# Patient Record
Sex: Male | Born: 1945 | Race: Black or African American | Hispanic: No | Marital: Married | Smoking: Former smoker
Health system: Southern US, Community
[De-identification: ages and names within clinical notes are randomized; demographics above are authoritative.]

## PROBLEM LIST (undated history)

## (undated) DIAGNOSIS — E119 Type 2 diabetes mellitus without complications: Secondary | ICD-10-CM

## (undated) DIAGNOSIS — E785 Hyperlipidemia, unspecified: Secondary | ICD-10-CM

## (undated) DIAGNOSIS — I255 Ischemic cardiomyopathy: Secondary | ICD-10-CM

## (undated) DIAGNOSIS — I10 Essential (primary) hypertension: Secondary | ICD-10-CM

## (undated) DIAGNOSIS — Z95810 Presence of automatic (implantable) cardiac defibrillator: Secondary | ICD-10-CM

## (undated) DIAGNOSIS — I2510 Atherosclerotic heart disease of native coronary artery without angina pectoris: Secondary | ICD-10-CM

## (undated) HISTORY — DX: Atherosclerotic heart disease of native coronary artery without angina pectoris: I25.10

## (undated) HISTORY — DX: Hyperlipidemia, unspecified: E78.5

## (undated) HISTORY — DX: Type 2 diabetes mellitus without complications: E11.9

---

## 1994-02-10 HISTORY — PX: CORONARY ARTERY BYPASS GRAFT: SHX141

## 1998-11-14 ENCOUNTER — Ambulatory Visit (HOSPITAL_COMMUNITY): Admission: RE | Admit: 1998-11-14 | Discharge: 1998-11-14 | Payer: Self-pay | Admitting: *Deleted

## 1998-11-14 ENCOUNTER — Encounter: Payer: Self-pay | Admitting: *Deleted

## 1998-11-14 HISTORY — PX: CARDIAC CATHETERIZATION: SHX172

## 1999-08-27 ENCOUNTER — Emergency Department (HOSPITAL_COMMUNITY): Admission: EM | Admit: 1999-08-27 | Discharge: 1999-08-27 | Payer: Self-pay | Admitting: Emergency Medicine

## 1999-08-28 ENCOUNTER — Encounter: Payer: Self-pay | Admitting: Emergency Medicine

## 2003-04-25 ENCOUNTER — Ambulatory Visit (HOSPITAL_COMMUNITY): Admission: RE | Admit: 2003-04-25 | Discharge: 2003-04-25 | Payer: Self-pay | Admitting: Gastroenterology

## 2007-06-08 HISTORY — PX: CARDIAC DEFIBRILLATOR PLACEMENT: SHX171

## 2009-09-22 ENCOUNTER — Inpatient Hospital Stay (HOSPITAL_COMMUNITY): Admission: EM | Admit: 2009-09-22 | Discharge: 2009-09-28 | Payer: Self-pay

## 2009-09-25 ENCOUNTER — Encounter (INDEPENDENT_AMBULATORY_CARE_PROVIDER_SITE_OTHER): Payer: Self-pay

## 2010-04-25 LAB — COMPREHENSIVE METABOLIC PANEL
ALT: 117 U/L — ABNORMAL HIGH (ref 0–53)
ALT: 131 U/L — ABNORMAL HIGH (ref 0–53)
AST: 43 U/L — ABNORMAL HIGH (ref 0–37)
Albumin: 2.6 g/dL — ABNORMAL LOW (ref 3.5–5.2)
Albumin: 2.8 g/dL — ABNORMAL LOW (ref 3.5–5.2)
Albumin: 3 g/dL — ABNORMAL LOW (ref 3.5–5.2)
Alkaline Phosphatase: 62 U/L (ref 39–117)
BUN: 13 mg/dL (ref 6–23)
BUN: 2 mg/dL — ABNORMAL LOW (ref 6–23)
BUN: 3 mg/dL — ABNORMAL LOW (ref 6–23)
BUN: 6 mg/dL (ref 6–23)
BUN: 9 mg/dL (ref 6–23)
CO2: 28 mEq/L (ref 19–32)
CO2: 30 mEq/L (ref 19–32)
Calcium: 7.7 mg/dL — ABNORMAL LOW (ref 8.4–10.5)
Calcium: 8.1 mg/dL — ABNORMAL LOW (ref 8.4–10.5)
Calcium: 8.1 mg/dL — ABNORMAL LOW (ref 8.4–10.5)
Calcium: 8.4 mg/dL (ref 8.4–10.5)
Chloride: 106 mEq/L (ref 96–112)
Chloride: 99 mEq/L (ref 96–112)
Creatinine, Ser: 0.82 mg/dL (ref 0.4–1.5)
Creatinine, Ser: 1.42 mg/dL (ref 0.4–1.5)
GFR calc Af Amer: >60 mL/min (ref >60)
GFR calc non Af Amer: 51 mL/min — ABNORMAL LOW (ref >60)
GFR calc non Af Amer: >60 mL/min (ref >60)
GFR calc non Af Amer: >60 mL/min (ref >60)
GFR calc non Af Amer: >60 mL/min (ref >60)
Glucose, Bld: 106 mg/dL — ABNORMAL HIGH (ref 70–99)
Glucose, Bld: 94 mg/dL (ref 70–99)
Glucose, Bld: 95 mg/dL (ref 70–99)
Potassium: 4.6 mEq/L (ref 3.5–5.1)
Sodium: 137 mEq/L (ref 135–145)
Sodium: 138 mEq/L (ref 135–145)
Sodium: 139 mEq/L (ref 135–145)
Total Bilirubin: 1.2 mg/dL (ref 0.3–1.2)
Total Bilirubin: 1.5 mg/dL — ABNORMAL HIGH (ref 0.3–1.2)
Total Protein: 5.9 g/dL — ABNORMAL LOW (ref 6.0–8.3)

## 2010-04-25 LAB — CBC
HCT: 36.2 % — ABNORMAL LOW (ref 39.0–52.0)
HCT: 36.9 % — ABNORMAL LOW (ref 39.0–52.0)
HCT: 38.8 % — ABNORMAL LOW (ref 39.0–52.0)
HCT: 41.2 % (ref 39.0–52.0)
Hemoglobin: 12.3 g/dL — ABNORMAL LOW (ref 13.0–17.0)
Hemoglobin: 12.6 g/dL — ABNORMAL LOW (ref 13.0–17.0)
Hemoglobin: 12.6 g/dL — ABNORMAL LOW (ref 13.0–17.0)
Hemoglobin: 13.8 g/dL (ref 13.0–17.0)
MCH: 30.6 pg (ref 26.0–34.0)
MCH: 31.3 pg (ref 26.0–34.0)
MCH: 31.7 pg (ref 26.0–34.0)
MCHC: 33.5 g/dL (ref 30.0–36.0)
MCHC: 34 g/dL (ref 30.0–36.0)
MCHC: 34.3 g/dL (ref 30.0–36.0)
MCV: 90 fL (ref 78.0–100.0)
MCV: 90.9 fL (ref 78.0–100.0)
MCV: 92.8 fL (ref 78.0–100.0)
Platelets: 111 10*3/uL — ABNORMAL LOW (ref 150–400)
Platelets: 118 10*3/uL — ABNORMAL LOW (ref 150–400)
Platelets: 122 10*3/uL — ABNORMAL LOW (ref 150–400)
RBC: 3.97 MIL/uL — ABNORMAL LOW (ref 4.22–5.81)
RBC: 4.4 MIL/uL (ref 4.22–5.81)
RDW: 13.3 % (ref 11.5–15.5)
RDW: 13.3 % (ref 11.5–15.5)
WBC: 6.1 10*3/uL (ref 4.0–10.5)

## 2010-04-25 LAB — DIFFERENTIAL
Basophils Absolute: 0 10*3/uL (ref 0.0–0.1)
Basophils Absolute: 0 10*3/uL (ref 0.0–0.1)
Basophils Absolute: 0 10*3/uL (ref 0.0–0.1)
Basophils Relative: 0 % (ref 0–1)
Basophils Relative: 0 % (ref 0–1)
Basophils Relative: 0 % (ref 0–1)
Eosinophils Absolute: 0.1 10*3/uL (ref 0.0–0.7)
Eosinophils Relative: 2 % (ref 0–5)
Eosinophils Relative: 3 % (ref 0–5)
Lymphocytes Relative: 10 % — ABNORMAL LOW (ref 12–46)
Lymphocytes Relative: 17 % (ref 12–46)
Lymphocytes Relative: 19 % (ref 12–46)
Lymphocytes Relative: 21 % (ref 12–46)
Lymphs Abs: 0.6 10*3/uL — ABNORMAL LOW (ref 0.7–4.0)
Lymphs Abs: 1 10*3/uL (ref 0.7–4.0)
Lymphs Abs: 1 10*3/uL (ref 0.7–4.0)
Monocytes Absolute: 0.5 10*3/uL (ref 0.1–1.0)
Monocytes Absolute: 0.6 10*3/uL (ref 0.1–1.0)
Monocytes Relative: 10 % (ref 3–12)
Monocytes Relative: 12 % (ref 3–12)
Neutro Abs: 2.7 10*3/uL (ref 1.7–7.7)
Neutro Abs: 4 10*3/uL (ref 1.7–7.7)
Neutro Abs: 4.4 10*3/uL (ref 1.7–7.7)
Neutro Abs: 4.4 10*3/uL (ref 1.7–7.7)
Neutrophils Relative %: 70 % (ref 43–77)
Neutrophils Relative %: 72 % (ref 43–77)
Neutrophils Relative %: 81 % — ABNORMAL HIGH (ref 43–77)

## 2010-04-25 LAB — GLUCOSE, CAPILLARY
Glucose-Capillary: 100 mg/dL — ABNORMAL HIGH (ref 70–99)
Glucose-Capillary: 101 mg/dL — ABNORMAL HIGH (ref 70–99)
Glucose-Capillary: 107 mg/dL — ABNORMAL HIGH (ref 70–99)
Glucose-Capillary: 109 mg/dL — ABNORMAL HIGH (ref 70–99)
Glucose-Capillary: 118 mg/dL — ABNORMAL HIGH (ref 70–99)
Glucose-Capillary: 130 mg/dL — ABNORMAL HIGH (ref 70–99)
Glucose-Capillary: 138 mg/dL — ABNORMAL HIGH (ref 70–99)
Glucose-Capillary: 86 mg/dL (ref 70–99)
Glucose-Capillary: 87 mg/dL (ref 70–99)
Glucose-Capillary: 91 mg/dL (ref 70–99)
Glucose-Capillary: 92 mg/dL (ref 70–99)
Glucose-Capillary: 93 mg/dL (ref 70–99)
Glucose-Capillary: 96 mg/dL (ref 70–99)
Glucose-Capillary: 99 mg/dL (ref 70–99)

## 2010-04-25 LAB — TYPE AND SCREEN: ABO/RH(D): O POS

## 2010-04-25 LAB — MAGNESIUM
Magnesium: 1.8 mg/dL (ref 1.5–2.5)
Magnesium: 1.8 mg/dL (ref 1.5–2.5)

## 2010-04-25 LAB — ABO/RH: ABO/RH(D): O POS

## 2010-04-26 LAB — HEPATITIS PANEL, ACUTE
HCV Ab: NEGATIVE
Hepatitis B Surface Ag: NEGATIVE

## 2010-04-26 LAB — GLUCOSE, CAPILLARY
Glucose-Capillary: 105 mg/dL — ABNORMAL HIGH (ref 70–99)
Glucose-Capillary: 126 mg/dL — ABNORMAL HIGH (ref 70–99)
Glucose-Capillary: 134 mg/dL — ABNORMAL HIGH (ref 70–99)
Glucose-Capillary: 88 mg/dL (ref 70–99)
Glucose-Capillary: 98 mg/dL (ref 70–99)

## 2010-04-26 LAB — DIFFERENTIAL
Basophils Relative: 0 % (ref 0–1)
Eosinophils Absolute: 0 10*3/uL (ref 0.0–0.7)
Eosinophils Relative: 0 % (ref 0–5)
Neutrophils Relative %: 88 % — ABNORMAL HIGH (ref 43–77)

## 2010-04-26 LAB — CULTURE, BLOOD (ROUTINE X 2)

## 2010-04-26 LAB — CBC
HCT: 41.5 % (ref 39.0–52.0)
Hemoglobin: 13.6 g/dL (ref 13.0–17.0)
MCHC: 32.8 g/dL (ref 30.0–36.0)
RBC: 4.4 MIL/uL (ref 4.22–5.81)
WBC: 7.9 10*3/uL (ref 4.0–10.5)

## 2010-04-26 LAB — COMPREHENSIVE METABOLIC PANEL
ALT: 354 U/L — ABNORMAL HIGH (ref 0–53)
AST: 196 U/L — ABNORMAL HIGH (ref 0–37)
Alkaline Phosphatase: 83 U/L (ref 39–117)
CO2: 29 mEq/L (ref 19–32)
Chloride: 101 mEq/L (ref 96–112)
GFR calc non Af Amer: 41 mL/min — ABNORMAL LOW (ref >60)
Glucose, Bld: 107 mg/dL — ABNORMAL HIGH (ref 70–99)
Potassium: 4.2 mEq/L (ref 3.5–5.1)
Sodium: 141 mEq/L (ref 135–145)

## 2010-04-26 LAB — MAGNESIUM: Magnesium: 1.7 mg/dL (ref 1.5–2.5)

## 2011-11-21 HISTORY — PX: NM MYOCAR PERF WALL MOTION: HXRAD629

## 2012-01-30 ENCOUNTER — Other Ambulatory Visit (HOSPITAL_COMMUNITY): Payer: Self-pay | Admitting: Cardiovascular Disease

## 2012-01-30 DIAGNOSIS — I509 Heart failure, unspecified: Secondary | ICD-10-CM

## 2012-01-30 DIAGNOSIS — I519 Heart disease, unspecified: Secondary | ICD-10-CM

## 2012-01-30 DIAGNOSIS — I472 Ventricular tachycardia: Secondary | ICD-10-CM

## 2012-02-10 ENCOUNTER — Ambulatory Visit (HOSPITAL_COMMUNITY)
Admission: RE | Admit: 2012-02-10 | Discharge: 2012-02-10 | Disposition: A | Discharge status: Home / Self Care | Payer: Medicare Other | Source: Ambulatory Visit | Attending: Cardiovascular Disease | Admitting: Cardiovascular Disease

## 2012-02-10 DIAGNOSIS — I4729 Other ventricular tachycardia: Secondary | ICD-10-CM | POA: Insufficient documentation

## 2012-02-10 DIAGNOSIS — I509 Heart failure, unspecified: Secondary | ICD-10-CM

## 2012-02-10 DIAGNOSIS — I519 Heart disease, unspecified: Secondary | ICD-10-CM | POA: Insufficient documentation

## 2012-02-10 DIAGNOSIS — I517 Cardiomegaly: Secondary | ICD-10-CM | POA: Insufficient documentation

## 2012-02-10 DIAGNOSIS — I472 Ventricular tachycardia, unspecified: Secondary | ICD-10-CM | POA: Insufficient documentation

## 2012-02-10 DIAGNOSIS — I079 Rheumatic tricuspid valve disease, unspecified: Secondary | ICD-10-CM | POA: Insufficient documentation

## 2012-02-10 DIAGNOSIS — I059 Rheumatic mitral valve disease, unspecified: Secondary | ICD-10-CM | POA: Insufficient documentation

## 2012-02-10 NOTE — Progress Notes (Signed)
2D Echo Performed 02/10/2012    Amina Menchaca, RCS  

## 2012-11-10 ENCOUNTER — Encounter: Payer: Self-pay | Admitting: *Deleted

## 2012-11-11 ENCOUNTER — Encounter: Payer: Self-pay | Admitting: Cardiovascular Disease

## 2012-11-11 ENCOUNTER — Ambulatory Visit (INDEPENDENT_AMBULATORY_CARE_PROVIDER_SITE_OTHER): Payer: Medicare Other | Admitting: Cardiovascular Disease

## 2012-11-11 VITALS — BP 130/70 | HR 63 | Resp 16 | Ht >= 80 in | Wt 192.8 lb

## 2012-11-11 DIAGNOSIS — I10 Essential (primary) hypertension: Secondary | ICD-10-CM

## 2012-11-11 DIAGNOSIS — I255 Ischemic cardiomyopathy: Secondary | ICD-10-CM

## 2012-11-11 DIAGNOSIS — R946 Abnormal results of thyroid function studies: Secondary | ICD-10-CM

## 2012-11-11 DIAGNOSIS — I472 Ventricular tachycardia, unspecified: Secondary | ICD-10-CM

## 2012-11-11 DIAGNOSIS — Z95810 Presence of automatic (implantable) cardiac defibrillator: Secondary | ICD-10-CM

## 2012-11-11 DIAGNOSIS — E119 Type 2 diabetes mellitus without complications: Secondary | ICD-10-CM

## 2012-11-11 DIAGNOSIS — I2510 Atherosclerotic heart disease of native coronary artery without angina pectoris: Secondary | ICD-10-CM

## 2012-11-11 DIAGNOSIS — I509 Heart failure, unspecified: Secondary | ICD-10-CM

## 2012-11-11 DIAGNOSIS — E785 Hyperlipidemia, unspecified: Secondary | ICD-10-CM

## 2012-11-11 DIAGNOSIS — I5022 Chronic systolic (congestive) heart failure: Secondary | ICD-10-CM

## 2012-11-11 DIAGNOSIS — I2589 Other forms of chronic ischemic heart disease: Secondary | ICD-10-CM

## 2012-11-11 DIAGNOSIS — R7989 Other specified abnormal findings of blood chemistry: Secondary | ICD-10-CM

## 2012-11-11 LAB — ICD DEVICE OBSERVATION
BATTERY VOLTAGE: 3.07 V
BRDY-0002RV: 40 {beats}/min
FVT: 0
PACEART VT: 0
RV LEAD IMPEDENCE ICD: 418 Ohm
RV LEAD THRESHOLD: 0.75 V
TZAT-0001SLOWVT: 2
TZAT-0011SLOWVT: 10 ms
TZAT-0013SLOWVT: 3
TZAT-0018SLOWVT: NEGATIVE
TZAT-0018SLOWVT: NEGATIVE
TZON-0003SLOWVT: 359.2 ms
TZON-0003VSLOWVT: 428.5 ms
TZON-0004SLOWVT: 16
TZON-0005SLOWVT: 12
TZST-0001SLOWVT: 5
TZST-0001SLOWVT: 6
TZST-0003SLOWVT: 35 J
TZST-0003SLOWVT: 35 J
VF: 0

## 2012-11-11 NOTE — Patient Instructions (Addendum)
Follow up with home ICD check on 02-13-2012.  Follow up with Dr.C in April 2015.

## 2012-11-11 NOTE — Progress Notes (Signed)
In office ICD interrogation. Normal device function. No changes made this session. 

## 2012-11-12 ENCOUNTER — Ambulatory Visit: Payer: Medicare Other | Admitting: Cardiovascular Disease

## 2012-11-14 DIAGNOSIS — I5022 Chronic systolic (congestive) heart failure: Secondary | ICD-10-CM | POA: Insufficient documentation

## 2012-11-14 DIAGNOSIS — E119 Type 2 diabetes mellitus without complications: Secondary | ICD-10-CM | POA: Insufficient documentation

## 2012-11-14 DIAGNOSIS — Z95810 Presence of automatic (implantable) cardiac defibrillator: Secondary | ICD-10-CM | POA: Insufficient documentation

## 2012-11-14 DIAGNOSIS — I10 Essential (primary) hypertension: Secondary | ICD-10-CM | POA: Insufficient documentation

## 2012-11-14 DIAGNOSIS — I2510 Atherosclerotic heart disease of native coronary artery without angina pectoris: Secondary | ICD-10-CM | POA: Insufficient documentation

## 2012-11-14 DIAGNOSIS — I255 Ischemic cardiomyopathy: Secondary | ICD-10-CM | POA: Insufficient documentation

## 2012-11-14 DIAGNOSIS — R7989 Other specified abnormal findings of blood chemistry: Secondary | ICD-10-CM | POA: Insufficient documentation

## 2012-11-14 DIAGNOSIS — I472 Ventricular tachycardia: Secondary | ICD-10-CM | POA: Insufficient documentation

## 2012-11-14 DIAGNOSIS — E785 Hyperlipidemia, unspecified: Secondary | ICD-10-CM | POA: Insufficient documentation

## 2012-11-14 NOTE — Assessment & Plan Note (Signed)
There is a discrepancy between the left ventricular ejection fraction estimated by nuclear stress testing and echocardiography, despite the fact that these were performed in close proximity to each other. From a clinical standpoint he has excellent functional status (NYHA class I) and this would be in keeping with the better EF found on echo. However, reviewing his nuclear study there is very extensive and severe perfusion deficit in the inferior, lateral and apical distributions, more in keeping with the lower ejection fraction of 26% as estimated by nuclear scintigraphy. Conceivably, this could be explained by severe resting ischemia in the territory of chronic arterial/graft occlusion, but he does not have angina pectoris. He is on appropriate medical therapy with a very high dose of carvedilol and maximum dose of ramipril and requires only a very small amount of loop diuretic to maintain euvolemic. I am not convinced that he needs digoxin therapy, but we'll not change therapy that has worked so well for him to this point.

## 2012-11-14 NOTE — Assessment & Plan Note (Signed)
7 episodes of asymptomatic nonsustained ventricular tachycardia have been recorded by his device since the last device interrogation. He has not had treatment for ventricular arrhythmia since 2011 (antitachycardia pacing). He has frequent episodes of nonsustained ventricular tachycardia but has not had an episode of sustained VT since January of this year when he had a 25 second episode in the monitor zone. No changes were made to his medications or defibrillator settings at this visit

## 2012-11-14 NOTE — Assessment & Plan Note (Signed)
He has extensive native coronary artery disease with evidence of scars in the distribution of the distal LAD, entire circumflex and entire right coronary artery distribution, but had patent grafts in the year 2000 and no reversible changes on his most recent nuclear stress test. He is free of angina. No changes in medications are necessary at this time.

## 2012-11-14 NOTE — Assessment & Plan Note (Signed)
Blood pressure is well controlled on high doses of ACE inhibitor and beta blocker

## 2012-11-14 NOTE — Progress Notes (Signed)
Patient ID: Ronnie Nelson, male   DOB: [birth year], 67 y.o.   MRN: [id number]     Reason for office visit CAD, CHF, VT, ICD followup  This is my first encounter with [name], who has been under the care of Dr. Susa Griffins until Last month.  He has a long-standing history of coronary artery disease and had bypass surgery in 1999. Has not required coronary angiography since the year 2000 when all his grafts were found to be patent. He does not have angina pectoris and a nuclear stress test performed roughly one year ago did not show reversible ischemia, but did show very extensive scar in the distribution of all 3 major coronary arteries.  Left ventricular systolic function is at least mildly depressed. EF was estimated to be 40-45% on an echo performed last December, but a nuclear stress test performed in October of last year showed an ejection fraction of 26%. He does not have overt symptoms of congestive heart failure at this time and is remarkably active. He requires a very low dose of loop diuretic.  He has a defibrillator that was implanted in 2009. It is a single chamber Medtronic device. Therapy has not been delivered since 2011 when he received antitachycardia pacing. I don't think he has ever had a shock. The device does report frequent episodes of nonsustained ventricular tachycardia and occasional episodes of brief sustained VT of up to 25 seconds in duration, usually around 150 beats per minute. These have not been symptomatic.  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    No Known Allergies  Current Outpatient Prescriptions  Medication Sig Dispense Refill  . aspirin 81 MG tablet Take 81 mg by mouth daily.      . carvedilol (COREG) 25 MG tablet Take 75 mg by mouth 2 (two) times daily with a meal.      . digoxin (LANOXIN) 0.125 MG tablet Take  0.125 mg by mouth daily.      Marland Kitchen ezetimibe (ZETIA) 10 MG tablet Take 10 mg by mouth daily.      . furosemide (LASIX) 20 MG tablet Take 20 mg by mouth.      . niacin 500 MG tablet Take 500 mg by mouth daily with breakfast.      . nitroGLYCERIN (NITROSTAT) 0.4 MG SL tablet Place 0.4 mg under the tongue every 5 (five) minutes as needed for chest pain.      Marland Kitchen omeprazole (PRILOSEC) 20 MG capsule Take 20 mg by mouth 2 (two) times daily.       . ramipril (ALTACE) 10 MG capsule Take 10 mg by mouth daily.      . simvastatin (ZOCOR) 80 MG tablet Take 80 mg by mouth at bedtime.      . sucralfate (CARAFATE) 1 G tablet Take 1 g by mouth 4 (four) times daily.       No current facility-administered medications for this visit.    Past Medical History  Diagnosis Date  . DM (diabetes mellitus)   . Hyperlipidemia   . CAD (coronary artery disease)     CABG 1996: SVG to LAD,LIMA to CX,SVG to RCA    Past Surgical History  Procedure Laterality Date  . Coronary artery bypass graft  1996    [facility]  . Cardiac catheterization  11/14/1998    severe depressed LV systolic function EF 20-25%  .  Cardiac defibrillator placement  06/08/07    Medtronic  . Nm myocar perf wall motion  11/21/2011    no significant ischemia    No family history on file.  History   Social History  . Marital Status: Married    Spouse Name: N/A    Number of Children: N/A  . Years of Education: N/A   Occupational History  . Not on file.   Social History Main Topics  . Smoking status: Former Smoker    Quit date: 02/10/1995  . Smokeless tobacco: Not on file  . Alcohol Use: No  . Drug Use: No  . Sexual Activity: Not on file   Other Topics Concern  . Not on file   Social History Narrative  . No narrative on file    Review of systems: The patient specifically denies any chest pain at rest or with exertion, dyspnea at rest or with exertion, orthopnea, paroxysmal nocturnal dyspnea, syncope, palpitations, focal  neurological deficits, intermittent claudication, lower extremity edema, unexplained weight gain, cough, hemoptysis or wheezing.  The patient also denies abdominal pain, nausea, vomiting, dysphagia, diarrhea, constipation, polyuria, polydipsia, dysuria, hematuria, frequency, urgency, abnormal bleeding or bruising, fever, chills, unexpected weight changes, mood swings, change in skin or hair texture, change in voice quality, auditory or visual problems, allergic reactions or rashes, new musculoskeletal complaints other than usual "aches and pains".   PHYSICAL EXAM BP 130/70  Pulse 63  Resp 16  Ht 7\' 5"  (2.261 m)  Wt 192 lb 12.8 oz (87.454 kg)  BMI 17.11 kg/m2  General: Alert, oriented x3, no distress Head: no evidence of trauma, PERRL, EOMI, no exophtalmos or lid lag, no myxedema, no xanthelasma; normal ears, nose and oropharynx Neck: normal jugular venous pulsations and no hepatojugular reflux; brisk carotid pulses without delay and no carotid bruits Chest: clear to auscultation, no signs of consolidation by percussion or palpation, normal fremitus, symmetrical and full respiratory excursions; healthy left subclavian ICD site Cardiovascular: Slight inferolateral displacement of the apical impulse, regular rhythm, normal first and widely split second heart sounds, no murmurs, rubs or gallops Abdomen: no tenderness or distention, no masses by palpation, no abnormal pulsatility or arterial bruits, normal bowel sounds, no hepatosplenomegaly Extremities: no clubbing, cyanosis or edema; 2+ radial, ulnar and brachial pulses bilaterally; 2+ right femoral, posterior tibial and dorsalis pedis pulses; 2+ left femoral, posterior tibial and dorsalis pedis pulses; no subclavian or femoral bruits Neurological: grossly nonfocal   EKG: Sinus rhythm, right bundle branch block, old inferior infarct, extreme right axis deviation, inverted T waves across the entire precordium, unchanged from previous tracings. QRS  duration 148 ms, QTC 448 ms  Lipid Panel  May 2014 LDL 77 (direct), HDL 49, triglycerides 70 Hemoglobin A1c 5.7% Creatinine 1.0, normal liver function tests  TSH 0.177 free T4 1.46    ASSESSMENT AND PLAN Chronic systolic CHF (congestive heart failure) There is a discrepancy between the left ventricular ejection fraction estimated by nuclear stress testing and echocardiography, despite the fact that these were performed in close proximity to each other. From a clinical standpoint he has excellent functional status (NYHA class I) and this would be in keeping with the better EF found on echo. However, reviewing his nuclear study there is very extensive and severe perfusion deficit in the inferior, lateral and apical distributions, more in keeping with the lower ejection fraction of 26% as estimated by nuclear scintigraphy. Conceivably, this could be explained by severe resting ischemia in the territory of chronic arterial/graft occlusion, but  he does not have angina pectoris. He is on appropriate medical therapy with a very high dose of carvedilol and maximum dose of ramipril and requires only a very small amount of loop diuretic to maintain euvolemic. I am not convinced that he needs digoxin therapy, but we'll not change therapy that has worked so well for him to this point.  CAD (coronary artery disease) He has extensive native coronary artery disease with evidence of scars in the distribution of the distal LAD, entire circumflex and entire right coronary artery distribution, but had patent grafts in the year 2000 and no reversible changes on his most recent nuclear stress test. He is free of angina. No changes in medications are necessary at this time.  ICD (implantable cardioverter-defibrillator) in place A single chamber Medtronic defibrillator implant in 2009 is working well. All battery and lead parameters are normal with the exception of a chronically low R wave sensed at only 2.1 mV. No  changes are made in his current device settings. She does not have any ventricular pacing. The heart rate histogram distribution is favorable.  NSVT (nonsustained ventricular tachycardia) 7 episodes of asymptomatic nonsustained ventricular tachycardia have been recorded by his device since the last device interrogation. He has not had treatment for ventricular arrhythmia since 2011 (antitachycardia pacing). He has frequent episodes of nonsustained ventricular tachycardia but has not had an episode of sustained VT since January of this year when he had a 25 second episode in the monitor zone. No changes were made to his medications or defibrillator settings at this visit  Hyperlipidemia All lipid parameters were within the desirable range by a lab results from May of 2014  DM2 (diabetes mellitus, type 2) Excellent control with hemoglobin A1c of 5.7% in May of this year  HTN (hypertension) Blood pressure is well controlled on high doses of ACE inhibitor and beta blocker  Low TSH level This has been consistently present on several assays over the last couple of years, free T4 levels were consistently normal. This is consistent with subclinical hyperthyroidism and should be monitored since overt thyrotoxicosis could lead to significant arrhythmic complications in the setting of severe ischemic cardiomyopathy.  Orders Placed This Encounter  Procedures  . ICD Device Observation  . EKG 12-Lead   Meds ordered this encounter  Medications  . sucralfate (CARAFATE) 1 G tablet    Sig: Take 1 g by mouth 4 (four) times daily.    Junious Silk, MD, [facility] [phone] office [phone] pager

## 2012-11-14 NOTE — Assessment & Plan Note (Signed)
A single chamber Medtronic defibrillator implant in 2009 is working well. All battery and lead parameters are normal with the exception of a chronically low R wave sensed at only 2.1 mV. No changes are made in his current device settings. She does not have any ventricular pacing. The heart rate histogram distribution is favorable.

## 2012-11-14 NOTE — Assessment & Plan Note (Signed)
Excellent control with hemoglobin A1c of 5.7% in May of this year

## 2012-11-14 NOTE — Assessment & Plan Note (Signed)
All lipid parameters were within the desirable range by a lab results from May of 2014

## 2012-11-14 NOTE — Assessment & Plan Note (Signed)
This has been consistently present on several assays over the last couple of years, free T4 levels were consistently normal. This is consistent with subclinical hyperthyroidism and should be monitored since overt thyrotoxicosis could lead to significant arrhythmic complications in the setting of severe ischemic cardiomyopathy.

## 2012-11-15 ENCOUNTER — Encounter: Payer: Self-pay | Admitting: Cardiovascular Disease

## 2012-12-07 ENCOUNTER — Telehealth (HOSPITAL_COMMUNITY): Payer: Self-pay | Admitting: *Deleted

## 2012-12-07 ENCOUNTER — Other Ambulatory Visit (HOSPITAL_COMMUNITY): Payer: Self-pay | Admitting: Cardiovascular Disease

## 2012-12-15 ENCOUNTER — Ambulatory Visit (HOSPITAL_COMMUNITY)
Admission: RE | Admit: 2012-12-15 | Discharge: 2012-12-15 | Disposition: A | Discharge status: Home / Self Care | Payer: Medicare Other | Source: Ambulatory Visit | Attending: Cardiovascular Disease | Admitting: Cardiovascular Disease

## 2012-12-15 DIAGNOSIS — R0989 Other specified symptoms and signs involving the circulatory and respiratory systems: Secondary | ICD-10-CM

## 2012-12-15 DIAGNOSIS — I6529 Occlusion and stenosis of unspecified carotid artery: Secondary | ICD-10-CM | POA: Insufficient documentation

## 2012-12-16 ENCOUNTER — Telehealth: Payer: Self-pay | Admitting: Internal Medicine

## 2012-12-16 NOTE — Telephone Encounter (Signed)
Pt had an appt with dr c 11-11-12/mt

## 2012-12-17 NOTE — Progress Notes (Signed)
Carotid Duplex Completed. Ronnie Nelson, BS, RDMS, RVT  

## 2012-12-29 NOTE — Progress Notes (Signed)
No answer

## 2013-02-09 ENCOUNTER — Other Ambulatory Visit: Payer: Self-pay | Admitting: *Deleted

## 2013-02-09 MED ORDER — CARVEDILOL 25 MG PO TABS: 75.0000 mg | ORAL_TABLET | Freq: Two times a day (BID) | ORAL | Status: DC

## 2013-02-11 ENCOUNTER — Telehealth: Payer: Self-pay | Admitting: Cardiovascular Disease

## 2013-02-11 LAB — MDC_IDC_ENUM_SESS_TYPE_REMOTE
Date Time Interrogation Session: 20150102222418
HighPow Impedance: 43 Ohm
HighPow Impedance: 56 Ohm
Lead Channel Impedance Value: 418 Ohm
Lead Channel Sensing Intrinsic Amplitude: 2.25 mV
Lead Channel Setting Pacing Amplitude: 2.5 V
Lead Channel Setting Sensing Sensitivity: 0.3 mV
MDC IDC MSMT BATTERY VOLTAGE: 3.05 V
MDC IDC SET LEADCHNL RV PACING PULSEWIDTH: 0.4 ms
MDC IDC SET ZONE DETECTION INTERVAL: 320 ms
MDC IDC SET ZONE DETECTION INTERVAL: 360 ms
MDC IDC STAT BRADY RV PERCENT PACED: 0 %
Zone Setting Detection Interval: 430 ms

## 2013-02-11 LAB — ICD DEVICE OBSERVATION

## 2013-02-11 NOTE — Telephone Encounter (Signed)
Was trying to send a reading on his defib ,and wants to know did you receive it .Marland Kitchen Please call    Thanks

## 2013-02-14 ENCOUNTER — Ambulatory Visit (INDEPENDENT_AMBULATORY_CARE_PROVIDER_SITE_OTHER): Payer: Medicare Other | Admitting: *Deleted

## 2013-02-14 DIAGNOSIS — I255 Ischemic cardiomyopathy: Secondary | ICD-10-CM

## 2013-02-14 DIAGNOSIS — I2589 Other forms of chronic ischemic heart disease: Secondary | ICD-10-CM

## 2013-02-15 NOTE — Telephone Encounter (Signed)
Patient aware that transmission was received.

## 2013-02-27 ENCOUNTER — Encounter: Payer: Self-pay | Admitting: *Deleted

## 2013-05-10 ENCOUNTER — Encounter: Payer: Self-pay | Admitting: Cardiovascular Disease

## 2013-05-10 ENCOUNTER — Ambulatory Visit (INDEPENDENT_AMBULATORY_CARE_PROVIDER_SITE_OTHER): Payer: Medicare Other | Admitting: Cardiovascular Disease

## 2013-05-10 VITALS — BP 106/78 | HR 80 | Resp 20 | Ht 67.0 in | Wt 198.1 lb

## 2013-05-10 DIAGNOSIS — I255 Ischemic cardiomyopathy: Secondary | ICD-10-CM

## 2013-05-10 DIAGNOSIS — I2510 Atherosclerotic heart disease of native coronary artery without angina pectoris: Secondary | ICD-10-CM

## 2013-05-10 DIAGNOSIS — I509 Heart failure, unspecified: Secondary | ICD-10-CM

## 2013-05-10 DIAGNOSIS — Z95810 Presence of automatic (implantable) cardiac defibrillator: Secondary | ICD-10-CM

## 2013-05-10 DIAGNOSIS — I5022 Chronic systolic (congestive) heart failure: Secondary | ICD-10-CM

## 2013-05-10 DIAGNOSIS — E785 Hyperlipidemia, unspecified: Secondary | ICD-10-CM

## 2013-05-10 DIAGNOSIS — I472 Ventricular tachycardia: Secondary | ICD-10-CM

## 2013-05-10 DIAGNOSIS — I10 Essential (primary) hypertension: Secondary | ICD-10-CM

## 2013-05-10 DIAGNOSIS — I2589 Other forms of chronic ischemic heart disease: Secondary | ICD-10-CM

## 2013-05-10 DIAGNOSIS — I4729 Other ventricular tachycardia: Secondary | ICD-10-CM

## 2013-05-10 LAB — MDC_IDC_ENUM_SESS_TYPE_INCLINIC
Battery Voltage: 3.04 V
HIGH POWER IMPEDANCE MEASURED VALUE: 47 Ohm
HIGH POWER IMPEDANCE MEASURED VALUE: 60 Ohm
Lead Channel Impedance Value: 418 Ohm
Lead Channel Pacing Threshold Amplitude: 1.25 V
Lead Channel Pacing Threshold Pulse Width: 0.4 ms
Lead Channel Sensing Intrinsic Amplitude: 2.5 mV
Lead Channel Setting Pacing Amplitude: 2.5 V
Lead Channel Setting Pacing Pulse Width: 0.4 ms
Lead Channel Setting Sensing Sensitivity: 0.3 mV
MDC IDC SET ZONE DETECTION INTERVAL: 430 ms
MDC IDC STAT BRADY RV PERCENT PACED: 0 %
Zone Setting Detection Interval: 320 ms
Zone Setting Detection Interval: 360 ms

## 2013-05-10 LAB — PACEMAKER DEVICE OBSERVATION

## 2013-05-10 NOTE — Patient Instructions (Signed)
Remote monitoring is used to monitor your Pacemaker of ICD from home. This monitoring reduces the number of office visits required to check your device to one time per year. It allows [place] to keep an eye on the functioning of your device to ensure it is working properly. You are scheduled for a device check from home on August 10, 2013. You may send your transmission at any time that day. If you have a wireless device, the transmission will be sent automatically. After your physician reviews your transmission, you will receive a postcard with your next transmission date.  Your physician recommends that you schedule a follow-up appointment in: 6 months with Dr. Sallyanne Kuster.

## 2013-05-12 ENCOUNTER — Encounter: Payer: Self-pay | Admitting: Cardiovascular Disease

## 2013-05-12 NOTE — Progress Notes (Signed)
Patient ID: Ronnie Nelson, male   DOB: 26-Oct-1945, 68 y.o.   MRN: [id number]     Reason for office visit Followup CAD, CHF, ICD  Thomos seems to be doing well. He has no complaints today. He is here to followup on his defibrillator and also for congestive heart failure and CAD.  Since his last appointment he has not had problems with shortness of breath or angina pectoris either at rest or with exertion. He has not had any defibrillator shocks interrogation of his device today does not show any recent major arrhythmic events. As before he has had several very brief episodes of nonsustained ventricular tachycardia.  He has a long-standing history of coronary artery disease and had bypass surgery in 1999. Has not required coronary angiography since the year 2000 when all his grafts were found to be patent. He does not have angina pectoris and a nuclear stress test performed roughly one year ago did not show reversible ischemia, but did show very extensive scar in the distribution of all 3 major coronary arteries.  Left ventricular systolic function is at least mildly depressed. EF was estimated to be 40-45% on an echo performed last December, but a nuclear stress test performed in October of last year showed an ejection fraction of 26%. He does not have overt symptoms of congestive heart failure at this time and is remarkably active. He requires a very low dose of loop diuretic.  He has a defibrillator that was implanted in 2009. It is a single chamber Medtronic device. Therapy has not been delivered since 2011 when he received antitachycardia pacing. I don't think he has ever had a shock. The device does report frequent episodes of nonsustained ventricular tachycardia and occasional episodes of brief sustained VT of up to 25 seconds in duration, usually around 150 beats per minute. These have not been symptomatic.  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    No Known Allergies  Current Outpatient Prescriptions  Medication Sig Dispense Refill  . aspirin 81 MG tablet Take 81 mg by mouth daily.      . carvedilol (COREG) 25 MG tablet Take 3 tablets (75 mg total) by mouth 2 (two) times daily with a meal.  180 tablet  6  . digoxin (LANOXIN) 0.125 MG tablet Take 0.0625 mg by mouth daily.       Marland Kitchen ezetimibe (ZETIA) 10 MG tablet Take 10 mg by mouth daily.      . furosemide (LASIX) 20 MG tablet Take 20 mg by mouth.      . niacin 500 MG tablet Take 500 mg by mouth daily with breakfast.      . nitroGLYCERIN (NITROSTAT) 0.4 MG SL tablet Place 0.4 mg under the tongue every 5 (five) minutes as needed for chest pain.      Marland Kitchen omeprazole (PRILOSEC) 20 MG capsule Take 20 mg by mouth 2 (two) times daily.       . ramipril (ALTACE) 10 MG capsule Take 10 mg by mouth daily.      . simvastatin (ZOCOR) 80 MG tablet Take 80 mg by mouth at bedtime.      . sucralfate (CARAFATE) 1 G tablet Take 1 g by mouth 4 (four) times daily.       No current facility-administered medications for this visit.    Past Medical History  Diagnosis Date  . DM (diabetes  mellitus)   . Hyperlipidemia   . CAD (coronary artery disease)     CABG 1996: SVG to LAD,LIMA to CX,SVG to RCA    Past Surgical History  Procedure Laterality Date  . Coronary artery bypass graft  [employer]  . Cardiac catheterization  11/14/1998    severe depressed LV systolic function EF 14-78%  . Cardiac defibrillator placement  06/08/07    Medtronic  . Nm myocar perf wall motion  11/21/2011    no significant ischemia    No family history on file.  History   Social History  . Marital Status: Married    Spouse Name: N/A    Number of Children: N/A  . Years of Education: N/A   Occupational History  . Not on file.   Social History Main Topics  . Smoking status: Former Smoker    Quit date: 02/10/1995  .  Smokeless tobacco: Not on file  . Alcohol Use: No  . Drug Use: No  . Sexual Activity: Not on file   Other Topics Concern  . Not on file   Social History Narrative  . No narrative on file    Review of systems: The patient specifically denies any chest pain at rest or with exertion, dyspnea at rest or with exertion, orthopnea, paroxysmal nocturnal dyspnea, syncope, palpitations, focal neurological deficits, intermittent claudication, lower extremity edema, unexplained weight gain, cough, hemoptysis or wheezing.  The patient also denies abdominal pain, nausea, vomiting, dysphagia, diarrhea, constipation, polyuria, polydipsia, dysuria, hematuria, frequency, urgency, abnormal bleeding or bruising, fever, chills, unexpected weight changes, mood swings, change in skin or hair texture, change in voice quality, auditory or visual problems, allergic reactions or rashes, new musculoskeletal complaints other than usual "aches and pains".      PHYSICAL EXAM BP 106/78  Pulse 80  Resp 20  Ht '5\' 7"'  (1.702 m)  Wt 89.858 kg (198 lb 1.6 oz)  BMI 31.02 kg/m2 General: Alert, oriented x3, no distress  Head: no evidence of trauma, PERRL, EOMI, no exophtalmos or lid lag, no myxedema, no xanthelasma; normal ears, nose and oropharynx  Neck: normal jugular venous pulsations and no hepatojugular reflux; brisk carotid pulses without delay and no carotid bruits  Chest: clear to auscultation, no signs of consolidation by percussion or palpation, normal fremitus, symmetrical and full respiratory excursions; healthy left subclavian ICD site  Cardiovascular: Slight inferolateral displacement of the apical impulse, regular rhythm, normal first and widely split second heart sounds, no murmurs, rubs or gallops  Abdomen: no tenderness or distention, no masses by palpation, no abnormal pulsatility or arterial bruits, normal bowel sounds, no hepatosplenomegaly  Extremities: no clubbing, cyanosis or edema; 2+ radial, ulnar  and brachial pulses bilaterally; 2+ right femoral, posterior tibial and dorsalis pedis pulses; 2+ left femoral, posterior tibial and dorsalis pedis pulses; no subclavian or femoral bruits  Neurological: grossly nonfocal   EKG: Sinus rhythm, right bundle branch block, old inferior infarct, extreme right axis deviation, inverted T waves across the entire precordium, unchanged from previous tracings.    Lipid Panel  Lab is sent from [facility] performed last month did not include a lipid profile. Liver function tests were normal. Creatinine was 1.1. Electrolytes were normal. Hemoglobin 14.7  BMET    Component Value Date/Time   NA 141 09/28/2009 0614   K 3.4* 09/28/2009 0614   CL 106 09/28/2009 0614   CO2 30 09/28/2009 0614   GLUCOSE 106* 09/28/2009 0614   BUN 3*  09/28/2009 0614   CREATININE 0.82 09/28/2009 0614   CALCIUM 8.4 09/28/2009 0614   GFRNONAA >60 09/28/2009 0614   GFRAA  Value: >60        The eGFR has been calculated using the MDRD equation. This calculation has not been validated in all clinical situations. eGFR's persistently <60 mL/min signify possible Chronic Kidney Disease. 09/28/2009 0614     ASSESSMENT AND PLAN  Despite extensive coronary artery disease and cardiomyopathy, Afshin feels well and is very active. He appears to have NYHA functional class I. His defibrillator was fully interrogated today and is functioning normally. No permanent reprogramming changes were performed. He will perform a remote download on his defibrillator 3 months he'll be seen back in the clinic in 6 months. Need to find out whether he has actually had a recent lipid profile. If not this should be ordered. Target LDL cholesterol less than 70.  Patient Instructions  Remote monitoring is used to monitor your Pacemaker of ICD from home. This monitoring reduces the number of office visits required to check your device to one time per year. It allows [place] to keep an eye on the functioning  of your device to ensure it is working properly. You are scheduled for a device check from home on August 10, 2013. You may send your transmission at any time that day. If you have a wireless device, the transmission will be sent automatically. After your physician reviews your transmission, you will receive a postcard with your next transmission date.  Your physician recommends that you schedule a follow-up appointment in: 6 months with Dr. Sallyanne Kuster.       Orders Placed This Encounter  Procedures  . EKG 12-Lead   No orders of the defined types were placed in this encounter.    Holli Humbles, MD, [place] [phone] office [phone] pager

## 2013-05-18 ENCOUNTER — Telehealth: Payer: Self-pay | Admitting: *Deleted

## 2013-05-18 DIAGNOSIS — E782 Mixed hyperlipidemia: Secondary | ICD-10-CM

## 2013-05-18 NOTE — Telephone Encounter (Signed)
Message copied by Tressa Busman on Wed May 18, 2013  9:02 AM ------      Message from: Sanda Klein      Created: Thu May 12, 2013  8:37 PM       The labs sent from [facility] did not include a lipid profile. Please check if he had one done in the last 6 months. If not please have them do one. ------

## 2013-05-18 NOTE — Telephone Encounter (Signed)
States he has not had blood work done anywhere since labs done at [facility].  Will order and mail to patient.  Instructed to go to the lab at his convenience FASTING.  Voiced understanding.

## 2013-08-10 ENCOUNTER — Encounter: Payer: Self-pay | Admitting: Cardiovascular Disease

## 2013-08-10 ENCOUNTER — Ambulatory Visit (INDEPENDENT_AMBULATORY_CARE_PROVIDER_SITE_OTHER): Payer: Medicare Other | Admitting: *Deleted

## 2013-08-10 DIAGNOSIS — I2589 Other forms of chronic ischemic heart disease: Secondary | ICD-10-CM

## 2013-08-10 DIAGNOSIS — I255 Ischemic cardiomyopathy: Secondary | ICD-10-CM

## 2013-08-10 NOTE — Progress Notes (Signed)
Remote ICD transmission.   

## 2013-08-19 LAB — MDC_IDC_ENUM_SESS_TYPE_REMOTE
Brady Statistic RV Percent Paced: 0 %
Date Time Interrogation Session: 20150701132634
HIGH POWER IMPEDANCE MEASURED VALUE: 58 Ohm
HighPow Impedance: 48 Ohm
Lead Channel Sensing Intrinsic Amplitude: 2.25 mV
Lead Channel Sensing Intrinsic Amplitude: 2.25 mV
Lead Channel Setting Pacing Amplitude: 2.5 V
Lead Channel Setting Sensing Sensitivity: 0.3 mV
MDC IDC MSMT BATTERY VOLTAGE: 3.02 V
MDC IDC MSMT LEADCHNL RV IMPEDANCE VALUE: 380 Ohm
MDC IDC SET LEADCHNL RV PACING PULSEWIDTH: 0.4 ms
MDC IDC SET ZONE DETECTION INTERVAL: 430 ms
Zone Setting Detection Interval: 320 ms
Zone Setting Detection Interval: 360 ms

## 2013-08-30 ENCOUNTER — Encounter: Payer: Self-pay | Admitting: Cardiology

## 2013-10-26 ENCOUNTER — Other Ambulatory Visit: Payer: Self-pay | Admitting: Cardiology

## 2013-10-27 NOTE — Telephone Encounter (Signed)
Rx was sent to pharmacy electronically. 

## 2013-12-06 ENCOUNTER — Ambulatory Visit (INDEPENDENT_AMBULATORY_CARE_PROVIDER_SITE_OTHER): Payer: Medicare Other | Admitting: Cardiovascular Disease

## 2013-12-06 ENCOUNTER — Encounter: Payer: Self-pay | Admitting: Cardiovascular Disease

## 2013-12-06 VITALS — BP 110/78 | HR 61 | Resp 16 | Ht 67.0 in | Wt 199.4 lb

## 2013-12-06 DIAGNOSIS — Z95810 Presence of automatic (implantable) cardiac defibrillator: Secondary | ICD-10-CM

## 2013-12-06 DIAGNOSIS — I472 Ventricular tachycardia: Secondary | ICD-10-CM

## 2013-12-06 DIAGNOSIS — I25810 Atherosclerosis of coronary artery bypass graft(s) without angina pectoris: Secondary | ICD-10-CM

## 2013-12-06 DIAGNOSIS — I5022 Chronic systolic (congestive) heart failure: Secondary | ICD-10-CM

## 2013-12-06 DIAGNOSIS — I10 Essential (primary) hypertension: Secondary | ICD-10-CM

## 2013-12-06 DIAGNOSIS — E785 Hyperlipidemia, unspecified: Secondary | ICD-10-CM

## 2013-12-06 DIAGNOSIS — I4729 Other ventricular tachycardia: Secondary | ICD-10-CM

## 2013-12-06 DIAGNOSIS — I2510 Atherosclerotic heart disease of native coronary artery without angina pectoris: Secondary | ICD-10-CM

## 2013-12-06 DIAGNOSIS — I255 Ischemic cardiomyopathy: Secondary | ICD-10-CM

## 2013-12-06 LAB — MDC_IDC_ENUM_SESS_TYPE_INCLINIC
Battery Voltage: 3.02 V
Brady Statistic RV Percent Paced: 0 %
Date Time Interrogation Session: 20151027132117
HIGH POWER IMPEDANCE MEASURED VALUE: 58 Ohm
HighPow Impedance: 45 Ohm
Lead Channel Pacing Threshold Amplitude: 1 V
Lead Channel Pacing Threshold Pulse Width: 0.4 ms
Lead Channel Sensing Intrinsic Amplitude: 1.875 mV
Lead Channel Setting Pacing Amplitude: 2.5 V
Lead Channel Setting Pacing Pulse Width: 0.4 ms
Lead Channel Setting Sensing Sensitivity: 0.3 mV
MDC IDC MSMT LEADCHNL RV IMPEDANCE VALUE: 380 Ohm
MDC IDC MSMT LEADCHNL RV SENSING INTR AMPL: 2.375 mV
MDC IDC SET ZONE DETECTION INTERVAL: 360 ms
Zone Setting Detection Interval: 320 ms
Zone Setting Detection Interval: 430 ms

## 2013-12-06 NOTE — Progress Notes (Signed)
Patient ID: Ronnie Nelson, male   DOB: [birth year], 68 y.o.   MRN: [id number]      Reason for office visit Followup CAD, CHF, ICD   Sharief seems to be doing well. He has no complaints today. He is here to followup on his defibrillator and also for congestive heart failure and CAD.   Since his last appointment he has not had problems with shortness of breath or angina pectoris either at rest or with exertion. He has not had any defibrillator shocks. His good subjective status is confirmed by objective evidence from his defibrillator that he is active 4.6 hours per day on the average and his thoracic impedance is within normal range.   Interrogation of his device today does not show any recent major arrhythmic events. As before he has had several very brief episodes of nonsustained ventricular tachycardia. A total of 17 episodes of nonsustained VT, maximum 11 beats have been recorded.  His right ventricular lead has poor sensing chronically and is slowly deteriorating, in fact his R-wave now has decreased to 1.9 mV.   He has a long-standing history of coronary artery disease and had bypass surgery in 1999. Has not required coronary angiography since the year 2000 when all his grafts were found to be patent. He does not have angina pectoris and a nuclear stress test performed roughly one year ago did not show reversible ischemia, but did show very extensive scar in the distribution of all 3 major coronary arteries.  Left ventricular systolic function is at least mildly depressed. EF was estimated to be 40-45% on an echo performed last December, but a nuclear stress test performed in October of last year showed an ejection fraction of 26%. He does not have overt symptoms of congestive heart failure at this time and is remarkably active. He requires a very low dose of loop diuretic.  He has a defibrillator that was implanted in 2009. It is a single chamber Medtronic device. Therapy has not been delivered  since 2011 when he received antitachycardia pacing. I don't think he has ever had a shock. The device does report frequent episodes of nonsustained ventricular tachycardia and occasional episodes of brief sustained VT of up to 25 seconds in duration, usually around 150 beats per minute. These have not been symptomatic.  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        No Known Allergies  Current Outpatient Prescriptions  Medication Sig Dispense Refill  . aspirin 81 MG tablet Take 81 mg by mouth daily.      . carvedilol (COREG) 25 MG tablet TAKE 3 TABLETS (75 MG TOTAL) BY MOUTH 2 (TWO) TIMES DAILY WITH A MEAL.  180 tablet  6  . digoxin (LANOXIN) 0.125 MG tablet Take 0.0625 mg by mouth daily.       Marland Kitchen ezetimibe (ZETIA) 10 MG tablet Take 10 mg by mouth daily.      . furosemide (LASIX) 20 MG tablet Take 20 mg by mouth.      . niacin 500 MG tablet Take 500 mg by mouth daily with breakfast.      . nitroGLYCERIN (NITROSTAT) 0.4 MG SL tablet Place 0.4 mg under the tongue every 5 (five) minutes as needed for chest pain.      Marland Kitchen omeprazole (PRILOSEC) 20 MG capsule Take 20 mg by mouth 2 (two) times daily.       Marland Kitchen  ramipril (ALTACE) 10 MG capsule Take 10 mg by mouth daily.      . simvastatin (ZOCOR) 80 MG tablet Take 80 mg by mouth at bedtime.      . sucralfate (CARAFATE) 1 G tablet Take 1 g by mouth 4 (four) times daily.       No current facility-administered medications for this visit.    Past Medical History  Diagnosis Date  . DM (diabetes mellitus)   . Hyperlipidemia   . CAD (coronary artery disease)     CABG 1996: SVG to LAD,LIMA to CX,SVG to RCA    Past Surgical History  Procedure Laterality Date  . Coronary artery bypass graft  [place]  . Cardiac catheterization  11/14/1998    severe depressed LV systolic function EF 16-10%  . Cardiac  defibrillator placement  06/08/07    Medtronic  . Nm myocar perf wall motion  11/21/2011    no significant ischemia    No family history on file.  History   Social History  . Marital Status: Married    Spouse Name: N/A    Number of Children: N/A  . Years of Education: N/A   Occupational History  . Not on file.   Social History Main Topics  . Smoking status: Former Smoker    Quit date: 02/10/1995  . Smokeless tobacco: Not on file  . Alcohol Use: No  . Drug Use: No  . Sexual Activity: Not on file   Other Topics Concern  . Not on file   Social History Narrative  . No narrative on file    Review of systems: The patient specifically denies any chest pain at rest or with exertion, dyspnea at rest or with exertion, orthopnea, paroxysmal nocturnal dyspnea, syncope, palpitations, focal neurological deficits, intermittent claudication, lower extremity edema, unexplained weight gain, cough, hemoptysis or wheezing.  The patient also denies abdominal pain, nausea, vomiting, dysphagia, diarrhea, constipation, polyuria, polydipsia, dysuria, hematuria, frequency, urgency, abnormal bleeding or bruising, fever, chills, unexpected weight changes, mood swings, change in skin or hair texture, change in voice quality, auditory or visual problems, allergic reactions or rashes, new musculoskeletal complaints other than usual "aches and pains".   PHYSICAL EXAM BP 110/78  Pulse 61  Resp 16  Ht _0  (1.702 m)  Wt 199 lb 6.4 oz (90.447 kg)  BMI 31.22 kg/m2 General: Alert, oriented x3, no distress  Head: no evidence of trauma, PERRL, EOMI, no exophtalmos or lid lag, no myxedema, no xanthelasma; normal ears, nose and oropharynx  Neck: normal jugular venous pulsations and no hepatojugular reflux; brisk carotid pulses without delay and no carotid bruits  Chest: clear to auscultation, no signs of consolidation by percussion or palpation, normal fremitus, symmetrical and full respiratory excursions;  healthy left subclavian ICD site  Cardiovascular: Slight inferolateral displacement of the apical impulse, regular rhythm, normal first and widely split second heart sounds, no murmurs, rubs or gallops  Abdomen: no tenderness or distention, no masses by palpation, no abnormal pulsatility or arterial bruits, normal bowel sounds, no hepatosplenomegaly  Extremities: no clubbing, cyanosis or edema; 2+ radial, ulnar and brachial pulses bilaterally; 2+ right femoral, posterior tibial and dorsalis pedis pulses; 2+ left femoral, posterior tibial and dorsalis pedis pulses; no subclavian or femoral bruits  Neurological: grossly nonfocal  EKG: Normal sinus rhythm, left atrial abnormality, right bundle branch block, left anterior fascicular block, inferior Q waves, chronic T-wave inversion in leads V4-V6  Lipid Panel  April 2015 total cholesterol  111, triglycerides 57, HDL 46, LDL 54 [facility])   BMET    Component Value Date/Time   NA 141 09/28/2009 0614   K 3.4* 09/28/2009 0614   CL 106 09/28/2009 0614   CO2 30 09/28/2009 0614   GLUCOSE 106* 09/28/2009 0614   BUN 3* 09/28/2009 0614   CREATININE 0.82 09/28/2009 0614   CALCIUM 8.4 09/28/2009 0614   GFRNONAA >60 09/28/2009 0614   GFRAA  Value: >60        The eGFR has been calculated using the MDRD equation. This calculation has not been validated in all clinical situations. eGFR's persistently <60 mL/min signify possible Chronic Kidney Disease. 09/28/2009 0614     ASSESSMENT AND PLAN Chronic systolic CHF (congestive heart failure)   From a clinical standpoint he has excellent functional status (NYHA class I) and this would be in keeping with the better EF found on echo. However, reviewing his nuclear study there is very extensive and severe perfusion deficit in the inferior, lateral and apical distributions, more in keeping with the lower ejection fraction of 26% as estimated by nuclear scintigraphy.  He is on appropriate medical therapy with a very  high dose of carvedilol and maximum dose of ramipril and requires only a very small amount of loop diuretic to maintain euvolemia. I am not convinced that he needs digoxin therapy, but we'll not change therapy that has worked so well for him to this point.   CAD (coronary artery disease)  No angina pectoris. He has extensive native coronary artery disease with evidence of scars in the distribution of the distal LAD, entire circumflex and entire right coronary artery distribution, but had patent grafts in the year 2000 and no reversible changes on his most recent nuclear stress test. He is free of angina. No changes in medications are necessary at this time.   ICD (implantable cardioverter-defibrillator) in place  A single chamber Medtronic defibrillator implant in 2009. Chronically low and worsening R wave sensed at only 1.9. If this worsens any further, probably need to consider lead revision. No changes are made in his current device settings.He does not require ventricular pacing. The heart rate histogram distribution is favorable.   NSVT (nonsustained ventricular tachycardia)  17 episodes of asymptomatic nonsustained ventricular tachycardia have been recorded by his device since the last device interrogation. He has not had treatment for ventricular arrhythmia since 2011 (antitachycardia pacing). He has frequent episodes of nonsustained ventricular tachycardia but has not had an episode of sustained VT since January of this year when he had a 25 second episode in the monitor zone. No changes were made to his medications or defibrillator settings at this visit   Hyperlipidemia  All lipid parameters were within the desirable range by a lab results from May of 2014   DM2 (diabetes mellitus, type 2)  Excellent control with hemoglobin A1c of 5.7% in May of this year   HTN (hypertension)  Blood pressure is well controlled on high doses of ACE inhibitor and beta blocker   Low TSH level  This has  been consistently present on several assays over the last couple of years, free T4 levels were consistently normal. This is consistent with subclinical hyperthyroidism and should be monitored since overt thyrotoxicosis could lead to significant arrhythmic complications in the setting of severe ischemic cardiomyopathy. by mouth has had labs with his primary care physician, Dr. Monia Pouch and will try to retrieve those  Orders Placed This Encounter  Procedures  . Implantable device check  No orders of the defined types were placed in this encounter.    Holli Humbles, MD, [place] [phone] office [phone] pager

## 2013-12-06 NOTE — Patient Instructions (Signed)
Remote monitoring is used to monitor your ICD from home. This monitoring reduces the number of office visits required to check your device to one time per year. It allows [place] to keep an eye on the functioning of your device to ensure it is working properly. You are scheduled for a device check from home on 03-09-2014. You may send your transmission at any time that day. If you have a wireless device, the transmission will be sent automatically. After your physician reviews your transmission, you will receive a postcard with your next transmission date.  .Your physician recommends that you schedule a follow-up appointment in: [place]

## 2014-03-09 ENCOUNTER — Encounter: Payer: Self-pay | Admitting: Cardiovascular Disease

## 2014-03-09 ENCOUNTER — Ambulatory Visit (INDEPENDENT_AMBULATORY_CARE_PROVIDER_SITE_OTHER): Payer: Medicare Other | Admitting: *Deleted

## 2014-03-09 ENCOUNTER — Telehealth: Payer: Self-pay | Admitting: Cardiovascular Disease

## 2014-03-09 DIAGNOSIS — I255 Ischemic cardiomyopathy: Secondary | ICD-10-CM

## 2014-03-09 NOTE — Telephone Encounter (Signed)
Please call.

## 2014-03-09 NOTE — Telephone Encounter (Signed)
Informed pt that remote was received.

## 2014-03-09 NOTE — Progress Notes (Signed)
Remote ICD transmission.   

## 2014-03-10 LAB — MDC_IDC_ENUM_SESS_TYPE_REMOTE
Battery Voltage: 2.98 V
Brady Statistic RV Percent Paced: 0 %
Date Time Interrogation Session: 20160128072606
HighPow Impedance: 48 Ohm
HighPow Impedance: 61 Ohm
Lead Channel Sensing Intrinsic Amplitude: 2.75 mV
Lead Channel Setting Pacing Amplitude: 2.5 V
Lead Channel Setting Sensing Sensitivity: 0.3 mV
MDC IDC MSMT LEADCHNL RV IMPEDANCE VALUE: 418 Ohm
MDC IDC SET LEADCHNL RV PACING PULSEWIDTH: 0.4 ms
MDC IDC SET ZONE DETECTION INTERVAL: 320 ms
Zone Setting Detection Interval: 360 ms
Zone Setting Detection Interval: 430 ms

## 2014-03-22 ENCOUNTER — Encounter: Payer: Self-pay | Admitting: Cardiology

## 2014-04-14 ENCOUNTER — Telehealth: Payer: Self-pay | Admitting: Cardiology

## 2014-04-14 NOTE — Telephone Encounter (Signed)
LMOVM for pt to return call 

## 2014-04-17 NOTE — Telephone Encounter (Signed)
LMOVM for pt to return call. 2nd attempt 

## 2014-04-20 ENCOUNTER — Telehealth: Payer: Self-pay | Admitting: Cardiovascular Disease

## 2014-04-20 NOTE — Telephone Encounter (Signed)
NP calling from [place] because ENT has scheduled brain MRI. 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  Scheduled for 3/15. She explained their facility protocol for MRI includes cardiologist at bedside during scan. Pt has Medtronic Maximo II. ERI reached on 3/4.   She wanted to inform Dr. Sallyanne Kuster this was coming up. He has a pacer check scheduled w/ [place] on 3/17.

## 2014-04-27 ENCOUNTER — Other Ambulatory Visit: Payer: Self-pay | Admitting: *Deleted

## 2014-04-27 ENCOUNTER — Encounter: Payer: Self-pay | Admitting: Cardiovascular Disease

## 2014-04-27 ENCOUNTER — Ambulatory Visit (INDEPENDENT_AMBULATORY_CARE_PROVIDER_SITE_OTHER): Payer: Medicare Other | Admitting: Cardiovascular Disease

## 2014-04-27 VITALS — BP 106/72 | HR 70 | Resp 16 | Ht 67.0 in | Wt 204.9 lb

## 2014-04-27 DIAGNOSIS — Z4502 Encounter for adjustment and management of automatic implantable cardiac defibrillator: Secondary | ICD-10-CM | POA: Diagnosis not present

## 2014-04-27 DIAGNOSIS — I255 Ischemic cardiomyopathy: Secondary | ICD-10-CM | POA: Diagnosis not present

## 2014-04-27 DIAGNOSIS — Z7901 Long term (current) use of anticoagulants: Secondary | ICD-10-CM

## 2014-04-27 DIAGNOSIS — R5383 Other fatigue: Secondary | ICD-10-CM

## 2014-04-27 DIAGNOSIS — I5022 Chronic systolic (congestive) heart failure: Secondary | ICD-10-CM

## 2014-04-27 DIAGNOSIS — Z79899 Other long term (current) drug therapy: Secondary | ICD-10-CM

## 2014-04-27 LAB — MDC_IDC_ENUM_SESS_TYPE_INCLINIC
Battery Voltage: 2.6 V
Date Time Interrogation Session: 20160317125551
HIGH POWER IMPEDANCE MEASURED VALUE: 46 Ohm
HIGH POWER IMPEDANCE MEASURED VALUE: 59 Ohm
Lead Channel Impedance Value: 380 Ohm
Lead Channel Pacing Threshold Amplitude: 1 V
Lead Channel Sensing Intrinsic Amplitude: 2.125 mV
Lead Channel Sensing Intrinsic Amplitude: 2.375 mV
Lead Channel Setting Sensing Sensitivity: 0.3 mV
MDC IDC MSMT LEADCHNL RV PACING THRESHOLD PULSEWIDTH: 0.4 ms
MDC IDC SET LEADCHNL RV PACING AMPLITUDE: 2.5 V
MDC IDC SET LEADCHNL RV PACING PULSEWIDTH: 0.4 ms
MDC IDC STAT BRADY RV PERCENT PACED: 0 %
Zone Setting Detection Interval: 320 ms
Zone Setting Detection Interval: 360 ms
Zone Setting Detection Interval: 430 ms

## 2014-04-27 NOTE — Patient Instructions (Signed)
Your physician recommends that you return for lab work in: 5-7 days prior to procedure.  Your physician has recommended that you have a defibrillator battery change out - MEDTRONIC MRI COMPATIBLE WITH NEW RV LEAD.  This is done in the hospital and usually requires an overnight stay. Please see the instruction sheet given to you today for more information.

## 2014-04-27 NOTE — Progress Notes (Signed)
Patient ID: Ronnie Nelson, male   DOB: Jun 01, 1945, 69 y.o.   MRN: [id number]     Cardiology Office Note   Date:  04/28/2014   ID:  Ronnie Nelson, DOB [birth year], MRN [id number]  PCP:  Coy Saunas, MD  Cardiologist:   Sanda Klein, MD   Chief Complaint  Patient presents with  . Follow-up    pt denied chest pain and SOB, pt was having an MRI last tuesday for his right ear which was cancel because od Pacer      History of Present Illness: Ronnie Nelson is a 69 y.o. male who presents for ICD follow-up. Surprisingly his single-chamber defibrillator has reached elective replacement indicator at less than 5 years since implantation. There was an unexpected marked drop in battery voltage (2.61 V, ERI 2.63 V). In addition, he has had chronically poor sensing of R waves (measured around 2 mV).  There was a plan for him to have a head MRI at [facility] to evaluate his hearing problems, I think due to suspicion of a possible acoustic neuroma. This was abandoned because of his device.  From a symptom standpoint he feels great. He denies shortness of breath or chest pain. He is active 4/5 hours per day.  He has not had any episodes of detected ventricular tachycardia or any ventricular pacing.   He has a long-standing history of coronary artery disease and had bypass surgery in 1999. Has not required coronary angiography since the year 2000 when all his grafts were found to be patent. He does not have angina pectoris and a nuclear stress test performed roughly one year ago did not show reversible ischemia, but did show very extensive scar in the distribution of all 3 major coronary arteries.  Left ventricular systolic function is at least mildly depressed. EF was estimated to be 40-45% on an echo performed last December, but a nuclear stress test performed in October of last year showed an ejection fraction of 26%. He does not have overt symptoms of congestive heart failure at this time  and is remarkably active. He requires a very low dose of loop diuretic.  He has a defibrillator that was implanted in 2009. It is a single chamber Medtronic device. Therapy has not been delivered since 2011 when he received antitachycardia pacing. I don't think he has ever had a shock. The device does report frequent episodes of nonsustained ventricular tachycardia and occasional episodes of brief sustained VT of up to 25 seconds in duration, usually around 150 beats per minute. These have not been symptomatic.  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   Past Medical History  Diagnosis Date  . DM (diabetes mellitus)   . Hyperlipidemia   . CAD (coronary artery disease)     CABG 1996: SVG to LAD,LIMA to CX,SVG to RCA    Past Surgical History  Procedure Laterality Date  . Coronary artery bypass graft  [place]  . Cardiac catheterization  11/14/1998    severe depressed LV systolic function EF 82-42%  . Cardiac defibrillator placement  06/08/07    Medtronic  . Nm myocar perf wall motion  11/21/2011    no significant ischemia     Current Outpatient Prescriptions  Medication Sig Dispense Refill  . aspirin 81 MG tablet Take 81 mg by mouth daily.    . carvedilol (  COREG) 25 MG tablet TAKE 3 TABLETS (75 MG TOTAL) BY MOUTH 2 (TWO) TIMES DAILY WITH A MEAL. 180 tablet 6  . digoxin (LANOXIN) 0.125 MG tablet Take 0.0625 mg by mouth daily.     Marland Kitchen ezetimibe (ZETIA) 10 MG tablet Take 10 mg by mouth daily.    . furosemide (LASIX) 20 MG tablet Take 20 mg by mouth.    . methocarbamol (ROBAXIN) 750 MG tablet Take 750 mg by mouth at bedtime.    . niacin 500 MG tablet Take 500 mg by mouth daily with breakfast.    . nitroGLYCERIN (NITROSTAT) 0.4 MG SL tablet Place 0.4 mg under the tongue every 5 (five) minutes as needed for chest pain.    Marland Kitchen omeprazole  (PRILOSEC) 20 MG capsule Take 20 mg by mouth 2 (two) times daily.     . ramipril (ALTACE) 10 MG capsule Take 10 mg by mouth daily.    . simvastatin (ZOCOR) 80 MG tablet Take 80 mg by mouth at bedtime.    . sucralfate (CARAFATE) 1 G tablet Take 1 g by mouth 4 (four) times daily.     No current facility-administered medications for this visit.    Allergies:   Review of patient's allergies indicates no known allergies.    Social History:  The patient  reports that he quit smoking about 19 years ago. He does not have any smokeless tobacco history on file. He reports that he does not drink alcohol or use illicit drugs.   Family History:  The patient's family history is partly unknown. There is no family history of inheritable cardiac disease   ROS:  Please see the history of present illness.    The patient specifically denies any chest pain at rest or with exertion, dyspnea at rest or with exertion, orthopnea, paroxysmal nocturnal dyspnea, syncope, palpitations, focal neurological deficits, intermittent claudication, lower extremity edema, unexplained weight gain, cough, hemoptysis or wheezing.  The patient also denies abdominal pain, nausea, vomiting, dysphagia, diarrhea, constipation, polyuria, polydipsia, dysuria, hematuria, frequency, urgency, abnormal bleeding or bruising, fever, chills, unexpected weight changes, mood swings, change in skin or hair texture, change in voice quality, auditory or visual problems, allergic reactions or rashes, new musculoskeletal complaints other than usual "aches and pains".  Otherwise, review of systems positive for none.   All other systems are reviewed and negative.    PHYSICAL EXAM: VS:  BP 106/72 mmHg  Pulse 70  Resp 16  Ht 5\' 7"  (1.702 m)  Wt 204 lb 14.4 oz (92.942 kg)  BMI 32.08 kg/m2 , BMI Body mass index is 32.08 kg/(m^2).  General: Alert, oriented x3, no distress  Head: no evidence of trauma, PERRL, EOMI, no exophtalmos or lid lag, no  myxedema, no xanthelasma; normal ears, nose and oropharynx  Neck: normal jugular venous pulsations and no hepatojugular reflux; brisk carotid pulses without delay and no carotid bruits  Chest: clear to auscultation, no signs of consolidation by percussion or palpation, normal fremitus, symmetrical and full respiratory excursions; healthy left subclavian ICD site  Cardiovascular: Slight inferolateral displacement of the apical impulse, regular rhythm, normal first and widely split second heart sounds, no murmurs, rubs or gallops  Abdomen: no tenderness or distention, no masses by palpation, no abnormal pulsatility or arterial bruits, normal bowel sounds, no hepatosplenomegaly  Extremities: no clubbing, cyanosis or edema; 2+ radial, ulnar and brachial pulses bilaterally; 2+ right femoral, posterior tibial and dorsalis pedis pulses; 2+ left femoral, posterior tibial and dorsalis pedis pulses; no subclavian or femoral  bruits  Neurological: grossly nonfocal Psych: euthymic mood, full affect   EKG:  EKG is ordered today. 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  Defibrillator at [facility] and with poor R-wave sensing. 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 I think he will benefit from implantation of an entirely new system including right atrial and right ventricular leads. Since there is a suspicion for intracranial tumor would recommend implantation of an MRI conditional device so that he can have the necessary  scans. Implantation of new leads will require overnight hospitalization for observation for lead dislodgment. Other potential complications include pneumothorax, infection, perforation and even a small risk of death. This procedure has been fully reviewed with the patient and informed consent has been obtained.  2.  Chronic systolic CHF (congestive heart failure)  From a clinical standpoint he has excellent functional status (NYHA class I) and this would be in keeping with the better EF found on echo. However, reviewing his nuclear study there is very extensive and severe perfusion deficit in the inferior, lateral and apical distributions, more in keeping with the lower ejection fraction of 26% as estimated by nuclear scintigraphy.  He is on appropriate medical therapy with a very high dose of carvedilol and maximum dose of ramipril and requires only a very small amount of loop diuretic to maintain euvolemia. I am not convinced that he needs digoxin therapy, but we'll not change therapy that has worked so well for him to this point.   3.  CAD (coronary artery disease)  No angina pectoris. He has extensive native coronary artery disease with evidence of scars in the distribution of the distal LAD, entire circumflex and entire right coronary artery distribution, but had patent grafts in the year 2000 and no reversible changes on his most recent nuclear stress test. He is free of angina. No changes in medications are necessary at this time.   4. NSVT (nonsustained ventricular tachycardia)  He has not had treatment for ventricular arrhythmia since 2011 (antitachycardia pacing). He has frequent episodes of nonsustained ventricular tachycardia but has not had an episode of sustained VT since January of 2015 when he had a 25 second episode in the monitor zone. No changes were made to his medications or defibrillator settings at this visit   5. Hyperlipidemia  All lipid parameters were within the  desirable range by a lab results from April 2015   6.  DM2 (diabetes mellitus, type 2)  Excellent control with hemoglobin A1c of 5.7% in May 2015   H7.  TN (hypertension)  Blood pressure is well controlled on high doses of ACE inhibitor and beta blocker   8.  Low TSH level  This has been consistently present on several assays over the last couple of years, free T4 levels were consistently normal. This is consistent with subclinical hyperthyroidism and should be monitored since overt thyrotoxicosis could lead to significant arrhythmic complications in the setting of severe ischemic cardiomyopathy.   Current medicines are reviewed at  length with the patient today.  The patient does not have concerns regarding medicines.  The following changes have been made:  no change  Labs/ tests ordered today include:  Orders Placed This Encounter  Procedures  . CBC  . Comprehensive metabolic panel  . APTT  . Protime-INR  . Implantable device check  . EKG 12-Lead  . IMPLANTABLE CARDIOVERTER DEFIBRILLATOR (ICD) GENERATOR CHANGE   Patient Instructions  Your physician recommends that you return for lab work in: 5-7 days prior to procedure.  Your physician has recommended that you have a defibrillator battery change out - MEDTRONIC MRI COMPATIBLE WITH NEW RV LEAD.  This is done in the hospital and usually requires an overnight stay. Please see the instruction sheet given to you today for more information.       Mikael Spray, MD  04/28/2014 6:01 PM    Sanda Klein, MD, [facility] [phone] office [phone] pager

## 2014-05-04 LAB — CBC
HCT: 43.9 % (ref 39.0–52.0)
HEMOGLOBIN: 14.5 g/dL (ref 13.0–17.0)
MCH: 30.8 pg (ref 26.0–34.0)
MCHC: 33 g/dL (ref 30.0–36.0)
MCV: 93.2 fL (ref 78.0–100.0)
MPV: 10.3 fL (ref 8.6–12.4)
Platelets: 154 10*3/uL (ref 150–400)
RBC: 4.71 MIL/uL (ref 4.22–5.81)
RDW: 14.3 % (ref 11.5–15.5)
WBC: 5 10*3/uL (ref 4.0–10.5)

## 2014-05-04 LAB — COMPREHENSIVE METABOLIC PANEL
ALBUMIN: 4.2 g/dL (ref 3.5–5.2)
ALK PHOS: 58 U/L (ref 39–117)
ALT: 18 U/L (ref 0–53)
AST: 20 U/L (ref 0–37)
BUN: 17 mg/dL (ref 6–23)
CHLORIDE: 106 meq/L (ref 96–112)
CO2: 30 meq/L (ref 19–32)
Calcium: 9.4 mg/dL (ref 8.4–10.5)
Creat: 1.2 mg/dL (ref 0.50–1.35)
Glucose, Bld: 101 mg/dL — ABNORMAL HIGH (ref 70–99)
POTASSIUM: 4.6 meq/L (ref 3.5–5.3)
Sodium: 146 mEq/L — ABNORMAL HIGH (ref 135–145)
TOTAL PROTEIN: 6.4 g/dL (ref 6.0–8.3)
Total Bilirubin: 0.8 mg/dL (ref 0.2–1.2)

## 2014-05-04 LAB — APTT: APTT: 33 s (ref 24–37)

## 2014-05-04 LAB — PROTIME-INR
INR: 1.17 (ref <1.50)
Prothrombin Time: 14.9 seconds (ref 11.6–15.2)

## 2014-05-08 DIAGNOSIS — I10 Essential (primary) hypertension: Secondary | ICD-10-CM | POA: Diagnosis not present

## 2014-05-08 DIAGNOSIS — E785 Hyperlipidemia, unspecified: Secondary | ICD-10-CM | POA: Diagnosis not present

## 2014-05-08 DIAGNOSIS — R946 Abnormal results of thyroid function studies: Secondary | ICD-10-CM | POA: Diagnosis not present

## 2014-05-08 DIAGNOSIS — Z87891 Personal history of nicotine dependence: Secondary | ICD-10-CM | POA: Diagnosis not present

## 2014-05-08 DIAGNOSIS — E119 Type 2 diabetes mellitus without complications: Secondary | ICD-10-CM | POA: Diagnosis not present

## 2014-05-08 DIAGNOSIS — I255 Ischemic cardiomyopathy: Secondary | ICD-10-CM | POA: Diagnosis not present

## 2014-05-08 DIAGNOSIS — Z4502 Encounter for adjustment and management of automatic implantable cardiac defibrillator: Secondary | ICD-10-CM | POA: Diagnosis present

## 2014-05-08 DIAGNOSIS — Z951 Presence of aortocoronary bypass graft: Secondary | ICD-10-CM | POA: Diagnosis not present

## 2014-05-08 DIAGNOSIS — Z7982 Long term (current) use of aspirin: Secondary | ICD-10-CM | POA: Diagnosis not present

## 2014-05-08 DIAGNOSIS — I5022 Chronic systolic (congestive) heart failure: Secondary | ICD-10-CM | POA: Diagnosis not present

## 2014-05-08 DIAGNOSIS — I472 Ventricular tachycardia: Secondary | ICD-10-CM | POA: Diagnosis not present

## 2014-05-08 DIAGNOSIS — I2510 Atherosclerotic heart disease of native coronary artery without angina pectoris: Secondary | ICD-10-CM | POA: Diagnosis not present

## 2014-05-08 MED ORDER — GENTAMICIN SULFATE 40 MG/ML IJ SOLN
80.0000 mg | INTRAMUSCULAR | Status: DC
  Filled 2014-05-08: qty 2

## 2014-05-08 MED ORDER — MUPIROCIN 2 % EX OINT
1.0000 "application " | TOPICAL_OINTMENT | Freq: Once | CUTANEOUS | Status: AC
  Administered 2014-05-09: 1 via TOPICAL
  Filled 2014-05-08: qty 22

## 2014-05-08 MED ORDER — SODIUM CHLORIDE 0.9 % IJ SOLN: 3.0000 mL | INTRAMUSCULAR | Status: DC | PRN

## 2014-05-08 MED ORDER — SODIUM CHLORIDE 0.9 % IV SOLN
INTRAVENOUS | Status: DC
  Administered 2014-05-09: 12:00:00 via INTRAVENOUS

## 2014-05-08 MED ORDER — CEFAZOLIN SODIUM-DEXTROSE 2-3 GM-% IV SOLR: 2.0000 g | INTRAVENOUS | Status: DC

## 2014-05-09 ENCOUNTER — Encounter (HOSPITAL_COMMUNITY): Payer: Self-pay | Admitting: *Deleted

## 2014-05-09 ENCOUNTER — Ambulatory Visit (HOSPITAL_COMMUNITY)
Admission: RE | Admit: 2014-05-09 | Discharge: 2014-05-10 | Disposition: A | Discharge status: Home / Self Care | Payer: Medicare Other | Source: Ambulatory Visit | Attending: Cardiovascular Disease | Admitting: Cardiovascular Disease

## 2014-05-09 ENCOUNTER — Encounter (HOSPITAL_COMMUNITY)
Admission: RE | Disposition: A | Discharge status: Home / Self Care | Payer: Self-pay | Source: Ambulatory Visit | Attending: Cardiovascular Disease

## 2014-05-09 DIAGNOSIS — I10 Essential (primary) hypertension: Secondary | ICD-10-CM | POA: Diagnosis present

## 2014-05-09 DIAGNOSIS — R946 Abnormal results of thyroid function studies: Secondary | ICD-10-CM | POA: Insufficient documentation

## 2014-05-09 DIAGNOSIS — Z4502 Encounter for adjustment and management of automatic implantable cardiac defibrillator: Secondary | ICD-10-CM | POA: Diagnosis not present

## 2014-05-09 DIAGNOSIS — I2510 Atherosclerotic heart disease of native coronary artery without angina pectoris: Secondary | ICD-10-CM | POA: Diagnosis present

## 2014-05-09 DIAGNOSIS — Z951 Presence of aortocoronary bypass graft: Secondary | ICD-10-CM | POA: Insufficient documentation

## 2014-05-09 DIAGNOSIS — Z87891 Personal history of nicotine dependence: Secondary | ICD-10-CM | POA: Insufficient documentation

## 2014-05-09 DIAGNOSIS — Z7982 Long term (current) use of aspirin: Secondary | ICD-10-CM | POA: Insufficient documentation

## 2014-05-09 DIAGNOSIS — I472 Ventricular tachycardia: Secondary | ICD-10-CM | POA: Insufficient documentation

## 2014-05-09 DIAGNOSIS — R7989 Other specified abnormal findings of blood chemistry: Secondary | ICD-10-CM | POA: Diagnosis present

## 2014-05-09 DIAGNOSIS — T82110A Breakdown (mechanical) of cardiac electrode, initial encounter: Secondary | ICD-10-CM | POA: Diagnosis not present

## 2014-05-09 DIAGNOSIS — E785 Hyperlipidemia, unspecified: Secondary | ICD-10-CM | POA: Diagnosis present

## 2014-05-09 DIAGNOSIS — Z95810 Presence of automatic (implantable) cardiac defibrillator: Secondary | ICD-10-CM

## 2014-05-09 DIAGNOSIS — I255 Ischemic cardiomyopathy: Secondary | ICD-10-CM | POA: Diagnosis present

## 2014-05-09 DIAGNOSIS — I5022 Chronic systolic (congestive) heart failure: Secondary | ICD-10-CM | POA: Diagnosis present

## 2014-05-09 DIAGNOSIS — E119 Type 2 diabetes mellitus without complications: Secondary | ICD-10-CM

## 2014-05-09 DIAGNOSIS — I4729 Other ventricular tachycardia: Secondary | ICD-10-CM

## 2014-05-09 HISTORY — PX: IMPLANTABLE CARDIOVERTER DEFIBRILLATOR GENERATOR CHANGE: SHX5474

## 2014-05-09 HISTORY — DX: Ischemic cardiomyopathy: I25.5

## 2014-05-09 HISTORY — DX: Presence of automatic (implantable) cardiac defibrillator: Z95.810

## 2014-05-09 HISTORY — DX: Essential (primary) hypertension: I10

## 2014-05-09 LAB — GLUCOSE, CAPILLARY: Glucose-Capillary: 162 mg/dL — ABNORMAL HIGH (ref 70–99)

## 2014-05-09 LAB — SURGICAL PCR SCREEN
MRSA, PCR: NEGATIVE
STAPHYLOCOCCUS AUREUS: NEGATIVE

## 2014-05-09 SURGERY — IMPLANTABLE CARDIOVERTER DEFIBRILLATOR GENERATOR CHANGE
Anesthesia: LOCAL

## 2014-05-09 MED ORDER — OXYCODONE-ACETAMINOPHEN 5-325 MG PO TABS
2.0000 | ORAL_TABLET | Freq: Four times a day (QID) | ORAL | Status: DC | PRN
  Administered 2014-05-09: 18:00:00 2 via ORAL
  Filled 2014-05-09: qty 2

## 2014-05-09 MED ORDER — HEPARIN (PORCINE) IN NACL 2-0.9 UNIT/ML-% IJ SOLN
INTRAMUSCULAR | Status: AC
  Filled 2014-05-09: qty 500

## 2014-05-09 MED ORDER — METHOCARBAMOL 500 MG PO TABS: 750.0000 mg | ORAL_TABLET | Freq: Three times a day (TID) | ORAL | Status: DC | PRN

## 2014-05-09 MED ORDER — NIACIN ER (ANTIHYPERLIPIDEMIC) 500 MG PO TBCR
500.0000 mg | EXTENDED_RELEASE_TABLET | Freq: Every day | ORAL | Status: DC
  Administered 2014-05-09: 21:00:00 500 mg via ORAL
  Filled 2014-05-09 (×2): qty 1

## 2014-05-09 MED ORDER — LIVING WELL WITH DIABETES BOOK
Freq: Once | Status: AC
Start: 2014-05-09 — End: 2014-05-09
  Administered 2014-05-09: 20:00:00
  Filled 2014-05-09: qty 1

## 2014-05-09 MED ORDER — ACETAMINOPHEN 325 MG PO TABS: 325.0000 mg | ORAL_TABLET | ORAL | Status: DC | PRN

## 2014-05-09 MED ORDER — PNEUMOCOCCAL VAC POLYVALENT 25 MCG/0.5ML IJ INJ
0.5000 mL | INJECTION | INTRAMUSCULAR | Status: DC
  Filled 2014-05-09: qty 0.5

## 2014-05-09 MED ORDER — ASPIRIN EC 81 MG PO TBEC
81.0000 mg | DELAYED_RELEASE_TABLET | Freq: Every day | ORAL | Status: DC
  Administered 2014-05-09 – 2014-05-10 (×2): 81 mg via ORAL
  Filled 2014-05-09 (×2): qty 1

## 2014-05-09 MED ORDER — FENTANYL CITRATE 0.05 MG/ML IJ SOLN
INTRAMUSCULAR | Status: AC
  Filled 2014-05-09: qty 2

## 2014-05-09 MED ORDER — FUROSEMIDE 20 MG PO TABS
20.0000 mg | ORAL_TABLET | Freq: Every day | ORAL | Status: DC
  Administered 2014-05-09 – 2014-05-10 (×2): 20 mg via ORAL
  Filled 2014-05-09 (×2): qty 1

## 2014-05-09 MED ORDER — NITROGLYCERIN 0.4 MG SL SUBL: 0.4000 mg | SUBLINGUAL_TABLET | SUBLINGUAL | Status: DC | PRN

## 2014-05-09 MED ORDER — MIDAZOLAM HCL 5 MG/5ML IJ SOLN
INTRAMUSCULAR | Status: AC
  Filled 2014-05-09: qty 5

## 2014-05-09 MED ORDER — YOU HAVE A PACEMAKER BOOK
Freq: Once | Status: AC
  Administered 2014-05-09: 20:00:00
  Filled 2014-05-09: qty 1

## 2014-05-09 MED ORDER — CEFAZOLIN SODIUM 1-5 GM-% IV SOLN
1.0000 g | Freq: Four times a day (QID) | INTRAVENOUS | Status: AC
  Administered 2014-05-09 – 2014-05-10 (×3): 1 g via INTRAVENOUS
  Filled 2014-05-09 (×3): qty 50

## 2014-05-09 MED ORDER — INFLUENZA VAC SPLIT QUAD 0.5 ML IM SUSY
0.5000 mL | PREFILLED_SYRINGE | INTRAMUSCULAR | Status: DC
  Filled 2014-05-09: qty 0.5

## 2014-05-09 MED ORDER — MUPIROCIN 2 % EX OINT
TOPICAL_OINTMENT | CUTANEOUS | Status: AC
Start: 2014-05-09 — End: 2014-05-09
  Filled 2014-05-09: qty 22

## 2014-05-09 MED ORDER — SUCRALFATE 1 G PO TABS
1.0000 g | ORAL_TABLET | Freq: Three times a day (TID) | ORAL | Status: DC
  Administered 2014-05-09 – 2014-05-10 (×3): 1 g via ORAL
  Filled 2014-05-09 (×7): qty 1

## 2014-05-09 MED ORDER — EZETIMIBE 10 MG PO TABS
10.0000 mg | ORAL_TABLET | Freq: Every day | ORAL | Status: DC
  Administered 2014-05-09: 21:00:00 10 mg via ORAL
  Filled 2014-05-09: qty 1

## 2014-05-09 MED ORDER — RAMIPRIL 10 MG PO CAPS
10.0000 mg | ORAL_CAPSULE | Freq: Every day | ORAL | Status: DC
  Administered 2014-05-09 – 2014-05-10 (×2): 10 mg via ORAL
  Filled 2014-05-09 (×2): qty 1

## 2014-05-09 MED ORDER — CARVEDILOL 12.5 MG PO TABS
75.0000 mg | ORAL_TABLET | Freq: Two times a day (BID) | ORAL | Status: DC
  Administered 2014-05-09 – 2014-05-10 (×2): 75 mg via ORAL
  Filled 2014-05-09 (×2): qty 6

## 2014-05-09 MED ORDER — LIDOCAINE HCL (PF) 1 % IJ SOLN
INTRAMUSCULAR | Status: AC
  Filled 2014-05-09: qty 60

## 2014-05-09 MED ORDER — DIGOXIN 0.0625 MG HALF TABLET
0.0625 mg | ORAL_TABLET | Freq: Every day | ORAL | Status: DC
  Administered 2014-05-09 – 2014-05-10 (×2): 0.0625 mg via ORAL
  Filled 2014-05-09 (×2): qty 1

## 2014-05-09 MED ORDER — ONDANSETRON HCL 4 MG/2ML IJ SOLN
4.0000 mg | Freq: Four times a day (QID) | INTRAMUSCULAR | Status: DC | PRN
Start: 2014-05-09 — End: 2014-05-10

## 2014-05-09 MED ORDER — SODIUM CHLORIDE 0.9 % IV SOLN: INTRAVENOUS | Status: AC

## 2014-05-09 MED ORDER — PANTOPRAZOLE SODIUM 40 MG PO TBEC
40.0000 mg | DELAYED_RELEASE_TABLET | Freq: Every day | ORAL | Status: DC
  Administered 2014-05-09 – 2014-05-10 (×2): 40 mg via ORAL
  Filled 2014-05-09 (×2): qty 1

## 2014-05-09 NOTE — Op Note (Signed)
Procedure report  Procedure performed:  1. Implantation of new single chamber cardioverter defibrillator (preexisting lead capped) 2. Fluoroscopy, left upper extremity venography 3. Moderate sedation 4. Initial lead and generator testing    Reason for procedure: Primary prevention of sudden cardiac death Ischemic cardiomyopathy, left ventricular ejection fraction less than 30%, Heart failure NYHA class 2, on comprehensive medical therapy for over 90 days (MADIT-II) Old ICD generator battery depletion Chronic ventricular lead undersensing  Procedure performed by: Sanda Klein, MD  Complications: None  Estimated blood loss: <10 mL  Medications administered during procedure: Ancef 2 g intravenously Lidocaine 1% 30 mL locally,  Fentanyl 100 mcg intravenously Versed 6 mg intravenously  Device details:  Generator Medtronic Evera MRI XT VR model [id number] serial number [id number] Right ventricular lead Medtronic [id number] serial number [id number]  Explanted generator Medtronic Maximo II VR, model D284VRC, serial number [id number] (implanted 06/08/2007) Capped RV lead Medtronic [id number], serial number [id number]  Procedure details:  Mr. Seifer received an ICD as primary prevention in 2009, for severe ischemic cardiomyopathy. He received appropriate therapy (ATP) in 2011 for sustained monomorphic VT. His device has reached ERI. RV sensing via the chronic RV lead is poor with R waves under 2 mV.  After the risks and benefits of the procedure were discussed the patient provided informed consent and was brought to the cardiac cath lab in the fasting state. The patient was prepped and draped in usual sterile fashion. A venogam demonstrated a patent left subclavian vein. Local anesthesia with 1% lidocaine was administered to to the left infraclavicular area. A 5-6 cm horizontal incision was made parallel with and 2-3 cm caudal to the left clavicle, just caudal to the older scar. Using limited  electrocautery and blunt dissection the prepectoral pocket was opened and the old device explanted, without much difficulty. The pocket was carefully inspected for hemostasis. An antibiotic-soaked sponge was placed in the pocket. The old ICD was disconnected and the pins of the chronic lead were capped.  Under fluoroscopic guidance and using the modified Seldinger technique a single venipuncture was performed to access the left subclavian vein. No difficulty was encountered accessing the vein.  A J-tip guidewire was subsequently exchanged for a 9.5 [place] safe sheath.  Under fluoroscopic guidance the ventricular lead was advanced to level of the mid to apical right ventricular septum. Multiple locations were mapped and R waves were mediocre (2-4 mV). Finally a location higher on the septum showed mapped R waves of 8-9 mV and the active-fixation helix was deployed. Prominent current of injury was seen. Satisfactory pacing and sensing parameters were recorded, with R waves of 7 mV. There was no evidence of diaphragmatic stimulation at maximum device output. The safe sheath was peeled away and the lead was secured in place with 2-0 silk.  The antibiotic-soaked sponge was removed from the pocket. The pocket was flushed with copious amounts of antibiotic solution. Reinspection showed excellent hemostasis.  The ventricular lead was connected to the generator and appropriate ventricular pacing was seen. Repeat testing of the lead parameters later showed excellent values of the impedance and pacing threshold, but sensing decreased to 5 mV. The sensing appeared stable.  The entire system was then carefully inserted in the pocket with care been taking that the leads and device assumed a comfortable position without pressure on the incision. Great care was taken that the leads be located deep to the generator. The pocket was then closed in layers using 2 layers of 2-0 Vicryl and cutaneous staples, after  which a  sterile dressing was applied.  Defibrillation threshold testing was then performed. After adequate sedation was achieved, ventricular fibrillation was induced with a 1J shock on T method. There was appropriate sensing by the device. There were 2 or 3 dropouts during "least sensitive" settings. The arrhythmia was terminated by a single 15 J shock. Retesting of the lead confirmed normal function with unchanged sensing.  At the end of the procedure the following lead parameters were encountered: sensed R waves 5.4 mV, impedance 513 ohms, threshold 0.5 V at 0.4 ms pulse width.  High voltage impedance 66, charge time 8 s.  Sanda Klein, MD, [facility] [phone] office [phone] pager

## 2014-05-09 NOTE — H&P (View-Only) (Signed)
Patient ID: Ronnie Nelson, male   DOB: [birth year], 69 y.o.   MRN: [id number]     Cardiology Office Note   Date:  04/28/2014   ID:  Ronnie Nelson, DOB Nov 16, 1945, MRN [id number]  PCP:  Coy Saunas, MD  Cardiologist:   Sanda Klein, MD   Chief Complaint  Patient presents with  . Follow-up    pt denied chest pain and SOB, pt was having an MRI last tuesday for his right ear which was cancel because od Pacer      History of Present Illness: Ronnie Nelson is a 69 y.o. male who presents for ICD follow-up. Surprisingly his single-chamber defibrillator has reached elective replacement indicator at less than 5 years since implantation. There was an unexpected marked drop in battery voltage (2.61 V, ERI 2.63 V). In addition, he has had chronically poor sensing of R waves (measured around 2 mV).  There was a plan for him to have a head MRI at [facility] to evaluate his hearing problems, I think due to suspicion of a possible acoustic neuroma. This was abandoned because of his device.  From a symptom standpoint he feels great. He denies shortness of breath or chest pain. He is active 4/5 hours per day.  He has not had any episodes of detected ventricular tachycardia or any ventricular pacing.   He has a long-standing history of coronary artery disease and had bypass surgery in 1999. Has not required coronary angiography since the year 2000 when all his grafts were found to be patent. He does not have angina pectoris and a nuclear stress test performed roughly one year ago did not show reversible ischemia, but did show very extensive scar in the distribution of all 3 major coronary arteries.  Left ventricular systolic function is at least mildly depressed. EF was estimated to be 40-45% on an echo performed last December, but a nuclear stress test performed in October of last year showed an ejection fraction of 26%. He does not have overt symptoms of congestive heart failure at this time  and is remarkably active. He requires a very low dose of loop diuretic.  He has a defibrillator that was implanted in 2009. It is a single chamber Medtronic device. Therapy has not been delivered since 2011 when he received antitachycardia pacing. I don't think he has ever had a shock. The device does report frequent episodes of nonsustained ventricular tachycardia and occasional episodes of brief sustained VT of up to 25 seconds in duration, usually around 150 beats per minute. These have not been symptomatic.  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   Past Medical History  Diagnosis Date  . DM (diabetes mellitus)   . Hyperlipidemia   . CAD (coronary artery disease)     CABG 1996: SVG to LAD,LIMA to CX,SVG to RCA    Past Surgical History  Procedure Laterality Date  . Coronary artery bypass graft  [place]  . Cardiac catheterization  11/14/1998    severe depressed LV systolic function EF 28-76%  . Cardiac defibrillator placement  06/08/07    Medtronic  . Nm myocar perf wall motion  11/21/2011    no significant ischemia     Current Outpatient Prescriptions  Medication Sig Dispense Refill  . aspirin 81 MG tablet Take 81 mg by mouth daily.    . carvedilol (  COREG) 25 MG tablet TAKE 3 TABLETS (75 MG TOTAL) BY MOUTH 2 (TWO) TIMES DAILY WITH A MEAL. 180 tablet 6  . digoxin (LANOXIN) 0.125 MG tablet Take 0.0625 mg by mouth daily.     Marland Kitchen ezetimibe (ZETIA) 10 MG tablet Take 10 mg by mouth daily.    . furosemide (LASIX) 20 MG tablet Take 20 mg by mouth.    . methocarbamol (ROBAXIN) 750 MG tablet Take 750 mg by mouth at bedtime.    . niacin 500 MG tablet Take 500 mg by mouth daily with breakfast.    . nitroGLYCERIN (NITROSTAT) 0.4 MG SL tablet Place 0.4 mg under the tongue every 5 (five) minutes as needed for chest pain.    Marland Kitchen omeprazole  (PRILOSEC) 20 MG capsule Take 20 mg by mouth 2 (two) times daily.     . ramipril (ALTACE) 10 MG capsule Take 10 mg by mouth daily.    . simvastatin (ZOCOR) 80 MG tablet Take 80 mg by mouth at bedtime.    . sucralfate (CARAFATE) 1 G tablet Take 1 g by mouth 4 (four) times daily.     No current facility-administered medications for this visit.    Allergies:   Review of patient's allergies indicates no known allergies.    Social History:  The patient  reports that he quit smoking about 19 years ago. He does not have any smokeless tobacco history on file. He reports that he does not drink alcohol or use illicit drugs.   Family History:  The patient's family history is partly unknown. There is no family history of inheritable cardiac disease   ROS:  Please see the history of present illness.    The patient specifically denies any chest pain at rest or with exertion, dyspnea at rest or with exertion, orthopnea, paroxysmal nocturnal dyspnea, syncope, palpitations, focal neurological deficits, intermittent claudication, lower extremity edema, unexplained weight gain, cough, hemoptysis or wheezing.  The patient also denies abdominal pain, nausea, vomiting, dysphagia, diarrhea, constipation, polyuria, polydipsia, dysuria, hematuria, frequency, urgency, abnormal bleeding or bruising, fever, chills, unexpected weight changes, mood swings, change in skin or hair texture, change in voice quality, auditory or visual problems, allergic reactions or rashes, new musculoskeletal complaints other than usual "aches and pains".  Otherwise, review of systems positive for none.   All other systems are reviewed and negative.    PHYSICAL EXAM: VS:  BP 106/72 mmHg  Pulse 70  Resp 16  Ht 5\' 7"  (1.702 m)  Wt 204 lb 14.4 oz (92.942 kg)  BMI 32.08 kg/m2 , BMI Body mass index is 32.08 kg/(m^2).  General: Alert, oriented x3, no distress  Head: no evidence of trauma, PERRL, EOMI, no exophtalmos or lid lag, no  myxedema, no xanthelasma; normal ears, nose and oropharynx  Neck: normal jugular venous pulsations and no hepatojugular reflux; brisk carotid pulses without delay and no carotid bruits  Chest: clear to auscultation, no signs of consolidation by percussion or palpation, normal fremitus, symmetrical and full respiratory excursions; healthy left subclavian ICD site  Cardiovascular: Slight inferolateral displacement of the apical impulse, regular rhythm, normal first and widely split second heart sounds, no murmurs, rubs or gallops  Abdomen: no tenderness or distention, no masses by palpation, no abnormal pulsatility or arterial bruits, normal bowel sounds, no hepatosplenomegaly  Extremities: no clubbing, cyanosis or edema; 2+ radial, ulnar and brachial pulses bilaterally; 2+ right femoral, posterior tibial and dorsalis pedis pulses; 2+ left femoral, posterior tibial and dorsalis pedis pulses; no subclavian or femoral  bruits  Neurological: grossly nonfocal Psych: euthymic mood, full affect   EKG:  EKG is ordered today. 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  Defibrillator at [facility] and with poor R-wave sensing. 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 I think he will benefit from implantation of an entirely new system including right atrial and right ventricular leads. Since there is a suspicion for intracranial tumor would recommend implantation of an MRI conditional device so that he can have the necessary  scans. Implantation of new leads will require overnight hospitalization for observation for lead dislodgment. Other potential complications include pneumothorax, infection, perforation and even a small risk of death. This procedure has been fully reviewed with the patient and informed consent has been obtained.  2.  Chronic systolic CHF (congestive heart failure)  From a clinical standpoint he has excellent functional status (NYHA class I) and this would be in keeping with the better EF found on echo. However, reviewing his nuclear study there is very extensive and severe perfusion deficit in the inferior, lateral and apical distributions, more in keeping with the lower ejection fraction of 26% as estimated by nuclear scintigraphy.  He is on appropriate medical therapy with a very high dose of carvedilol and maximum dose of ramipril and requires only a very small amount of loop diuretic to maintain euvolemia. I am not convinced that he needs digoxin therapy, but we'll not change therapy that has worked so well for him to this point.   3.  CAD (coronary artery disease)  No angina pectoris. He has extensive native coronary artery disease with evidence of scars in the distribution of the distal LAD, entire circumflex and entire right coronary artery distribution, but had patent grafts in the year 2000 and no reversible changes on his most recent nuclear stress test. He is free of angina. No changes in medications are necessary at this time.   4. NSVT (nonsustained ventricular tachycardia)  He has not had treatment for ventricular arrhythmia since 2011 (antitachycardia pacing). He has frequent episodes of nonsustained ventricular tachycardia but has not had an episode of sustained VT since January of 2015 when he had a 25 second episode in the monitor zone. No changes were made to his medications or defibrillator settings at this visit   5. Hyperlipidemia  All lipid parameters were within the  desirable range by a lab results from April 2015   6.  DM2 (diabetes mellitus, type 2)  Excellent control with hemoglobin A1c of 5.7% in May 2015   H7.  TN (hypertension)  Blood pressure is well controlled on high doses of ACE inhibitor and beta blocker   8.  Low TSH level  This has been consistently present on several assays over the last couple of years, free T4 levels were consistently normal. This is consistent with subclinical hyperthyroidism and should be monitored since overt thyrotoxicosis could lead to significant arrhythmic complications in the setting of severe ischemic cardiomyopathy.   Current medicines are reviewed at  length with the patient today.  The patient does not have concerns regarding medicines.  The following changes have been made:  no change  Labs/ tests ordered today include:  Orders Placed This Encounter  Procedures  . CBC  . Comprehensive metabolic panel  . APTT  . Protime-INR  . Implantable device check  . EKG 12-Lead  . IMPLANTABLE CARDIOVERTER DEFIBRILLATOR (ICD) GENERATOR CHANGE   Patient Instructions  Your physician recommends that you return for lab work in: 5-7 days prior to procedure.  Your physician has recommended that you have a defibrillator battery change out - MEDTRONIC MRI COMPATIBLE WITH NEW RV LEAD.  This is done in the hospital and usually requires an overnight stay. Please see the instruction sheet given to you today for more information.       Mikael Spray, MD  04/28/2014 6:01 PM    Sanda Klein, MD, [facility] [phone] office [phone] pager

## 2014-05-09 NOTE — Interval H&P Note (Signed)
History and Physical Interval Note:  05/09/2014 2:02 PM  Ronnie Nelson  has presented today for surgery, with the diagnosis of eri  The various methods of treatment have been discussed with the patient and family. After consideration of risks, benefits and other options for treatment, the patient has consented to  Procedure(s): IMPLANTABLE CARDIOVERTER DEFIBRILLATOR GENERATOR CHANGE (N/A) as a surgical intervention .  The patient's history has been reviewed, patient examined, no change in status, stable for surgery.  I have reviewed the patient's chart and labs.  Questions were answered to the patient's satisfaction.     Phillippa Straub

## 2014-05-09 NOTE — Progress Notes (Signed)
ICD Criteria  Current LVEF:40% by echo; 26% by scintigraphy ;Obtained > 6 months ago.   NYHA Functional Classification: Class II  Heart Failure History:  Yes, Duration of heart failure since onset is > 9 months  Non-Ischemic Dilated Cardiomyopathy History:  No.  Atrial Fibrillation/Atrial Flutter:  No.  Ventricular Tachycardia History:  No.  Cardiac Arrest History:  No  History of Syndromes with Risk of Sudden Death:  No.  Previous ICD:  Yes, ICD Type:  Single, Reason for ICD:  Primary prevention.  25%  Electrophysiology Study: No.  Prior MI: Yes, Most recent MI timeframe is > 40 days.  PPM: No.  OSA:  No  Patient Life Expectancy of >=1 year: Yes.  Anticoagulation Therapy:  Patient is NOT on anticoagulation therapy.   Beta Blocker Therapy:  Yes.   Ace Inhibitor/ARB Therapy:  Yes.

## 2014-05-10 ENCOUNTER — Ambulatory Visit (HOSPITAL_COMMUNITY): Payer: Medicare Other

## 2014-05-10 ENCOUNTER — Encounter (HOSPITAL_COMMUNITY): Payer: Self-pay | Admitting: Physician Assistant

## 2014-05-10 DIAGNOSIS — I10 Essential (primary) hypertension: Secondary | ICD-10-CM | POA: Diagnosis not present

## 2014-05-10 DIAGNOSIS — I2510 Atherosclerotic heart disease of native coronary artery without angina pectoris: Secondary | ICD-10-CM | POA: Diagnosis not present

## 2014-05-10 DIAGNOSIS — Z4502 Encounter for adjustment and management of automatic implantable cardiac defibrillator: Secondary | ICD-10-CM | POA: Diagnosis not present

## 2014-05-10 DIAGNOSIS — I5022 Chronic systolic (congestive) heart failure: Secondary | ICD-10-CM | POA: Diagnosis not present

## 2014-05-10 LAB — GLUCOSE, CAPILLARY: GLUCOSE-CAPILLARY: 102 mg/dL — AB (ref 70–99)

## 2014-05-10 NOTE — Progress Notes (Signed)
Patient Name: Ronnie Nelson Date of Encounter: 05/10/2014     Active Problems:   DM2 (diabetes mellitus, type 2)   HTN (hypertension)   CAD (coronary artery disease)   Cardiomyopathy, ischemic   Chronic systolic CHF (congestive heart failure)   NSVT (nonsustained ventricular tachycardia)   ICD (implantable cardioverter-defibrillator) in place   Hyperlipidemia   Low TSH level   ICD (implantable cardioverter-defibrillator) battery depletion   ICD (implantable cardioverter-defibrillator) lead failure    SUBJECTIVE  Feeling well and ready to go home.    CURRENT MEDS . aspirin EC  81 mg Oral Daily  . carvedilol  75 mg Oral BID WC  .  ceFAZolin (ANCEF) IV  1 g Intravenous Q6H  . digoxin  0.0625 mg Oral Daily  . ezetimibe  10 mg Oral QHS  . furosemide  20 mg Oral Daily  . Influenza vac split quadrivalent PF  0.5 mL Intramuscular Tomorrow-1000  . niacin  500 mg Oral QHS  . pantoprazole  40 mg Oral Daily  . pneumococcal 23 valent vaccine  0.5 mL Intramuscular Tomorrow-1000  . ramipril  10 mg Oral Daily  . sucralfate  1 g Oral TID WC & HS    OBJECTIVE  Filed Vitals:   05/09/14 2000 05/09/14 2353 05/10/14 0001 05/10/14 0603  BP: 97/66 116/70  124/84  Pulse: 61 66  62  Temp: 97.5 F (36.4 C) 98.3 F (36.8 C)  98.1 F (36.7 C)  TempSrc: Oral Oral  Oral  Resp: 19 16  20   Height:      Weight:   202 lb 13.2 oz (92 kg)   SpO2: 96% 65%  96%    Intake/Output Summary (Last 24 hours) at 05/10/14 0716 Last data filed at 05/10/14 0602  Gross per 24 hour  Intake    940 ml  Output    800 ml  Net    140 ml   Filed Weights   05/09/14 1125 05/10/14 0001  Weight: 204 lb (92.534 kg) 202 lb 13.2 oz (92 kg)    PHYSICAL EXAM  General: Pleasant, NAD. Neuro: Alert and oriented X 3. Moves all extremities spontaneously. Psych: Normal affect. HEENT:  Normal  Neck: Supple without bruits or JVD. Lungs:  Resp regular and unlabored, CTA. Heart: RRR no s3, s4, or murmurs. Abdomen:  Soft, non-tender, non-distended, BS + x 4.  Extremities: No clubbing, cyanosis or edema. DP/PT/Radials 2+ and equal bilaterally. Arm in sling .  Accessory Clinical Findings  TELE  NSR with freq PVCs and 4 beats of NSVT   Radiology/Studies  No results found.  ASSESSMENT AND PLAN  Ronnie Nelson is a 69 y.o. male with a history of CAD s/p CABG (1999), ischemic CM (EF 26% by myoview) s/p ICD, DM, HLD, HTN and subclinical hyperthyroidism who presented to [facility] on 05/09/14 for elective replacement of his Medtronic ICD due to ERI and poor R-wave sensing.   Defibrillator at [facility] and with poor R-wave sensing. 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 It was felt that he would benefit from implantation of an entirely new system including right atrial and right ventricular leads. Since there is a suspicion for intracranial tumor would recommend implantation of an MRI conditional device so that he can have the necessary scans. -- He is now s/p successful implantation of new single chamber cardioverter defibrillator (preexisting lead capped):  Device info: Generator Medtronic Evera MRI XT VR model [id number] serial  number [id number] H Right ventricular lead Medtronic [id number] serial number [id number] -- Device will be interogated this morning and CXR will be performed to evaluation for PTX  Chronic systolic CHF (congestive heart failure)  From a clinical standpoint he has excellent functional status (NYHA class I) and this would be in keeping with the better EF found on echo. However, reviewing his nuclear study there is very extensive and severe perfusion deficit in the inferior, lateral and apical distributions, more in keeping with the lower ejection fraction of 26% as estimated by nuclear scintigraphy.  -- Continue carvedilol and and ramipril  CAD (coronary artery disease)  -- Stable. No angina pectoris. -- He has extensive native coronary  artery disease with evidence of scars in the distribution of the distal LAD, entire circumflex and entire right coronary artery distribution, but had patent grafts in the year 2000 and no reversible changes on his most recent nuclear stress test. He is free of angina. -- No changes in medications are necessary at this time.   NSVT (nonsustained ventricular tachycardia)  -- He has not had treatment for ventricular arrhythmia since 2011 (antitachycardia pacing). He has frequent episodes of nonsustained ventricular tachycardia but has not had an episode of sustained VT since January of 2015 when he had a 25 second episode in the monitor zone. No changes were made to his medications or defibrillator settings at this visit   Hyperlipidemia  -- All lipid parameters were within the desirable range by a lab results from April 2015   DM2 (diabetes mellitus, type 2)  --Excellent control with hemoglobin A1c of 5.7% in May 2015   HTN (hypertension)  -- Blood pressure is well controlled on high doses of ACE inhibitor and beta blocker   Low TSH level  -- This has been consistently present on several assays over the last couple of years, free T4 levels were consistently normal. This is consistent with subclinical hyperthyroidism and should be monitored since overt thyrotoxicosis could lead to significant arrhythmic complications in the setting of severe ischemic cardiomyopathy.   Mable Fill R PA-C  Pager [phone]  I have seen and examined the patient along with Angelena Form R PA-C.  I have reviewed the chart, notes and new data.  I agree with PA's note.  CXR looks good, wound healthy, R waves remain mediocre but acceptable at 5 mV (with good impedance and pacing threshold)  PLAN: DC home. Wound check 7-10 days. Office check 4-6 weeks. Wound care and activity restrictions reviewed.  Sanda Klein, MD, [place] [phone] 05/10/2014, 7:53  AM

## 2014-05-10 NOTE — Discharge Summary (Signed)
Discharge Summary   Patient ID: Ronnie Nelson MRN: [id number], DOB/AGE: [birth year] 69 y.o. Admit date: 05/09/2014 D/C date:     05/10/2014  Primary Cardiologist: Dr. Sallyanne Kuster  Principal Problem:   ICD (implantable cardioverter-defibrillator) in place Active Problems:   ICD (implantable cardioverter-defibrillator) battery depletion   ICD (implantable cardioverter-defibrillator) lead failure   DM2 (diabetes mellitus, type 2)   HTN (hypertension)   CAD (coronary artery disease)   Cardiomyopathy, ischemic   Chronic systolic CHF (congestive heart failure)   NSVT (nonsustained ventricular tachycardia)   Hyperlipidemia   Low TSH level    Admission Dates: 05/09/14-05/10/14 Discharge Diagnosis: ICD at [facility] and with poor R Wave sensing- s/p successful implantation of new single chamber cardioverter defibrillator (preexisting lead capped)   Device info: Generator Medtronic Evera MRI [place] VR model [id number] serial number [id number] Right ventricular lead Medtronic [id number] serial number [id number]   HPI: Ronnie Nelson is a 69 y.o. male with a history of CAD s/p CABG (1999), ischemic CM (EF 26% by myoview) s/p ICD, DM, HLD, HTN and subclinical hyperthyroidism who presented to [facility] on 05/09/14 for elective replacement of his Medtronic ICD due to ERI and poor R-wave sensing.    Hospital Course  Defibrillator at [facility] and with poor R-wave sensing. 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 It was felt that he would benefit from implantation of an entirely new system including right atrial and right ventricular leads. Since there is a suspicion for intracranial tumor would recommend implantation of an MRI conditional device so that he can have the necessary scans. -- He is now s/p successful implantation of new single chamber cardioverter defibrillator (preexisting lead capped): Device info: Generator Medtronic Evera MRI XT VR model [id number]  serial number [id number] Right ventricular lead Medtronic [id number] serial number [id number] -- Device was interrogated this morning and reveals normal device functioning and CXR with no evidence of PTX. Wound healthy, R waves remain mediocre but acceptable at 5 mV (with good impedance and pacing threshold)  Chronic systolic CHF (congestive heart failure)  From a clinical standpoint he has excellent functional status (NYHA class I) and this would be in keeping with the better EF found on echo. However, reviewing his nuclear study there is very extensive and severe perfusion deficit in the inferior, lateral and apical distributions, more in keeping with the lower ejection fraction of 26% as estimated by nuclear scintigraphy.  -- Continue carvedilol, ramipril and digoxin  CAD (coronary artery disease)  -- Stable. No angina pectoris. -- He has extensive native coronary artery disease with evidence of scars in the distribution of the distal LAD, entire circumflex and entire right coronary artery distribution, but had patent grafts in the year 2000 and no reversible changes on his most recent nuclear stress test. He is free of angina. -- No changes in medications are necessary at this time.   NSVT (nonsustained ventricular tachycardia)  -- He has not had treatment for ventricular arrhythmia since 2011 (antitachycardia pacing). He has frequent episodes of nonsustained ventricular tachycardia but has not had an episode of sustained VT since January of 2015 when he had a 25 second episode in the monitor zone. No changes were made to his medications or defibrillator settings at this visit   Hyperlipidemia  -- All lipid parameters were within the desirable range by a lab results from April 2015   DM2 (diabetes mellitus, type 2)  --Excellent control with hemoglobin A1c  of 5.7% in May 2015   HTN (hypertension)  -- Blood pressure is well controlled on high doses of ACE inhibitor and beta blocker    Low TSH level  -- This has been consistently present on several assays over the last couple of years, free T4 levels were consistently normal. This is consistent with subclinical hyperthyroidism and should be monitored since overt thyrotoxicosis could lead to significant arrhythmic complications in the setting of severe ischemic cardiomyopathy.   The patient has had an uncomplicated hospital course and is recovering well. He has been seen by Dr. Sallyanne Kuster today and deemed ready for discharge home. All follow-up appointments have been scheduled. Discharge medications are listed below.   Discharge Vitals: Blood pressure 124/84, pulse 62, temperature 98.1 F (36.7 C), temperature source Oral, resp. rate 20, height 5\' 7"  (1.702 m), weight 202 lb 13.2 oz (92 kg), SpO2 96 %.  Labs: Lab Results  Component Value Date   WBC 5.0 05/03/2014   HGB 14.5 05/03/2014   HCT 43.9 05/03/2014   MCV 93.2 05/03/2014   PLT 154 05/03/2014     Recent Labs Lab 05/03/14 1125  NA 146*  K 4.6  CL 106  CO2 30  BUN 17  CREATININE 1.20  CALCIUM 9.4  PROT 6.4  BILITOT 0.8  ALKPHOS 58  ALT 18  AST 20  GLUCOSE 101*     Diagnostic Studies/Procedures   05/09/14 Procedure report  Procedure performed:  1. Implantation of new single chamber cardioverter defibrillator (preexisting lead capped) 2. Fluoroscopy, left upper extremity venography 3. Moderate sedation 4. Initial lead and generator testing Reason for procedure: Primary prevention of sudden cardiac death Ischemic cardiomyopathy, left ventricular ejection fraction less than 30%, Heart failure NYHA class 2, on comprehensive medical therapy for over 90 days (MADIT-II) Old ICD generator battery depletion Chronic ventricular lead undersensing Device details:  Generator Medtronic Evera MRI XT VR model [id number] serial number [id number] Right ventricular lead Medtronic [id number] serial number [id number]  Explanted generator Medtronic Maximo II VR,  model [id number], serial number [id number] (implanted 06/08/2007) Capped RV lead Medtronic [id number], serial number [id number]  Procedure details:  Mr. Deloatch received an ICD as primary prevention in 2009, for severe ischemic cardiomyopathy. He received appropriate therapy (ATP) in 2011 for sustained monomorphic VT. His device has reached ERI. RV sensing via the chronic RV lead is poor with R waves under 2 mV.  Defibrillation threshold testing was then performed. After adequate sedation was achieved, ventricular fibrillation was induced with a 1J shock on T method. There was appropriate sensing by the device. There were 2 or 3 dropouts during "least sensitive" settings. The arrhythmia was terminated by a single 15 J shock. Retesting of the lead confirmed normal function with unchanged sensing.  At the end of the procedure the following lead parameters were encountered: sensed R waves 5.4 mV, impedance 513 ohms, threshold 0.5 V at 0.4 ms pulse width.  High voltage impedance 66, charge time 8 s.  Discharge Medications     Medication List    TAKE these medications        aspirin EC 81 MG tablet  Take 81 mg by mouth daily.     carvedilol 25 MG tablet  Commonly known as:  COREG  TAKE 3 TABLETS (75 MG TOTAL) BY MOUTH 2 (TWO) TIMES DAILY WITH A MEAL.     digoxin 0.125 MG tablet  Commonly known as:  LANOXIN  Take 0.0625 mg by mouth daily.     ezetimibe 10 MG  tablet  Commonly known as:  ZETIA  Take 10 mg by mouth at bedtime.     furosemide 20 MG tablet  Commonly known as:  LASIX  Take 20 mg by mouth daily.     methocarbamol 750 MG tablet  Commonly known as:  ROBAXIN  Take 750 mg by mouth every 8 (eight) hours as needed for muscle spasms (shoulder pain).     niacin 500 MG CR tablet  Commonly known as:  NIASPAN  Take 500 mg by mouth at bedtime.     nitroGLYCERIN 0.4 MG SL tablet  Commonly known as:  NITROSTAT  Place 0.4 mg under the tongue every 5 (five) minutes as needed for chest pain.       omeprazole 20 MG capsule  Commonly known as:  PRILOSEC  Take 20 mg by mouth 2 (two) times daily.     ramipril 10 MG capsule  Commonly known as:  ALTACE  Take 10 mg by mouth daily.     simvastatin 80 MG tablet  Commonly known as:  ZOCOR  Take 80 mg by mouth at bedtime.     sucralfate 1 G tablet  Commonly known as:  CARAFATE  Take 1 g by mouth 4 (four) times daily -  with meals and at bedtime.        Disposition   The patient will be discharged in stable condition to home.  Follow-up Information    Follow up with Sanda Klein, MD On 06/22/2014.   Specialty:  Cardiology   Why:  @ 2:45pm   Contact information:   [address] [place] [address] [phone]       Follow up with [facility] On 05/18/2014.   Why:  [address] for a wound check at 2:30pm   Contact information:   [place] [phone]        Duration of Discharge Encounter: Greater than 30 minutes including physician and PA time.  Signed, [place] Silos, KATHRYN R PA-C 05/10/2014, 8:10 AM

## 2014-05-10 NOTE — Discharge Instructions (Signed)
Supplemental Discharge Instructions for  Pacemaker/Defibrillator Patients  ACTIVITY  Do not raise your left/right arm above shoulder level or extend it backward beyond shoulder level for 2 weeks. Wear the arm sling as a reminder or as needed for comfort for 2 weeks. No heavy lifting or vigorous activity with your left/right arm for 6-8 weeks.  NO DRIVING is preferable for 2 weeks; If absolutely necessary, drive only short, familiar routes. DO wear your seatbelt, even if it crosses over the pacemaker site.  WOUND CARE  Keep the wound area clean and dry. Remove the dressing the day after you return home (usually 48 hours after the procedure).  DO NOT SUBMERGE UNDER WATER UNTIL FULLY HEALED (no tub baths, hot tubs, swimming pools, etc.).  You may shower or take a sponge bath after the dressing is removed. DO NOT SOAK the area and do not allow the shower to directly spray on the site.  If you have staples, these will be removed in the office in 7-14 days.  If you have tape/steri-strips on your wound, these will fall off; do not pull them off prematurely.  No bandage is needed on the site. DO NOT apply any creams, oils, or ointments to the wound area.  If you notice any drainage or discharge from the wound, any swelling, excessive redness or bruising at the site, or if you develop a fever > 101? F after you are discharged home, call the office at once. SPECIAL INSTRUCTIONS  You are still able to use cellular telephones. Avoid carrying your cellular phone near your device.  When traveling through airports, show security personnel your identification card to avoid being screened in the metal detectors.  Avoid arc welding equipment, MRI testing (magnetic resonance imaging), TENS units (transcutaneous nerve stimulators). Call the office for questions about other devices.  Avoid electrical appliances that are in poor condition or are not properly grounded.  Microwave ovens are safe to be near or to  operate.

## 2014-05-11 ENCOUNTER — Encounter: Payer: Self-pay | Admitting: Cardiovascular Disease

## 2014-05-11 ENCOUNTER — Telehealth: Payer: Self-pay | Admitting: Cardiovascular Disease

## 2014-05-11 ENCOUNTER — Other Ambulatory Visit: Payer: Self-pay | Admitting: *Deleted

## 2014-05-11 MED ORDER — HYDROCODONE-ACETAMINOPHEN 5-325 MG PO TABS: 1.0000 | ORAL_TABLET | Freq: Four times a day (QID) | ORAL | Status: DC | PRN

## 2014-05-11 NOTE — Telephone Encounter (Signed)
Spoke to [address] office prescription awaiting  family

## 2014-05-11 NOTE — Telephone Encounter (Signed)
Pt was discharged from the hospital yesterday. He was prescribed new medicine,but his new medicine was not called in.Please find ou the new medicine and please call to [phone].

## 2014-05-11 NOTE — Telephone Encounter (Signed)
-----   Message from Eileen Stanford, PA-C sent at 05/11/2014 10:55 AM EDT ----- I called in SL NTG, but cannot call in pain meds over the phone. IF he wants some Dr.C  Michela Pitcher he could pick up a RX for 10 vicodin in the office. Luisa Dago PA-C is at the [address] office and is willing to write it if he wants to pick it up there. There are no PAs to help me at [facility].  Please let the patient know this. THank you  Katie

## 2014-05-11 NOTE — Telephone Encounter (Signed)
Informed patient's family member to pick prescription at [facility] [address]

## 2014-05-11 NOTE — Telephone Encounter (Signed)
Spoke  With family member. V.she states patient was suppose to received a prescription for pain sent to pharmacy- themedication is not there.  she states also NTG SL needs to be ordered.  RN will verify and contact family member when medication is ready to be picked up

## 2014-05-12 ENCOUNTER — Encounter: Payer: Self-pay | Admitting: Cardiovascular Disease

## 2014-05-18 ENCOUNTER — Ambulatory Visit (INDEPENDENT_AMBULATORY_CARE_PROVIDER_SITE_OTHER): Payer: Medicare Other | Admitting: *Deleted

## 2014-05-18 DIAGNOSIS — I255 Ischemic cardiomyopathy: Secondary | ICD-10-CM | POA: Diagnosis not present

## 2014-05-18 LAB — MDC_IDC_ENUM_SESS_TYPE_INCLINIC
HighPow Impedance: 190 Ohm
HighPow Impedance: 61 Ohm
Lead Channel Pacing Threshold Amplitude: 0.625 V
Lead Channel Pacing Threshold Pulse Width: 0.4 ms
Lead Channel Setting Pacing Pulse Width: 0.4 ms
MDC IDC MSMT BATTERY REMAINING LONGEVITY: 137 mo
MDC IDC MSMT BATTERY VOLTAGE: 3.11 V
MDC IDC MSMT LEADCHNL RV IMPEDANCE VALUE: 513 Ohm
MDC IDC MSMT LEADCHNL RV SENSING INTR AMPL: 5.625 mV
MDC IDC SESS DTM: 20160407151209
MDC IDC SET LEADCHNL RV PACING AMPLITUDE: 3.5 V
MDC IDC SET LEADCHNL RV SENSING SENSITIVITY: 0.3 mV
MDC IDC SET ZONE DETECTION INTERVAL: 300 ms
MDC IDC SET ZONE DETECTION INTERVAL: 430 ms
MDC IDC STAT BRADY RV PERCENT PACED: 0 %
Zone Setting Detection Interval: 370 ms

## 2014-05-18 NOTE — Progress Notes (Signed)
Wound check appointment. Steri-strips removed. Wound without redness or edema. Incision edges approximated, wound well healed. Normal device function. Thresholds, sensing, and impedances consistent with implant measurements. Device programmed at 3.5V for extra safety margin until 3 month visit. Histogram distribution appropriate for patient and level of activity. No ventricular arrhythmias noted. Patient educated about wound care, arm mobility, lifting restrictions, shock plan--information card/magnet given to pt. Alert tones demonstrated and pt aware to contact office if heard. ROV 06-22-14 with [facility].

## 2014-05-26 ENCOUNTER — Encounter: Payer: Self-pay | Admitting: Cardiovascular Disease

## 2014-05-26 ENCOUNTER — Other Ambulatory Visit: Payer: Self-pay | Admitting: Cardiovascular Disease

## 2014-06-22 ENCOUNTER — Encounter: Payer: Self-pay | Admitting: Cardiovascular Disease

## 2014-06-22 ENCOUNTER — Ambulatory Visit (INDEPENDENT_AMBULATORY_CARE_PROVIDER_SITE_OTHER): Payer: Medicare Other | Admitting: Cardiovascular Disease

## 2014-06-22 VITALS — BP 107/69 | HR 65 | Ht 67.0 in | Wt 197.6 lb

## 2014-06-22 DIAGNOSIS — I4729 Other ventricular tachycardia: Secondary | ICD-10-CM

## 2014-06-22 DIAGNOSIS — I25810 Atherosclerosis of coronary artery bypass graft(s) without angina pectoris: Secondary | ICD-10-CM

## 2014-06-22 DIAGNOSIS — I255 Ischemic cardiomyopathy: Secondary | ICD-10-CM

## 2014-06-22 DIAGNOSIS — I472 Ventricular tachycardia: Secondary | ICD-10-CM

## 2014-06-22 DIAGNOSIS — I5022 Chronic systolic (congestive) heart failure: Secondary | ICD-10-CM | POA: Diagnosis not present

## 2014-06-22 DIAGNOSIS — Z95810 Presence of automatic (implantable) cardiac defibrillator: Secondary | ICD-10-CM

## 2014-06-22 NOTE — Patient Instructions (Signed)
Remote monitoring is used to monitor your Pacemaker or ICD from home. This monitoring reduces the number of office visits required to check your device to one time per year. It allows us to monitor the functioning of your device to ensure it is working properly. You are scheduled for a device check from home on September 23, 2014. You may send your transmission at any time that day. If you have a wireless device, the transmission will be sent automatically. After your physician reviews your transmission, you will receive a postcard with your next transmission date.  Dr. Croitoru recommends that you schedule a follow-up appointment in: ONE YEAR     

## 2014-06-22 NOTE — Progress Notes (Signed)
Patient ID: Ronnie Nelson, male   DOB: May 10, 1945, 69 y.o.   MRN: [id number]      Cardiology Office Note   Date:  06/23/2014   ID:  Ronnie Nelson, DOB 01/21/1946, MRN [id number]  PCP:  Coy Saunas, MD  Cardiologist:   Sanda Klein, MD   Chief Complaint  Patient presents with  . ROV 2 months    patient reports he sleeps more than normal, otherwise, no complaints      History of Present Illness: Ronnie Nelson is a 69 y.o. male who presents for follow-up for coronary artery disease with severe ischemic cardiomyopathy status post recent defibrillator generator change in ventricular lead revision. His surgical site is healed very well. Device check today shows normal function. Battery voltage is at beginning of life, ventricular lead impedance is 589 home (high-voltage 72 ohm), ventricular pacing threshold 0.625 V at 0.4 ms pulse width, sensed R waves 5.5 mV (considerable difficulty was encountered finding appropriate voltage R waves at the time of lead implantation).  Four episodes of nonsustained ventricular tachycardia with a maximum duration of 3 seconds at a maximum rate of 188 bpm have been recorded. This is similar to his previous prevalence of ventricular arrhythmia. None of these were in the treatment zone. No atrial fibrillation or other episodes of atrial mode switch have occurred.  He denies problems with exertional angina or dyspnea specifically inquires about whether or not he could mow his lawn, which I think would be fine. He has NYHA functional class I-II status. He denies syncope or palpitations or lower extremity edema.  He has a long-standing history of coronary artery disease and had bypass surgery in 1999. Has not required coronary angiography since the year 2000 when all his grafts were found to be patent. He does not have angina pectoris and a nuclear stress test performed roughly one year ago did not show reversible ischemia, but did show very extensive scar in the  distribution of all 3 major coronary arteries.  Left ventricular systolic function is at least mildly depressed. EF was estimated to be 40-45% on an echo performed last December, but a nuclear stress test performed in October of last year showed an ejection fraction of 26%. He does not have overt symptoms of congestive heart failure at this time and is remarkably active. He requires a very low dose of loop diuretic.  His first defibrillator that was implanted in 2009 and he had a generator change in January 2016. Therapy has not been delivered since 2011 when he received antitachycardia pacing. I don't think he has ever had a shock. The device does report frequent episodes of nonsustained ventricular tachycardia and occasional episodes of brief sustained VT of up to 25 seconds in duration, usually around 150 beats per minute. These have not been symptomatic.  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   Past Medical History  Diagnosis Date  . DM (diabetes mellitus)   . Hyperlipidemia   . CAD (coronary artery disease)     a. CABG 1996: SVG to LAD,LIMA to CX,SVG to RCA  . Ischemic cardiomyopathy     a.s/p ICD placement  . ICD (implantable cardioverter-defibrillator) in place     a. Generator Medtronic Evera MRI XT VR model [id number] serial number [id number]  . HTN (hypertension)     Past Surgical History  Procedure Laterality Date  . Coronary artery  bypass graft  [place]  . Cardiac catheterization  11/14/1998    severe depressed LV systolic function EF 67-59%  . Cardiac defibrillator placement  06/08/07    Medtronic  . Nm myocar perf wall motion  11/21/2011    no significant ischemia  . Implantable cardioverter defibrillator generator change N/A 05/09/2014    Procedure: IMPLANTABLE CARDIOVERTER DEFIBRILLATOR GENERATOR CHANGE;  Surgeon: Sanda Klein, MD;  Location: [facility] CATH LAB;  Service: Cardiovascular;  Laterality: N/A;     Current Outpatient Prescriptions  Medication Sig Dispense Refill  . aspirin EC 81 MG tablet Take 81 mg by mouth daily.    . carvedilol (COREG) 25 MG tablet TAKE 3 TABLETS (75 MG TOTAL) BY MOUTH 2 (TWO) TIMES DAILY WITH A MEAL. 180 tablet 6  . digoxin (LANOXIN) 0.125 MG tablet Take 0.0625 mg by mouth daily.     Marland Kitchen ezetimibe (ZETIA) 10 MG tablet Take 10 mg by mouth at bedtime.     . furosemide (LASIX) 20 MG tablet Take 20 mg by mouth daily.     . methocarbamol (ROBAXIN) 750 MG tablet Take 750 mg by mouth every 8 (eight) hours as needed for muscle spasms (shoulder pain).     . niacin (NIASPAN) 500 MG CR tablet Take 500 mg by mouth at bedtime.   5  . nitroGLYCERIN (NITROSTAT) 0.4 MG SL tablet Place 0.4 mg under the tongue every 5 (five) minutes as needed for chest pain.    Marland Kitchen omeprazole (PRILOSEC) 20 MG capsule Take 20 mg by mouth 2 (two) times daily.     . ramipril (ALTACE) 10 MG capsule Take 10 mg by mouth daily.    . simvastatin (ZOCOR) 80 MG tablet Take 80 mg by mouth at bedtime.    . sucralfate (CARAFATE) 1 G tablet Take 1 g by mouth 4 (four) times daily -  with meals and at bedtime.      No current facility-administered medications for this visit.    Allergies:   Review of patient's allergies indicates no known allergies.    Social History:  The patient  reports that he quit smoking about 19 years ago. He does not have any smokeless tobacco history on file. He reports that he does not drink alcohol or use illicit drugs.    ROS:  Please see the history of present illness.    Otherwise, review of systems positive for none.   All other systems are reviewed and negative.    PHYSICAL EXAM: VS:  BP 107/69 mmHg  Pulse 65  Ht 5\' 7"  (1.702 m)  Wt 197 lb 9.6 oz (89.631 kg)  BMI 30.94 kg/m2 , BMI Body mass index is 30.94 kg/(m^2).  General: Alert, oriented x3, no distress Head: no evidence of trauma,  PERRL, EOMI, no exophtalmos or lid lag, no myxedema, no xanthelasma; normal ears, nose and oropharynx Neck: normal jugular venous pulsations and no hepatojugular reflux; brisk carotid pulses without delay and no carotid bruits Chest: clear to auscultation, no signs of consolidation by percussion or palpation, normal fremitus, symmetrical and full respiratory excursions, healthy left subclavian pacemaker site Cardiovascular: normal position and quality of the apical impulse, regular rhythm, normal first and second heart sounds, no murmurs, rubs or gallops Abdomen: no tenderness or distention, no masses by palpation, no abnormal pulsatility or arterial bruits, normal bowel sounds, no hepatosplenomegaly Extremities: no clubbing, cyanosis or edema; 2+ radial, ulnar and brachial pulses bilaterally; 2+ right femoral, posterior tibial and dorsalis pedis  pulses; 2+ left femoral, posterior tibial and dorsalis pedis pulses; no subclavian or femoral bruits Neurological: grossly nonfocal Psych: euthymic mood, full affect   EKG:  EKG is not ordered today.   Recent Labs: 05/03/2014: ALT 18; BUN 17; Creatinine 1.20; Hemoglobin 14.5; Platelets 154; Potassium 4.6; Sodium 146*    Lipid Panel No results found for: CHOL, TRIG, HDL, CHOLHDL, VLDL, LDLCALC, LDLDIRECT    Wt Readings from Last 3 Encounters:  06/22/14 197 lb 9.6 oz (89.631 kg)  05/10/14 202 lb 13.2 oz (92 kg)  04/27/14 204 lb 14.4 oz (92.942 kg)       ASSESSMENT AND PLAN:  Normal defibrillator function. Ventricular lead output reduced to 2.5 V is 0.4 ms pulse width. Return to every 3 month [facility] follow-up.  Compensated combined systolic/diastolic heart failure secondary to severe ischemic cardiomyopathy. No symptoms of coronary insufficiency.   Current medicines are reviewed at length with the patient today.  The patient does not have concerns regarding medicines.  The following changes have been made:  no change  Labs/ tests  ordered today include:  No orders of the defined types were placed in this encounter.   Patient Instructions  Remote monitoring is used to monitor your Pacemaker or ICD from home. This monitoring reduces the number of office visits required to check your device to one time per year. It allows [place] to monitor the functioning of your device to ensure it is working properly. You are scheduled for a device check from home on AUGUST 13,2016. You may send your transmission at any time that day. If you have a wireless device, the transmission will be sent automatically. After your physician reviews your transmission, you will receive a postcard with your next transmission date.  Dr. Sallyanne Kuster recommends that you schedule a follow-up appointment in: ONE YEAR      SignedSanda Klein, MD  06/23/2014 4:47 PM    Sanda Klein, MD, [facility] [phone] office [phone] pager

## 2014-06-28 LAB — CUP PACEART INCLINIC DEVICE CHECK
Date Time Interrogation Session: 20160512185345
HIGH POWER IMPEDANCE MEASURED VALUE: 72 Ohm
Lead Channel Impedance Value: 608 Ohm
Lead Channel Pacing Threshold Amplitude: 0.625 V
Lead Channel Pacing Threshold Pulse Width: 0.4 ms
Lead Channel Sensing Intrinsic Amplitude: 5.5 mV
Lead Channel Sensing Intrinsic Amplitude: 5.5 mV
Lead Channel Setting Pacing Pulse Width: 0.4 ms
MDC IDC MSMT BATTERY REMAINING LONGEVITY: 137 mo
MDC IDC MSMT BATTERY VOLTAGE: 3.15 V
MDC IDC MSMT LEADCHNL RV IMPEDANCE VALUE: 456 Ohm
MDC IDC SET LEADCHNL RV PACING AMPLITUDE: 3.5 V
MDC IDC SET LEADCHNL RV SENSING SENSITIVITY: 0.3 mV
MDC IDC SET ZONE DETECTION INTERVAL: 430 ms
MDC IDC STAT BRADY RV PERCENT PACED: 0.01 %
Zone Setting Detection Interval: 300 ms
Zone Setting Detection Interval: 370 ms

## 2014-07-25 ENCOUNTER — Encounter: Payer: Self-pay | Admitting: Cardiovascular Disease

## 2014-09-25 ENCOUNTER — Encounter: Payer: Self-pay | Admitting: Cardiovascular Disease

## 2014-09-25 ENCOUNTER — Ambulatory Visit (INDEPENDENT_AMBULATORY_CARE_PROVIDER_SITE_OTHER): Payer: Medicare Other | Admitting: *Deleted

## 2014-09-25 DIAGNOSIS — I255 Ischemic cardiomyopathy: Secondary | ICD-10-CM | POA: Diagnosis not present

## 2014-09-25 NOTE — Progress Notes (Signed)
Remote ICD transmission.   

## 2014-09-26 ENCOUNTER — Telehealth: Payer: Self-pay | Admitting: Cardiovascular Disease

## 2014-09-26 NOTE — Telephone Encounter (Signed)
Did we receive his transmission yesterday.

## 2014-09-26 NOTE — Telephone Encounter (Signed)
Sending to device clinic at [address] for patients question as to if we received his transmission yesterday

## 2014-09-27 NOTE — Telephone Encounter (Signed)
Informed pt that his transmission was received.  

## 2014-09-28 LAB — CUP PACEART REMOTE DEVICE CHECK
Battery Remaining Longevity: 135 mo
Battery Voltage: 3.1 V
Brady Statistic RV Percent Paced: 0.01 %
Date Time Interrogation Session: 20160815052504
HIGH POWER IMPEDANCE MEASURED VALUE: 78 Ohm
Lead Channel Impedance Value: 551 Ohm
Lead Channel Sensing Intrinsic Amplitude: 4.5 mV
Lead Channel Setting Pacing Amplitude: 2 V
MDC IDC MSMT LEADCHNL RV IMPEDANCE VALUE: 456 Ohm
MDC IDC MSMT LEADCHNL RV PACING THRESHOLD AMPLITUDE: 0.625 V
MDC IDC MSMT LEADCHNL RV PACING THRESHOLD PULSEWIDTH: 0.4 ms
MDC IDC SET LEADCHNL RV PACING PULSEWIDTH: 0.4 ms
MDC IDC SET LEADCHNL RV SENSING SENSITIVITY: 0.3 mV
MDC IDC SET ZONE DETECTION INTERVAL: 370 ms
Zone Setting Detection Interval: 300 ms
Zone Setting Detection Interval: 430 ms

## 2014-10-11 ENCOUNTER — Encounter: Payer: Self-pay | Admitting: Cardiology

## 2014-12-22 ENCOUNTER — Telehealth: Payer: Self-pay | Admitting: Cardiovascular Disease

## 2014-12-22 NOTE — Telephone Encounter (Signed)
Patient called in and wanted to know when he can use his arm - he had an ICD in march 2016 and was told to not do much with that extremity. He states he would like to wash his back. Informed him since he is 6+ months post ICD placement he should be fine to just that arm to wash his back. Informed him that any movement restrictions are usually around the days/few weeks post implant. He voiced understanding.

## 2014-12-22 NOTE — Telephone Encounter (Signed)
Please call,he needs to ask a question about how much can he use his arm.

## 2014-12-25 ENCOUNTER — Telehealth: Payer: Self-pay | Admitting: Cardiology

## 2014-12-25 ENCOUNTER — Ambulatory Visit (INDEPENDENT_AMBULATORY_CARE_PROVIDER_SITE_OTHER): Payer: Medicare Other | Admitting: *Deleted

## 2014-12-25 DIAGNOSIS — I255 Ischemic cardiomyopathy: Secondary | ICD-10-CM

## 2014-12-25 NOTE — Progress Notes (Signed)
Remote ICD transmission.   

## 2014-12-25 NOTE — Telephone Encounter (Signed)
Spoke with pt and reminded pt of remote transmission that is due today. Pt verbalized understanding.   

## 2015-01-01 ENCOUNTER — Other Ambulatory Visit: Payer: Self-pay | Admitting: Cardiovascular Disease

## 2015-01-03 LAB — CUP PACEART REMOTE DEVICE CHECK
Brady Statistic RV Percent Paced: 0 %
Date Time Interrogation Session: 20161114170337
HighPow Impedance: 67 Ohm
Implantable Lead Implant Date: 20160329
Implantable Lead Location: 753860
Lead Channel Pacing Threshold Amplitude: 0.625 V
Lead Channel Pacing Threshold Pulse Width: 0.4 ms
Lead Channel Sensing Intrinsic Amplitude: 3.5 mV
Lead Channel Setting Sensing Sensitivity: 0.3 mV
MDC IDC MSMT BATTERY REMAINING LONGEVITY: 134 mo
MDC IDC MSMT BATTERY VOLTAGE: 3.06 V
MDC IDC MSMT LEADCHNL RV IMPEDANCE VALUE: 418 Ohm
MDC IDC MSMT LEADCHNL RV IMPEDANCE VALUE: 532 Ohm
MDC IDC MSMT LEADCHNL RV SENSING INTR AMPL: 3.5 mV
MDC IDC SET LEADCHNL RV PACING AMPLITUDE: 2 V
MDC IDC SET LEADCHNL RV PACING PULSEWIDTH: 0.4 ms

## 2015-01-10 ENCOUNTER — Encounter: Payer: Self-pay | Admitting: Cardiology

## 2015-02-06 ENCOUNTER — Other Ambulatory Visit: Payer: Self-pay | Admitting: Cardiovascular Disease

## 2015-02-06 MED ORDER — CARVEDILOL 25 MG PO TABS: 25.0000 mg | ORAL_TABLET | Freq: Two times a day (BID) | ORAL | Status: DC

## 2015-03-26 ENCOUNTER — Ambulatory Visit (INDEPENDENT_AMBULATORY_CARE_PROVIDER_SITE_OTHER): Payer: Medicare Other | Admitting: *Deleted

## 2015-03-26 ENCOUNTER — Telehealth: Payer: Self-pay | Admitting: Cardiovascular Disease

## 2015-03-26 ENCOUNTER — Telehealth: Payer: Self-pay | Admitting: Cardiology

## 2015-03-26 DIAGNOSIS — I255 Ischemic cardiomyopathy: Secondary | ICD-10-CM | POA: Diagnosis not present

## 2015-03-26 NOTE — Telephone Encounter (Signed)
Spoke with pt and reminded pt of remote transmission that is due today. Pt verbalized understanding.   

## 2015-03-26 NOTE — Progress Notes (Signed)
Remote ICD transmission.   

## 2015-03-26 NOTE — Telephone Encounter (Signed)
Pt wants to know if you received his transmission from today.

## 2015-03-26 NOTE — Telephone Encounter (Signed)
Spoke w/ pt and informed him that we had not received the transmission. instructed pt how to send transmission. Transmission received and pt aware.

## 2015-03-28 ENCOUNTER — Encounter: Payer: Self-pay | Admitting: Cardiology

## 2015-03-28 LAB — CUP PACEART REMOTE DEVICE CHECK
Battery Remaining Longevity: 133 mo
Battery Voltage: 3.04 V
Brady Statistic RV Percent Paced: 0 %
HIGH POWER IMPEDANCE MEASURED VALUE: 64 Ohm
Implantable Lead Implant Date: 20160329
Lead Channel Impedance Value: 418 Ohm
Lead Channel Impedance Value: 532 Ohm
Lead Channel Sensing Intrinsic Amplitude: 3 mV
Lead Channel Sensing Intrinsic Amplitude: 3 mV
Lead Channel Setting Pacing Amplitude: 2 V
Lead Channel Setting Pacing Pulse Width: 0.4 ms
MDC IDC LEAD LOCATION: 753860
MDC IDC MSMT LEADCHNL RV PACING THRESHOLD AMPLITUDE: 0.5 V
MDC IDC MSMT LEADCHNL RV PACING THRESHOLD PULSEWIDTH: 0.4 ms
MDC IDC SESS DTM: 20170213211856
MDC IDC SET LEADCHNL RV SENSING SENSITIVITY: 0.3 mV

## 2015-04-30 ENCOUNTER — Telehealth: Payer: Self-pay | Admitting: Cardiovascular Disease

## 2015-04-30 NOTE — Telephone Encounter (Signed)
New message      *STAT* If patient is at the pharmacy, call can be transferred to refill team.    1. Which medications need to be refilled? (please list name of each medication and dose if known) carvedilol  25 mg  2. Which pharmacy/location (including street and city if local pharmacy) is medication to be sent to? [facility] [place]   3. Do they need a 30 day or 90 day supply? 30 days - need to talk nurse regarding medication

## 2015-04-30 NOTE — Telephone Encounter (Signed)
Med clarified based on last dosing.

## 2015-05-21 ENCOUNTER — Telehealth: Payer: Self-pay | Admitting: Cardiovascular Disease

## 2015-05-21 NOTE — Telephone Encounter (Signed)
error 

## 2015-05-23 ENCOUNTER — Telehealth: Payer: Self-pay | Admitting: Cardiovascular Disease

## 2015-05-23 NOTE — Telephone Encounter (Signed)
No answer/no connection when dialed.

## 2015-05-23 NOTE — Telephone Encounter (Signed)
New message   Pt got a call from [place] to do an MRI   He states that he do not know why they are calling  And he said he could not answer the questions   Is it a possible referral from Dr.Croitoru to [facility] for pt to have an MRI?

## 2015-05-25 NOTE — Telephone Encounter (Signed)
Spoke with patient and he is supposed to have a MRI at [facility]. Patient was told to contact our office regarding his device. Patient said that he didn't know anything about his device because he only had a 5th grade education and said he just didn't understand much. Nurse advised patient that [facility] has the ability to see his chart through care everywhere and they could see the type of device he has. Patient also informed that the office at [facility] could contact us directly with any questions or concerns r/e his device. Patient verbalized understanding and said that he would let the office that is doing his MRI know to contact our office.

## 2015-06-04 ENCOUNTER — Telehealth: Payer: Self-pay | Admitting: Cardiovascular Disease

## 2015-06-04 NOTE — Telephone Encounter (Signed)
Close encounter ° °*ERROR* °

## 2015-07-02 ENCOUNTER — Ambulatory Visit (INDEPENDENT_AMBULATORY_CARE_PROVIDER_SITE_OTHER): Payer: Medicare Other | Admitting: Cardiovascular Disease

## 2015-07-02 ENCOUNTER — Encounter: Payer: Self-pay | Admitting: Cardiovascular Disease

## 2015-07-02 VITALS — BP 93/66 | HR 72 | Ht 67.0 in | Wt 190.6 lb

## 2015-07-02 DIAGNOSIS — E1159 Type 2 diabetes mellitus with other circulatory complications: Secondary | ICD-10-CM

## 2015-07-02 DIAGNOSIS — E785 Hyperlipidemia, unspecified: Secondary | ICD-10-CM

## 2015-07-02 DIAGNOSIS — I255 Ischemic cardiomyopathy: Secondary | ICD-10-CM | POA: Diagnosis not present

## 2015-07-02 DIAGNOSIS — I472 Ventricular tachycardia: Secondary | ICD-10-CM

## 2015-07-02 DIAGNOSIS — I5022 Chronic systolic (congestive) heart failure: Secondary | ICD-10-CM

## 2015-07-02 DIAGNOSIS — Z79899 Other long term (current) drug therapy: Secondary | ICD-10-CM

## 2015-07-02 DIAGNOSIS — Z95810 Presence of automatic (implantable) cardiac defibrillator: Secondary | ICD-10-CM

## 2015-07-02 DIAGNOSIS — I25810 Atherosclerosis of coronary artery bypass graft(s) without angina pectoris: Secondary | ICD-10-CM

## 2015-07-02 DIAGNOSIS — I4729 Other ventricular tachycardia: Secondary | ICD-10-CM

## 2015-07-02 NOTE — Progress Notes (Signed)
Patient ID: Ronnie Nelson, male   DOB: 1945-10-11, 70 y.o.   MRN: [id number]    Cardiology Office Note    Date:  07/02/2015   ID:  DEERIC KNAUF, DOB 1945/04/10, MRN [id number]  PCP:  Coy Saunas, MD  Cardiologist:   Sanda Klein, MD   Chief complaint: Epigastric discomfort; defibrillator check   History of Present Illness:  Ronnie Nelson is a 70 y.o. male who presents for follow-up for coronary artery disease with severe ischemic cardiomyopathy, s/p CABG, s/p ICD  He denies any cardiovascular complaints. He is able to perform usual activity without angina or dyspnea. He notes some very mild dyspnea when he has to climb a steep ramp leading to his grandchild's school. He does not have to stop to rest. He denies dizziness or palpitations and has not experienced syncope or defibrillator discharges. He does not have lower extremity edema. He complains of epigastric and left lower chest discomfort aggravated by food, relieved by antacids.  Defibrillator interrogation shows normal device function. The current generator (Medtronic Evera XT, change out performed roughly one year ago) has about 11 years of longevity. He does not require ventricular pacing. Lead parameters are excellent. There have been a couple of episodes of nonsustained VT with a maximum of 9 beats. A few episodes of VT in the monitor zone are actual sinus tachycardia. His thoracic impedance was slightly out of range during the holiday months last winter, but has been completely normal recently. Activity level is fairly constant at roughly 4.6 hours per day.  He has a long-standing history of coronary artery disease and had bypass surgery in 1999. Has not required coronary angiography since the year 2000 when all his grafts were found to be patent. He does not have angina pectoris and a nuclear stress test performed roughly one year ago did not show reversible ischemia, but did show very extensive scar in the distribution of all 3 major  coronary arteries.  Left ventricular systolic function is at least mildly depressed. EF was estimated to be 40-45% on an echo performed 2013, but his last nuclear stress test showed an ejection fraction of 26%. He requires a very low dose of loop diuretic.  His first defibrillator that was implanted in 2009 and he had a generator change in January 2016. Therapy has not been delivered since 2011 when he received antitachycardia pacing. I don't think he has ever had a shock. The device does report frequent episodes of nonsustained ventricular tachycardia and occasional episodes of brief sustained "VT" of up to 25 seconds in duration, usually around 150 beats per minute, probably all sinus tachycardia. These have not been symptomatic.  He has very well compensated diabetes mellitus, hyperlipidemia and hypertension and has had lab tests consistent with subclinical hyperthyroidism.  Past Medical History  Diagnosis Date  . DM (diabetes mellitus) ([place])   . Hyperlipidemia   . CAD (coronary artery disease)     a. CABG 1996: SVG to LAD,LIMA to CX,SVG to RCA  . Ischemic cardiomyopathy     a.s/p ICD placement  . ICD (implantable cardioverter-defibrillator) in place     a. Generator Medtronic Evera MRI XT VR model [id number] serial number [id number]  . HTN (hypertension)     Past Surgical History  Procedure Laterality Date  . Coronary artery bypass graft  [place]  . Cardiac catheterization  11/14/1998    severe depressed LV systolic function EF 0000000  . Cardiac defibrillator placement  06/08/07    Medtronic  . Nm myocar perf wall motion  11/21/2011    no significant ischemia  . Implantable cardioverter defibrillator generator change N/A 05/09/2014    Procedure: IMPLANTABLE CARDIOVERTER DEFIBRILLATOR GENERATOR CHANGE;  Surgeon: Sanda Klein, MD;  Location: [facility] CATH LAB;  Service: Cardiovascular;  Laterality: N/A;    Current Medications: Outpatient Prescriptions Prior to Visit    Medication Sig Dispense Refill  . aspirin EC 81 MG tablet Take 81 mg by mouth daily.    . carvedilol (COREG) 25 MG tablet Take 1 tablet (25 mg total) by mouth 2 (two) times daily with a meal. 180 tablet 5  . digoxin (LANOXIN) 0.125 MG tablet Take 0.0625 mg by mouth daily.     Marland Kitchen ezetimibe (ZETIA) 10 MG tablet Take 10 mg by mouth at bedtime.     . furosemide (LASIX) 20 MG tablet Take 20 mg by mouth daily.     . niacin (NIASPAN) 500 MG CR tablet Take 500 mg by mouth at bedtime.   5  . nitroGLYCERIN (NITROSTAT) 0.4 MG SL tablet Place 0.4 mg under the tongue every 5 (five) minutes as needed for chest pain.    Marland Kitchen omeprazole (PRILOSEC) 20 MG capsule Take 20 mg by mouth 2 (two) times daily.     . ramipril (ALTACE) 10 MG capsule Take 10 mg by mouth daily.    . simvastatin (ZOCOR) 80 MG tablet Take 80 mg by mouth at bedtime.    . sucralfate (CARAFATE) 1 G tablet Take 1 g by mouth 4 (four) times daily -  with meals and at bedtime.     . methocarbamol (ROBAXIN) 750 MG tablet Take 750 mg by mouth every 8 (eight) hours as needed for muscle spasms (shoulder pain). Reported on 07/02/2015     No facility-administered medications prior to visit.     Allergies:   Review of patient's allergies indicates no known allergies.   Social History   Social History  . Marital Status: Married    Spouse Name: N/A  . Number of Children: N/A  . Years of Education: N/A   Social History Main Topics  . Smoking status: Former Smoker    Quit date: 02/10/1995  . Smokeless tobacco: None  . Alcohol Use: No  . Drug Use: No  . Sexual Activity: Not Asked   Other Topics Concern  . None   Social History Narrative      ROS:   Please see the history of present illness.    ROS All other systems reviewed and are negative.   PHYSICAL EXAM:   VS:  BP 93/66 mmHg  Pulse 72  Ht 5\' 7"  (1.702 m)  Wt 86.456 kg (190 lb 9.6 oz)  BMI 29.85 kg/m2  SpO2 97%   GEN: Well nourished, well developed, in no acute distress HEENT:  normal Neck: no JVD, carotid bruits, or masses Cardiac: RRR; no murmurs, rubs, or gallops,no edema ; healthy defibrillator site Respiratory:  clear to auscultation bilaterally, normal work of breathing GI: soft, nontender, nondistended, + BS MS: no deformity or atrophy Skin: warm and dry, no rash Neuro:  Alert and Oriented x 3, Strength and sensation are intact Psych: euthymic mood, full affect  Wt Readings from Last 3 Encounters:  07/02/15 86.456 kg (190 lb 9.6 oz)  06/22/14 89.631 kg (197 lb 9.6 oz)  05/10/14 92 kg (202 lb 13.2 oz)      Studies/Labs Reviewed:   EKG:  EKG is ordered today.  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 The QTC is 453 ms    ASSESSMENT:    1. Chronic systolic CHF (congestive heart failure) ([place])   2. Coronary artery disease involving coronary bypass graft of native heart without angina pectoris   3. Dyslipidemia   4. 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 NSVT (nonsustained ventricular tachycardia) (HCC)   6. ICD (implantable cardioverter-defibrillator) in place   7. Medication management      PLAN:  In order of problems listed above:  1. CHF: NYHA class 2, clinically euvolemic on a very low dose of loop diuretic. He is on appropriate treatment with full dose ACE inhibitor and carvedilol. 2. CAD s/p CABG: No symptoms of angina pectoris. His left lower chest discomfort is convincingly gastrointestinal in pattern. Recommend over-the-counter H2 blockers in addition to his Carafate 3. HLP: Time to repeat his lipid profile. Doubtful whether the long acting niacin is of benefit. Low threshold for discontinuation unless his triglycerides are quite high. 4. DM: Reports good control but  uncertain what his last hemoglobin A1c was. Get labs from Dr. Rosana Hoes 5. NSVT: He has a pattern of infrequent brief episodes of nonsustained VT that have always been asymptomatic. The longer episodes of tachycardia recorded by his device with rates of around 150 bpm look like sinus tachycardia with gradual onset and resolution. 6. ICD: Normal device function. Remote download in 3 months and office visit in 6 months    Medication Adjustments/Labs and Tests Ordered: Current medicines are reviewed at length with the patient today.  Concerns regarding medicines are outlined above.  Medication changes, Labs and Tests ordered today are listed in the Patient Instructions below. Patient Instructions  Medication Instructions: Dr Sallyanne Kuster recommends that you continue on your current medications as directed. Please refer to the Current Medication list given to you today.  Labwork: Your physician recommends that you return for lab work at your earliest [place].  Testing/Procedures: 1. Remote Defibrillator Download - Remote monitoring is used to monitor your Pacemaker of ICD from home. This monitoring reduces the number of office visits required to check your device to one time per year. It allows [place] to keep an eye on the functioning of your device to ensure it is working properly. You are scheduled for a device check from home on Wednesday, August 23rd, 2017. You may send your transmission at any time that day. If you have a wireless device, the transmission will be sent automatically. After your physician reviews your transmission, you will receive a postcard with your next transmission date.  Follow-up: Dr Sallyanne Kuster recommends that you schedule a follow-up appointment in 6 months. You will receive a reminder letter in the mail two months in advance. If you don't receive a letter, please call our office to schedule the follow-up appointment.  If you need a refill on your cardiac medications  before your next appointment, please call your pharmacy.    Signed, Sanda Klein, MD  07/02/2015 1:20 PM    [facility] [address], [place], [place]  [address] Phone: [phone]; Fax: [phone]

## 2015-07-02 NOTE — Patient Instructions (Signed)
Medication Instructions: Dr Sallyanne Kuster recommends that you continue on your current medications as directed. Please refer to the Current Medication list given to you today.  Labwork: Your physician recommends that you return for lab work at your earliest [place].  Testing/Procedures: 1. Remote Defibrillator Download - Remote monitoring is used to monitor your Pacemaker of ICD from home. This monitoring reduces the number of office visits required to check your device to one time per year. It allows [place] to keep an eye on the functioning of your device to ensure it is working properly. You are scheduled for a device check from home on Wednesday, August 23rd, 2017. You may send your transmission at any time that day. If you have a wireless device, the transmission will be sent automatically. After your physician reviews your transmission, you will receive a postcard with your next transmission date.  Follow-up: Dr Sallyanne Kuster recommends that you schedule a follow-up appointment in 6 months. You will receive a reminder letter in the mail two months in advance. If you don't receive a letter, please call our office to schedule the follow-up appointment.  If you need a refill on your cardiac medications before your next appointment, please call your pharmacy.

## 2015-07-03 LAB — CUP PACEART INCLINIC DEVICE CHECK
Brady Statistic RV Percent Paced: 0.01 %
Date Time Interrogation Session: 20170522154921
HighPow Impedance: 71 Ohm
Implantable Lead Implant Date: 20160329
Implantable Lead Location: 753860
Lead Channel Impedance Value: 532 Ohm
Lead Channel Pacing Threshold Amplitude: 0.625 V
Lead Channel Pacing Threshold Pulse Width: 0.4 ms
Lead Channel Sensing Intrinsic Amplitude: 2.25 mV
MDC IDC MSMT BATTERY REMAINING LONGEVITY: 131 mo
MDC IDC MSMT BATTERY VOLTAGE: 3.03 V
MDC IDC MSMT LEADCHNL RV IMPEDANCE VALUE: 418 Ohm
MDC IDC MSMT LEADCHNL RV SENSING INTR AMPL: 2.25 mV
MDC IDC SET LEADCHNL RV PACING AMPLITUDE: 2 V
MDC IDC SET LEADCHNL RV PACING PULSEWIDTH: 0.4 ms
MDC IDC SET LEADCHNL RV SENSING SENSITIVITY: 0.3 mV

## 2015-07-04 ENCOUNTER — Encounter: Payer: Self-pay | Admitting: Cardiovascular Disease

## 2015-07-16 NOTE — Addendum Note (Signed)
Addended by: Therisa Doyne on: 07/16/2015 01:09 PM   Modules accepted: Orders

## 2015-07-20 ENCOUNTER — Telehealth: Payer: Self-pay | Admitting: Cardiovascular Disease

## 2015-07-20 NOTE — Telephone Encounter (Signed)
Returned call to patient he stated he was told after he had lab work done Dr.Croitoru was going stop coreg.Stated she had lab done several weeks ago at [facility] in [place].Advised I will send message to Dr.Croitoru.

## 2015-07-20 NOTE — Telephone Encounter (Signed)
New message     Pt c/o medication issue:  1. Name of Medication: Carveddol 2. How are you currently taking this medication (dosage and times per day)? 25 mg po daily  3. Are you having a reaction (difficulty breathing--STAT)? no  4. What is your medication issue? The pt states the Md was going to take him off the medication.   The pt also wants to go over blood work with the nurse.

## 2015-07-23 ENCOUNTER — Telehealth: Payer: Self-pay | Admitting: Cardiovascular Disease

## 2015-07-23 NOTE — Telephone Encounter (Signed)
He should NOT stop the carvedilol.  We were discussing STOPPING NIACIN. Niacin can be stopped unless his TG are > 300. I don't have access to the labs he had drawn.

## 2015-07-23 NOTE — Telephone Encounter (Signed)
LM on VM for Sonia to return my call regarding MRI clearance for Mr. Reichle.

## 2015-07-23 NOTE — Telephone Encounter (Signed)
New message      Request for surgical clearance:  What type of surgery is being performed? MRI When is this surgery scheduled?  Pending clearance Are there any medications that need to be held prior to surgery and how long? Pt has a defibulator/pacemaker 1. Name of physician performing surgery? Dr Ella Jubilee  2. What is your office phone and fax number? Fax [phone]  Attn Alleen Borne

## 2015-07-24 NOTE — Telephone Encounter (Signed)
Sonia calling back. I explained that the patient does have an MRI conditional device implanted March 2016, but he has an "abandoned" lead which may rule out the possibility of having an MRI. I gave her all of his ICD/lead model numbers and the number to [employer] tech services to check into device compatibility with MRI.

## 2015-07-27 NOTE — Telephone Encounter (Signed)
Called patient on Monday, 07/23/15. Notified patient that he shouldn't stop the carvedilol and that I would request blood work from [place] the hospital today (07/27/15) and they will not release patient's blood work without a signed release from patient.  Returned call to patient. Presented him with 2 options - 1) he could come to [facility] to sign form or 2) he could go pick up the blood work from [place] himself and call back. Patient stated that he would go pick up the blood work himself.

## 2015-07-30 ENCOUNTER — Telehealth: Payer: Self-pay | Admitting: Cardiovascular Disease

## 2015-07-30 NOTE — Telephone Encounter (Signed)
Follow-up    The pt had blood work in [employer] about 2 or 3 weeks and the pt is going to have the lab to fax to the nurse. So he doesn't have to go get results. 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

## 2015-08-29 ENCOUNTER — Telehealth: Payer: Self-pay | Admitting: Cardiovascular Disease

## 2015-08-29 NOTE — Telephone Encounter (Signed)
NEw message   Pt call requesting to speak with RN to see if transmission was received. Please call back to discuss

## 2015-08-29 NOTE — Telephone Encounter (Signed)
Pt wife is going to have pt call back.

## 2015-08-29 NOTE — Telephone Encounter (Signed)
Spoke w/ pt and informed him that his remote transmission was received. Once device tech reviews the transmission he will receive a letter. If there is anything abnormal w/ the reading someone will call him. Pt verbalized understanding.

## 2015-10-02 ENCOUNTER — Telehealth: Payer: Self-pay | Admitting: Cardiology

## 2015-10-02 ENCOUNTER — Ambulatory Visit (INDEPENDENT_AMBULATORY_CARE_PROVIDER_SITE_OTHER): Payer: Medicare Other | Admitting: *Deleted

## 2015-10-02 DIAGNOSIS — I5022 Chronic systolic (congestive) heart failure: Secondary | ICD-10-CM

## 2015-10-02 DIAGNOSIS — Z95810 Presence of automatic (implantable) cardiac defibrillator: Secondary | ICD-10-CM

## 2015-10-02 DIAGNOSIS — I255 Ischemic cardiomyopathy: Secondary | ICD-10-CM

## 2015-10-02 NOTE — Telephone Encounter (Signed)
LMOVM reminding pt to send remote transmission.   

## 2015-10-03 NOTE — Progress Notes (Signed)
Remote ICD transmission.   

## 2015-10-05 ENCOUNTER — Encounter: Payer: Self-pay | Admitting: Cardiology

## 2015-10-11 LAB — CUP PACEART REMOTE DEVICE CHECK
Battery Remaining Longevity: 129 mo
Battery Voltage: 3.02 V
Brady Statistic RV Percent Paced: 0.01 %
HIGH POWER IMPEDANCE MEASURED VALUE: 72 Ohm
Implantable Lead Implant Date: 20160329
Implantable Lead Location: 753860
Lead Channel Impedance Value: 532 Ohm
Lead Channel Pacing Threshold Pulse Width: 0.4 ms
Lead Channel Sensing Intrinsic Amplitude: 2.375 mV
Lead Channel Sensing Intrinsic Amplitude: 2.375 mV
Lead Channel Setting Pacing Amplitude: 2 V
Lead Channel Setting Pacing Pulse Width: 0.4 ms
MDC IDC MSMT LEADCHNL RV IMPEDANCE VALUE: 456 Ohm
MDC IDC MSMT LEADCHNL RV PACING THRESHOLD AMPLITUDE: 0.625 V
MDC IDC SESS DTM: 20170823140751
MDC IDC SET LEADCHNL RV SENSING SENSITIVITY: 0.3 mV

## 2016-01-01 ENCOUNTER — Encounter: Payer: Self-pay | Admitting: Cardiovascular Disease

## 2016-01-01 ENCOUNTER — Ambulatory Visit (INDEPENDENT_AMBULATORY_CARE_PROVIDER_SITE_OTHER): Payer: Medicare Other | Admitting: Cardiovascular Disease

## 2016-01-01 VITALS — BP 113/67 | HR 72 | Ht 67.0 in | Wt 186.6 lb

## 2016-01-01 DIAGNOSIS — I5022 Chronic systolic (congestive) heart failure: Secondary | ICD-10-CM

## 2016-01-01 DIAGNOSIS — E7800 Pure hypercholesterolemia, unspecified: Secondary | ICD-10-CM

## 2016-01-01 DIAGNOSIS — I25810 Atherosclerosis of coronary artery bypass graft(s) without angina pectoris: Secondary | ICD-10-CM

## 2016-01-01 DIAGNOSIS — I4729 Other ventricular tachycardia: Secondary | ICD-10-CM

## 2016-01-01 DIAGNOSIS — I472 Ventricular tachycardia: Secondary | ICD-10-CM

## 2016-01-01 DIAGNOSIS — I255 Ischemic cardiomyopathy: Secondary | ICD-10-CM

## 2016-01-01 DIAGNOSIS — Z95810 Presence of automatic (implantable) cardiac defibrillator: Secondary | ICD-10-CM | POA: Diagnosis not present

## 2016-01-01 DIAGNOSIS — E1159 Type 2 diabetes mellitus with other circulatory complications: Secondary | ICD-10-CM

## 2016-01-01 LAB — CUP PACEART INCLINIC DEVICE CHECK
Battery Remaining Longevity: 128 mo
Battery Voltage: 3.02 V
Date Time Interrogation Session: 20171121145109
HIGH POWER IMPEDANCE MEASURED VALUE: 58 Ohm
Implantable Lead Implant Date: 20160329
Lead Channel Impedance Value: 361 Ohm
Lead Channel Pacing Threshold Amplitude: 0.625 V
Lead Channel Sensing Intrinsic Amplitude: 2.25 mV
Lead Channel Setting Pacing Pulse Width: 0.4 ms
MDC IDC LEAD LOCATION: 753860
MDC IDC MSMT LEADCHNL RV IMPEDANCE VALUE: 456 Ohm
MDC IDC MSMT LEADCHNL RV PACING THRESHOLD PULSEWIDTH: 0.4 ms
MDC IDC MSMT LEADCHNL RV SENSING INTR AMPL: 2.25 mV
MDC IDC PG IMPLANT DT: 20160329
MDC IDC SET LEADCHNL RV PACING AMPLITUDE: 2 V
MDC IDC SET LEADCHNL RV SENSING SENSITIVITY: 0.3 mV
MDC IDC STAT BRADY RV PERCENT PACED: 0.01 %

## 2016-01-01 NOTE — Progress Notes (Signed)
Patient ID: Ronnie Nelson, male   DOB: 1945/12/28, 70 y.o.   MRN: [id number]    Cardiology Office Note    Date:  01/01/2016   ID:  Ronnie Nelson, DOB 1945/02/22, MRN [id number]  PCP:  Coy Saunas, MD  Cardiologist:   Sanda Klein, MD   Chief complaint: Epigastric discomfort; defibrillator check   History of Present Illness:  Ronnie Nelson is a 70 y.o. male who presents for follow-up for coronary artery disease with severe ischemic cardiomyopathy, s/p CABG, s/p ICD  He denies any cardiovascular complaints. He is able to perform usual activity without angina or dyspnea. He has dyspnea only with greater than usual physical activity. He reports that his right inguinal hernia is enlarging but it is readily reducible and is not causing any pain. He tells me that he was evaluated by a surgeon but observation was recommended.  Defibrillator interrogation shows normal device function. The current generator (Medtronic Evera XT, change out performed roughly 2 years ago) has about 10.5 years of longevity. He does not require ventricular pacing. Lead parameters are excellent. There have been a couple of episodes of nonsustained VT with a maximum of 9 beats. A few episodes of VT in the monitor zone are actual sinus tachycardia. His thoracic impedance was slightly out of range recently, but he remains asymptomatic,. Activity level is fairly constant at roughly 3-4 hours per day.  He has a long-standing history of coronary artery disease and had bypass surgery in 1999. Has not required coronary angiography since the year 2000 when all his grafts were found to be patent. He does not have angina pectoris and a nuclear stress test performed roughly one year ago did not show reversible ischemia, but did show very extensive scar in the distribution of all 3 major coronary arteries.  Left ventricular systolic function is at least mildly depressed. EF was estimated to be 40-45% on an echo performed 2013, but his last  nuclear stress test showed an ejection fraction of 26%. He requires a very low dose of loop diuretic.  His first defibrillator that was implanted in 2009 and he had a generator change in January 2016. Therapy has not been delivered since 2011 when he received antitachycardia pacing. I don't think he has ever had a shock. The device does report frequent episodes of nonsustained ventricular tachycardia and occasional episodes of brief sustained "VT" of up to 25 seconds in duration, usually around 150 beats per minute, probably all sinus tachycardia. These have not been symptomatic.  He has very well compensated diabetes mellitus, hyperlipidemia and hypertension and has had lab tests consistent with subclinical hyperthyroidism.  Past Medical History:  Diagnosis Date  . CAD (coronary artery disease)    a. CABG 1996: SVG to LAD,LIMA to CX,SVG to RCA  . DM (diabetes mellitus) ([place])   . HTN (hypertension)   . Hyperlipidemia   . ICD (implantable cardioverter-defibrillator) in place    a. Generator Medtronic Evera MRI [place] VR model [id number] serial number [id number]  . Ischemic cardiomyopathy    a.s/p ICD placement    Past Surgical History:  Procedure Laterality Date  . CARDIAC CATHETERIZATION  11/14/1998   severe depressed LV systolic function EF 0000000  . CARDIAC DEFIBRILLATOR PLACEMENT  06/08/07   Medtronic  . CORONARY ARTERY BYPASS GRAFT  1996   [facility]  . IMPLANTABLE CARDIOVERTER DEFIBRILLATOR GENERATOR CHANGE N/A 05/09/2014   Procedure: IMPLANTABLE CARDIOVERTER DEFIBRILLATOR GENERATOR CHANGE;  Surgeon: Sanda Klein, MD;  Location[facility] CATH  LAB;  Service: Cardiovascular;  Laterality: N/A;  . NM MYOCAR PERF WALL MOTION  11/21/2011   no significant ischemia    Current Medications: Outpatient Medications Prior to Visit  Medication Sig Dispense Refill  . aspirin EC 81 MG tablet Take 81 mg by mouth daily.    . carvedilol (COREG) 25 MG tablet Take 1 tablet (25 mg total) by mouth 2 (two)  times daily with a meal. 180 tablet 5  . digoxin (LANOXIN) 0.125 MG tablet Take 0.0625 mg by mouth daily.     Marland Kitchen donepezil (ARICEPT) 5 MG tablet Take 5 mg by mouth at bedtime.    Marland Kitchen ezetimibe (ZETIA) 10 MG tablet Take 10 mg by mouth at bedtime.     . furosemide (LASIX) 20 MG tablet Take 20 mg by mouth daily.     . niacin (NIASPAN) 500 MG CR tablet Take 500 mg by mouth at bedtime.   5  . nitroGLYCERIN (NITROSTAT) 0.4 MG SL tablet Place 0.4 mg under the tongue every 5 (five) minutes as needed for chest pain.    Marland Kitchen omeprazole (PRILOSEC) 20 MG capsule Take 20 mg by mouth 2 (two) times daily.     . ramipril (ALTACE) 10 MG capsule Take 10 mg by mouth daily.    . simvastatin (ZOCOR) 80 MG tablet Take 80 mg by mouth at bedtime.    . sucralfate (CARAFATE) 1 G tablet Take 1 g by mouth 4 (four) times daily -  with meals and at bedtime.      No facility-administered medications prior to visit.      Allergies:   Patient has no known allergies.   Social History   Social History  . Marital status: Married    Spouse name: N/A  . Number of children: N/A  . Years of education: N/A   Social History Main Topics  . Smoking status: Former Smoker    Quit date: 02/10/1995  . Smokeless tobacco: None  . Alcohol use No  . Drug use: No  . Sexual activity: Not Asked   Other Topics Concern  . None   Social History Narrative  . None      ROS:   Please see the history of present illness.    ROS All other systems reviewed and are negative.   PHYSICAL EXAM:   VS:  BP 113/67 (BP Location: Left Arm, Patient Position: Sitting, Cuff Size: Normal)   Pulse 72   Ht 5\' 7"  (1.702 m)   Wt 186 lb 9.6 oz (84.6 kg)   BMI 29.23 kg/m    GEN: Well nourished, well developed, in no acute distress  HEENT: normal  Neck: no JVD, carotid bruits, or masses Cardiac: RRR; no murmurs, rubs, or gallops,no edema ; healthy defibrillator site Respiratory:  clear to auscultation bilaterally, normal work of breathing GI: soft,  nontender, nondistended, + BS MS: no deformity or atrophy  Skin: warm and dry, no rash Neuro:  Alert and Oriented x 3, Strength and sensation are intact Psych: euthymic mood, full affect  Wt Readings from Last 3 Encounters:  01/01/16 186 lb 9.6 oz (84.6 kg)  07/02/15 190 lb 9.6 oz (86.5 kg)  06/22/14 197 lb 9.6 oz (89.6 kg)      Studies/Labs Reviewed:   EKG:  EKG is ordered today.  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 The QTC is 494 ms  Lipid profile 09/04/2015 at [place] is 66, cholesterol 135, HDL 44, LDL 78  Labs 09/04/2015 at [facility] Creatinine 0.9, potassium 4.5, normal LFTs  ASSESSMENT:    1. Chronic systolic CHF (congestive heart failure) ([place])   2. Coronary artery disease involving coronary bypass graft of native heart without angina pectoris   3. Pure hypercholesterolemia   4. Type 2 diabetes mellitus with other circulatory complication, without long-term current use of insulin ([place])   5. NSVT (nonsustained ventricular tachycardia) (HCC)   6. ICD (implantable cardioverter-defibrillator) in place      PLAN:  In order of problems listed above:  1. CHF: NYHA class 2, clinically euvolemic on a very low dose of loop diuretic. His device suggests that he is building up a little fluid and I advised a lot of discipline with salt intake during the upcoming holidays. I don't think he needs more diuretics at this time. He is on appropriate treatment with full dose ACE inhibitor and carvedilol. 2. CAD s/p CABG: No symptoms of angina pectoris.  3. HLP: Excellent lipid profile. Doubtful whether the long acting niacin is of benefit. We'll discontinue  4. DM: Reports good control  5. NSVT:  He has a pattern of infrequent brief episodes of nonsustained VT that have always been asymptomatic. The longer episodes of tachycardia recorded by his device with rates of around 150 bpm look like sinus tachycardia with gradual onset and resolution. 6. ICD: Normal device function. Remote download in 3 months and office visit in 6 months    Medication Adjustments/Labs and Tests Ordered: Current medicines are reviewed at length with the patient today.  Concerns regarding medicines are outlined above.  Medication changes, Labs and Tests ordered today are listed in the Patient Instructions below. Patient Instructions  Dr Sallyanne Kuster recommends that you continue on your current medications as directed. Please refer to the Current Medication list given to you today.  Remote monitoring is used to monitor your Pacemaker of ICD from home. This monitoring reduces the number of office visits required to check your device to one time per year. It allows [place] to keep an eye on the functioning of your device to ensure it is working properly. You are scheduled for a device check from home on Tuesday, February 20th, 2018. You may send your transmission at any time that day. If you have a wireless device, the transmission will be sent automatically. After your physician reviews your transmission, you will receive a postcard with your next transmission date.  Dr Sallyanne Kuster recommends that you schedule a follow-up appointment in 12 months with a defibrillator check. You will receive a reminder letter in the mail two months in advance. If you don't receive a letter, please call our office to schedule the follow-up appointment.  If you need a refill on your cardiac medications before your next appointment, please call your pharmacy.    Signed, Sanda Klein, MD  01/01/2016 5:25 PM    [facility] [place], [place], [place]  [address] Phone: [phone]; Fax: [phone]

## 2016-01-01 NOTE — Patient Instructions (Addendum)
Dr Sallyanne Kuster recommends that you continue on your current medications as directed. Please refer to the Current Medication list given to you today.  Remote monitoring is used to monitor your Pacemaker of ICD from home. This monitoring reduces the number of office visits required to check your device to one time per year. It allows [place] to keep an eye on the functioning of your device to ensure it is working properly. You are scheduled for a device check from home on Tuesday, February 20th, 2018. You may send your transmission at any time that day. If you have a wireless device, the transmission will be sent automatically. After your physician reviews your transmission, you will receive a postcard with your next transmission date.  Dr Sallyanne Kuster recommends that you schedule a follow-up appointment in 12 months with a defibrillator check. You will receive a reminder letter in the mail two months in advance. If you don't receive a letter, please call our office to schedule the follow-up appointment.  If you need a refill on your cardiac medications before your next appointment, please call your pharmacy.

## 2016-01-02 ENCOUNTER — Telehealth: Payer: Self-pay

## 2016-01-02 NOTE — Telephone Encounter (Signed)
-----   Message from Sanda Klein, MD sent at 01/01/2016  5:27 PM EST ----- Please tell him that he can stop taking niacin. His lipids look great and will still be good even without it.

## 2016-01-02 NOTE — Telephone Encounter (Signed)
Patient notified

## 2016-03-30 ENCOUNTER — Other Ambulatory Visit: Payer: Self-pay | Admitting: Cardiovascular Disease

## 2016-04-01 ENCOUNTER — Ambulatory Visit (INDEPENDENT_AMBULATORY_CARE_PROVIDER_SITE_OTHER): Payer: Medicare Other | Admitting: *Deleted

## 2016-04-01 DIAGNOSIS — I5022 Chronic systolic (congestive) heart failure: Secondary | ICD-10-CM

## 2016-04-01 DIAGNOSIS — I255 Ischemic cardiomyopathy: Secondary | ICD-10-CM

## 2016-04-01 NOTE — Progress Notes (Signed)
Remote ICD transmission.   

## 2016-04-02 ENCOUNTER — Encounter: Payer: Self-pay | Admitting: Cardiology

## 2016-04-02 LAB — CUP PACEART REMOTE DEVICE CHECK
Battery Remaining Longevity: 126 mo
Battery Voltage: 3.02 V
Brady Statistic RV Percent Paced: 0.01 %
HIGH POWER IMPEDANCE MEASURED VALUE: 70 Ohm
Lead Channel Impedance Value: 418 Ohm
Lead Channel Impedance Value: 513 Ohm
Lead Channel Sensing Intrinsic Amplitude: 3.125 mV
Lead Channel Setting Pacing Amplitude: 2 V
Lead Channel Setting Sensing Sensitivity: 0.3 mV
MDC IDC LEAD IMPLANT DT: 20160329
MDC IDC LEAD LOCATION: 753860
MDC IDC MSMT LEADCHNL RV PACING THRESHOLD AMPLITUDE: 0.5 V
MDC IDC MSMT LEADCHNL RV PACING THRESHOLD PULSEWIDTH: 0.4 ms
MDC IDC MSMT LEADCHNL RV SENSING INTR AMPL: 3.125 mV
MDC IDC PG IMPLANT DT: 20160329
MDC IDC SESS DTM: 20180220072824
MDC IDC SET LEADCHNL RV PACING PULSEWIDTH: 0.4 ms

## 2016-07-01 ENCOUNTER — Ambulatory Visit (INDEPENDENT_AMBULATORY_CARE_PROVIDER_SITE_OTHER): Payer: Medicare Other | Admitting: *Deleted

## 2016-07-01 DIAGNOSIS — I255 Ischemic cardiomyopathy: Secondary | ICD-10-CM

## 2016-07-01 DIAGNOSIS — I5022 Chronic systolic (congestive) heart failure: Secondary | ICD-10-CM

## 2016-07-01 NOTE — Progress Notes (Signed)
Remote ICD transmission.   

## 2016-07-02 ENCOUNTER — Encounter: Payer: Self-pay | Admitting: Cardiology

## 2016-07-03 LAB — CUP PACEART REMOTE DEVICE CHECK
Battery Voltage: 3.01 V
Brady Statistic RV Percent Paced: 0.01 %
Date Time Interrogation Session: 20180522042204
HIGH POWER IMPEDANCE MEASURED VALUE: 69 Ohm
Lead Channel Impedance Value: 361 Ohm
Lead Channel Impedance Value: 456 Ohm
Lead Channel Sensing Intrinsic Amplitude: 2 mV
MDC IDC LEAD IMPLANT DT: 20160329
MDC IDC LEAD LOCATION: 753860
MDC IDC MSMT BATTERY REMAINING LONGEVITY: 124 mo
MDC IDC MSMT LEADCHNL RV PACING THRESHOLD AMPLITUDE: 0.75 V
MDC IDC MSMT LEADCHNL RV PACING THRESHOLD PULSEWIDTH: 0.4 ms
MDC IDC MSMT LEADCHNL RV SENSING INTR AMPL: 2 mV
MDC IDC PG IMPLANT DT: 20160329
MDC IDC SET LEADCHNL RV PACING AMPLITUDE: 2 V
MDC IDC SET LEADCHNL RV PACING PULSEWIDTH: 0.4 ms
MDC IDC SET LEADCHNL RV SENSING SENSITIVITY: 0.3 mV

## 2016-09-30 ENCOUNTER — Ambulatory Visit (INDEPENDENT_AMBULATORY_CARE_PROVIDER_SITE_OTHER): Payer: Medicare Other | Admitting: *Deleted

## 2016-09-30 DIAGNOSIS — I255 Ischemic cardiomyopathy: Secondary | ICD-10-CM

## 2016-09-30 DIAGNOSIS — I5022 Chronic systolic (congestive) heart failure: Secondary | ICD-10-CM

## 2016-10-01 LAB — CUP PACEART REMOTE DEVICE CHECK
Battery Remaining Longevity: 122 mo
HighPow Impedance: 68 Ohm
Implantable Lead Implant Date: 20160329
Implantable Lead Location: 753860
Lead Channel Impedance Value: 399 Ohm
Lead Channel Impedance Value: 513 Ohm
Lead Channel Pacing Threshold Amplitude: 0.625 V
Lead Channel Sensing Intrinsic Amplitude: 2.5 mV
Lead Channel Setting Pacing Pulse Width: 0.4 ms
Lead Channel Setting Sensing Sensitivity: 0.3 mV
MDC IDC MSMT BATTERY VOLTAGE: 3.01 V
MDC IDC MSMT LEADCHNL RV PACING THRESHOLD PULSEWIDTH: 0.4 ms
MDC IDC MSMT LEADCHNL RV SENSING INTR AMPL: 2.5 mV
MDC IDC PG IMPLANT DT: 20160329
MDC IDC SESS DTM: 20180821052403
MDC IDC SET LEADCHNL RV PACING AMPLITUDE: 2 V
MDC IDC STAT BRADY RV PERCENT PACED: 0.09 %

## 2016-10-01 NOTE — Progress Notes (Signed)
Remote ICD transmission.   

## 2016-10-03 ENCOUNTER — Telehealth: Payer: Self-pay | Admitting: Cardiovascular Disease

## 2016-10-03 NOTE — Telephone Encounter (Signed)
Transmission received. Pt made aware. 

## 2016-10-03 NOTE — Telephone Encounter (Signed)
Ronnie Nelson is calling to see if you have received his transmission . Please call

## 2016-10-10 ENCOUNTER — Encounter: Payer: Self-pay | Admitting: Cardiology

## 2016-10-10 NOTE — Progress Notes (Signed)
Letter  

## 2016-12-30 ENCOUNTER — Ambulatory Visit (INDEPENDENT_AMBULATORY_CARE_PROVIDER_SITE_OTHER): Payer: Medicare Other | Admitting: *Deleted

## 2016-12-30 ENCOUNTER — Telehealth: Payer: Self-pay | Admitting: Cardiology

## 2016-12-30 DIAGNOSIS — I255 Ischemic cardiomyopathy: Secondary | ICD-10-CM | POA: Diagnosis not present

## 2016-12-30 DIAGNOSIS — I5022 Chronic systolic (congestive) heart failure: Secondary | ICD-10-CM

## 2016-12-30 NOTE — Telephone Encounter (Signed)
Spoke with pt and reminded pt of remote transmission that is due today. Pt verbalized understanding.   

## 2016-12-31 NOTE — Progress Notes (Signed)
Remote ICD transmission.   

## 2017-01-08 ENCOUNTER — Encounter: Payer: Self-pay | Admitting: Cardiology

## 2017-01-14 ENCOUNTER — Ambulatory Visit (INDEPENDENT_AMBULATORY_CARE_PROVIDER_SITE_OTHER): Payer: Medicare Other | Admitting: Cardiovascular Disease

## 2017-01-14 ENCOUNTER — Encounter: Payer: Self-pay | Admitting: Cardiovascular Disease

## 2017-01-14 VITALS — BP 129/77 | HR 87 | Ht 67.0 in | Wt 182.0 lb

## 2017-01-14 DIAGNOSIS — F4321 Adjustment disorder with depressed mood: Secondary | ICD-10-CM

## 2017-01-14 DIAGNOSIS — E7800 Pure hypercholesterolemia, unspecified: Secondary | ICD-10-CM

## 2017-01-14 DIAGNOSIS — I25810 Atherosclerosis of coronary artery bypass graft(s) without angina pectoris: Secondary | ICD-10-CM | POA: Diagnosis not present

## 2017-01-14 DIAGNOSIS — I255 Ischemic cardiomyopathy: Secondary | ICD-10-CM | POA: Diagnosis not present

## 2017-01-14 DIAGNOSIS — I5022 Chronic systolic (congestive) heart failure: Secondary | ICD-10-CM | POA: Diagnosis not present

## 2017-01-14 DIAGNOSIS — Z95810 Presence of automatic (implantable) cardiac defibrillator: Secondary | ICD-10-CM | POA: Diagnosis not present

## 2017-01-14 DIAGNOSIS — E1159 Type 2 diabetes mellitus with other circulatory complications: Secondary | ICD-10-CM

## 2017-01-14 DIAGNOSIS — I4729 Other ventricular tachycardia: Secondary | ICD-10-CM

## 2017-01-14 DIAGNOSIS — Z79899 Other long term (current) drug therapy: Secondary | ICD-10-CM | POA: Diagnosis not present

## 2017-01-14 DIAGNOSIS — I472 Ventricular tachycardia: Secondary | ICD-10-CM

## 2017-01-14 NOTE — Patient Instructions (Signed)
Dr Sallyanne Kuster recommends that you continue on your current medications as directed. Please refer to the Current Medication list given to you today.  Your physician recommends that you return for lab work TODAY.  Remote monitoring is used to monitor your Pacemaker or ICD from home. This monitoring reduces the number of office visits required to check your device to one time per year. It allows [place] to keep an eye on the functioning of your device to ensure it is working properly. You are scheduled for a device check from home on Tuesday, February 19th, 2019. You may send your transmission at any time that day. If you have a wireless device, the transmission will be sent automatically. After your physician reviews your transmission, you will receive a notification with your next transmission date.  Dr Sallyanne Kuster recommends that you schedule a follow-up appointment in 12 months with an ICD check. You will receive a reminder letter in the mail two months in advance. If you don't receive a letter, please call our office to schedule the follow-up appointment.  If you need a refill on your cardiac medications before your next appointment, please call your pharmacy.

## 2017-01-14 NOTE — Progress Notes (Signed)
Patient ID: Ronnie Nelson, male   DOB: 12/04/1945, 71 y.o.   MRN: [id number]    Cardiology Office Note    Date:  01/16/2017   ID:  Ronnie Nelson, DOB [birth year], MRN [id number]  PCP:  Coy Saunas, MD  Cardiologist:   Sanda Klein, MD   Chief complaint:  defibrillator check   History of Present Illness:  Ronnie Nelson is a 71 y.o. male who presents for follow-up for coronary artery disease with severe ischemic cardiomyopathy, s/p CABG, s/p ICD  The patient specifically denies any chest pain at rest exertion, dyspnea at rest or with exertion, orthopnea, paroxysmal nocturnal dyspnea, syncope, palpitations, focal neurological deficits, intermittent claudication, lower extremity edema, unexplained weight gain, cough, hemoptysis or wheezing.  He is very quiet today and he looks like he has lost some weight.  Activity level on his defibrillator has drastically decreased in 11/24/22.  He eventually told me that his wife died in 2022/11/24 after being on dialysis for 4 years.  He then started crying quietly.  He is living with his grandson.  Defibrillator interrogation otherwise shows normal device function. The current generator (Medtronic [place], change out performed March 2016) has about 10years of longevity.  He never requires ventricular pacing.  Increased frequency of PVCs is seen.  He has had a handful of episodes of very brief nonsustained VT.  There also couple of episodes of supraventricular tachycardia lasting for 2-3 minutes (possibly sinus tachycardia).  The device is discriminating well between the 2 different EGM morphologies.. Lead parameters are excellent.  His thoracic impedance was out of range throughout most of November but has returned to normal.  It never has seemed to correlate well with his symptoms.  As mentioned, activity level is drastically reduced from an average of 4 hours a day down to about an hour a day.  He has a long-standing history of coronary artery disease and had  bypass surgery in 1999. Has not required coronary angiography since the year 2000 when all his grafts were found to be patent. He does not have angina pectoris and a nuclear stress test performed roughly one year ago did not show reversible ischemia, but did show very extensive scar in the distribution of all 3 major coronary arteries.  Left ventricular systolic function is at least mildly depressed. EF was estimated to be 40-45% on an echo performed 2013, but his last nuclear stress test showed an ejection fraction of 26%. He requires a very low dose of loop diuretic.  His first defibrillator that was implanted in 2009 and he had a generator change in January 2016. Therapy has not been delivered since 2011 when he received antitachycardia pacing. I don't think he has ever had a shock. The device does report frequent episodes of nonsustained ventricular tachycardia and occasional episodes of brief sustained "VT" of up to 25 seconds in duration, usually around 150 beats per minute, probably all sinus tachycardia. These have not been symptomatic.  He has very well compensated diabetes mellitus, hyperlipidemia and hypertension and has had lab tests consistent with subclinical hyperthyroidism.  Past Medical History:  Diagnosis Date  . CAD (coronary artery disease)    a. CABG 1996: SVG to LAD,LIMA to CX,SVG to RCA  . DM (diabetes mellitus) ([place])   . HTN (hypertension)   . Hyperlipidemia   . ICD (implantable cardioverter-defibrillator) in place    a. Generator Medtronic Evera MRI [place] VR model [id number] serial number [id number]  . Ischemic cardiomyopathy  a.s/p ICD placement    Past Surgical History:  Procedure Laterality Date  . CARDIAC CATHETERIZATION  11/14/1998   severe depressed LV systolic function EF 16-10%  . CARDIAC DEFIBRILLATOR PLACEMENT  06/08/07   Medtronic  . CORONARY ARTERY BYPASS GRAFT  1996   [facility]  . IMPLANTABLE CARDIOVERTER DEFIBRILLATOR GENERATOR CHANGE N/A 05/09/2014     Procedure: IMPLANTABLE CARDIOVERTER DEFIBRILLATOR GENERATOR CHANGE;  Surgeon: Sanda Klein, MD;  Location: [facility] CATH LAB;  Service: Cardiovascular;  Laterality: N/A;  . NM MYOCAR PERF WALL MOTION  11/21/2011   no significant ischemia    Current Medications: Outpatient Medications Prior to Visit  Medication Sig Dispense Refill  . aspirin EC 81 MG tablet Take 81 mg by mouth daily.    . carvedilol (COREG) 25 MG tablet TAKE 1 TABLET BY MOUTH TWICE A DAY WITH A MEAL 180 tablet 2  . digoxin (LANOXIN) 0.125 MG tablet Take 0.0625 mg by mouth daily.     Marland Kitchen donepezil (ARICEPT) 5 MG tablet Take 5 mg by mouth at bedtime.    Marland Kitchen ezetimibe (ZETIA) 10 MG tablet Take 10 mg by mouth at bedtime.     . furosemide (LASIX) 20 MG tablet Take 20 mg by mouth daily.     . niacin (NIASPAN) 500 MG CR tablet Take 500 mg by mouth at bedtime.   5  . nitroGLYCERIN (NITROSTAT) 0.4 MG SL tablet Place 0.4 mg under the tongue every 5 (five) minutes as needed for chest pain.    Marland Kitchen omeprazole (PRILOSEC) 20 MG capsule Take 20 mg by mouth 2 (two) times daily.     . ramipril (ALTACE) 10 MG capsule Take 10 mg by mouth daily.    . simvastatin (ZOCOR) 80 MG tablet Take 80 mg by mouth at bedtime.    . sucralfate (CARAFATE) 1 G tablet Take 1 g by mouth 4 (four) times daily -  with meals and at bedtime.      No facility-administered medications prior to visit.      Allergies:   Patient has no known allergies.   Social History   Socioeconomic History  . Marital status: Married    Spouse name: None  . Number of children: None  . Years of education: None  . Highest education level: None  Social Needs  . Financial resource strain: None  . Food insecurity - worry: None  . Food insecurity - inability: None  . Transportation needs - medical: None  . Transportation needs - non-medical: None  Occupational History  . None  Tobacco Use  . Smoking status: Former Smoker    Last attempt to quit: 02/10/1995    Years since quitting:  21.9  . Smokeless tobacco: Never Used  Substance and Sexual Activity  . Alcohol use: No  . Drug use: No  . Sexual activity: None  Other Topics Concern  . None  Social History Narrative  . None      ROS:   Please see the history of present illness.    ROS All other systems reviewed and are negative.   PHYSICAL EXAM:   VS:  BP 129/77   Pulse 87   Ht 5\' 7"  (1.702 m)   Wt 182 lb (82.6 kg)   BMI 28.51 kg/m     General: Alert, oriented x3, no distress, very quiet Head: no evidence of trauma, PERRL, EOMI, no exophtalmos or lid lag, no myxedema, no xanthelasma; normal ears, nose and oropharynx Neck: normal jugular venous pulsations and no  hepatojugular reflux; brisk carotid pulses without delay and no carotid bruits Chest: clear to auscultation, no signs of consolidation by percussion or palpation, normal fremitus, symmetrical and full respiratory excursions Cardiovascular: normal position and quality of the apical impulse, regular rhythm, normal first and second heart sounds, no murmurs, rubs or gallops Abdomen: no tenderness or distention, no masses by palpation, no abnormal pulsatility or arterial bruits, normal bowel sounds, no hepatosplenomegaly Extremities: no clubbing, cyanosis or edema; 2+ radial, ulnar and brachial pulses bilaterally; 2+ right femoral, posterior tibial and dorsalis pedis pulses; 2+ left femoral, posterior tibial and dorsalis pedis pulses; no subclavian or femoral bruits Neurological: grossly nonfocal Psych: Depressed mood and affect   Wt Readings from Last 3 Encounters:  01/14/17 182 lb (82.6 kg)  01/01/16 186 lb 9.6 oz (84.6 kg)  07/02/15 190 lb 9.6 oz (86.5 kg)      Studies/Labs Reviewed:   EKG:  EKG is ordered today.  Shows sinus rhythm with PVCs and one ventricular couplet, fused beats, right bundle branch block, old inferior wall myocardial infarction.  Other than the presence of the ventricular couplet it really has not changed from his ECG a year  ago.  Lipid profile 09/04/2015 at [place] is 75, cholesterol 135, HDL 44, LDL 78  Labs 09/04/2015 at [facility] Creatinine 0.9, potassium 4.5, normal LFTs  ASSESSMENT:    1. Chronic systolic CHF (congestive heart failure) ([place])   2. Coronary artery disease involving coronary bypass graft of native heart without angina pectoris   3. Pure hypercholesterolemia   4. Type 2 diabetes mellitus with other circulatory complication, without long-term current use of insulin ([place])   5. NSVT (nonsustained ventricular tachycardia) ([facility])   6. ICD (implantable cardioverter-defibrillator) in place   7. Grief   8. Medication management      PLAN:  In order of problems listed above:  1. CHF: He remains euvolemic on a very low dose of loop diuretics, NYHA functional class II.  Activity has decreased to due to grieving, not due to physical ailments.  He is on high doses of carvedilol and ACE inhibitor.  Consider transition to [facility], but he really was not in any mood to discuss this today. 2. CAD s/p CABG: Asymptomatic, no angina pectoris, 3. HLP: Last lipid profile was excellent.  We will try to retrieve his most recent results from earlier this year 4. DM: Reports good control  5. NSVT: As before he has occasional brief episodes of asymptomatic nonsustained ventricular tachycardia. 6. ICD: Normal defibrillator function, continue remote downloads in 3 months and yearly office visits. 7. Normal grief reaction: He states that recently his mood is beginning to improve and he is starting to be a little more active.  I expressed my condolences for the loss of his wife: they have been married for 24 years and it is likely that he will have a long grieving period.    Medication Adjustments/Labs and Tests Ordered: Current medicines are reviewed at length with the patient today.  Concerns regarding medicines are outlined above.  Medication changes, Labs and Tests ordered today are  listed in the Patient Instructions below. Patient Instructions  Dr Sallyanne Kuster recommends that you continue on your current medications as directed. Please refer to the Current Medication list given to you today.  Your physician recommends that you return for lab work TODAY.  Remote monitoring is used to monitor your Pacemaker or ICD from home. This monitoring reduces the number of office visits required to check  your device to one time per year. It allows [place] to keep an eye on the functioning of your device to ensure it is working properly. You are scheduled for a device check from home on Tuesday, February 19th, 2019. You may send your transmission at any time that day. If you have a wireless device, the transmission will be sent automatically. After your physician reviews your transmission, you will receive a notification with your next transmission date.  Dr Sallyanne Kuster recommends that you schedule a follow-up appointment in 12 months with an ICD check. You will receive a reminder letter in the mail two months in advance. If you don't receive a letter, please call our office to schedule the follow-up appointment.  If you need a refill on your cardiac medications before your next appointment, please call your pharmacy.    Signed, Sanda Klein, MD  01/16/2017 5:53 PM    [address], [place], [place]  [address] Phone: [phone]; Fax: [phone]

## 2017-01-15 LAB — BASIC METABOLIC PANEL
BUN/Creatinine Ratio: 16 (ref 10–24)
BUN: 14 mg/dL (ref 8–27)
CO2: 26 mmol/L (ref 20–29)
CREATININE: 0.9 mg/dL (ref 0.76–1.27)
Calcium: 9.3 mg/dL (ref 8.6–10.2)
Chloride: 107 mmol/L — ABNORMAL HIGH (ref 96–106)
GFR, EST AFRICAN AMERICAN: 100 mL/min/{1.73_m2} (ref >59)
GFR, EST NON AFRICAN AMERICAN: 87 mL/min/{1.73_m2} (ref >59)
Glucose: 83 mg/dL (ref 65–99)
POTASSIUM: 3.8 mmol/L (ref 3.5–5.2)
SODIUM: 147 mmol/L — AB (ref 134–144)

## 2017-01-15 LAB — DIGOXIN LEVEL

## 2017-01-15 LAB — MAGNESIUM: MAGNESIUM: 1.9 mg/dL (ref 1.6–2.3)

## 2017-01-16 ENCOUNTER — Other Ambulatory Visit: Payer: Self-pay | Admitting: Cardiovascular Disease

## 2017-01-16 ENCOUNTER — Encounter: Payer: Self-pay | Admitting: Cardiovascular Disease

## 2017-01-22 LAB — CUP PACEART REMOTE DEVICE CHECK
Battery Voltage: 3 V
Date Time Interrogation Session: 20181120192459
HighPow Impedance: 57 Ohm
Implantable Lead Location: 753860
Implantable Pulse Generator Implant Date: 20160329
Lead Channel Impedance Value: 342 Ohm
Lead Channel Impedance Value: 418 Ohm
Lead Channel Sensing Intrinsic Amplitude: 2.25 mV
Lead Channel Sensing Intrinsic Amplitude: 2.25 mV
MDC IDC LEAD IMPLANT DT: 20160329
MDC IDC MSMT BATTERY REMAINING LONGEVITY: 120 mo
MDC IDC MSMT LEADCHNL RV PACING THRESHOLD AMPLITUDE: 0.5 V
MDC IDC MSMT LEADCHNL RV PACING THRESHOLD PULSEWIDTH: 0.4 ms
MDC IDC SET LEADCHNL RV PACING AMPLITUDE: 2 V
MDC IDC SET LEADCHNL RV PACING PULSEWIDTH: 0.4 ms
MDC IDC SET LEADCHNL RV SENSING SENSITIVITY: 0.3 mV
MDC IDC STAT BRADY RV PERCENT PACED: 0.05 %

## 2017-01-24 LAB — CUP PACEART INCLINIC DEVICE CHECK
Battery Remaining Longevity: 119 mo
Brady Statistic RV Percent Paced: 0.04 %
HIGH POWER IMPEDANCE MEASURED VALUE: 59 Ohm
Lead Channel Impedance Value: 399 Ohm
Lead Channel Impedance Value: 475 Ohm
Lead Channel Sensing Intrinsic Amplitude: 2.125 mV
Lead Channel Setting Sensing Sensitivity: 0.3 mV
MDC IDC LEAD IMPLANT DT: 20160329
MDC IDC LEAD LOCATION: 753860
MDC IDC MSMT BATTERY VOLTAGE: 3.01 V
MDC IDC MSMT LEADCHNL RV PACING THRESHOLD AMPLITUDE: 0.625 V
MDC IDC MSMT LEADCHNL RV PACING THRESHOLD PULSEWIDTH: 0.4 ms
MDC IDC MSMT LEADCHNL RV SENSING INTR AMPL: 2.375 mV
MDC IDC PG IMPLANT DT: 20160329
MDC IDC SESS DTM: 20181205174025
MDC IDC SET LEADCHNL RV PACING AMPLITUDE: 2 V
MDC IDC SET LEADCHNL RV PACING PULSEWIDTH: 0.4 ms

## 2017-03-23 ENCOUNTER — Telehealth: Payer: Self-pay | Admitting: Cardiovascular Disease

## 2017-03-23 NOTE — Telephone Encounter (Signed)
New message    Patient calling asking the nurse to call him did not go into details.

## 2017-03-23 NOTE — Telephone Encounter (Signed)
Left message to call back  

## 2017-03-24 NOTE — Telephone Encounter (Addendum)
Spoke with pt, he reports he has a hernia and needs a colonoscopy. He is not sure what is needed but he is going to have his sister vanessa call us.

## 2017-03-24 NOTE — Telephone Encounter (Signed)
Spoke with pt sister, vanessa, aware if he is needing clearance for procedure or surgery, they will send [place] a letter for clearance.

## 2017-03-31 ENCOUNTER — Telehealth: Payer: Self-pay | Admitting: Cardiovascular Disease

## 2017-03-31 ENCOUNTER — Ambulatory Visit (INDEPENDENT_AMBULATORY_CARE_PROVIDER_SITE_OTHER): Payer: Medicare Other | Admitting: *Deleted

## 2017-03-31 DIAGNOSIS — I255 Ischemic cardiomyopathy: Secondary | ICD-10-CM

## 2017-03-31 DIAGNOSIS — I5022 Chronic systolic (congestive) heart failure: Secondary | ICD-10-CM

## 2017-03-31 LAB — CUP PACEART REMOTE DEVICE CHECK
Brady Statistic RV Percent Paced: 0.04 %
Date Time Interrogation Session: 20190219072823
HIGH POWER IMPEDANCE MEASURED VALUE: 62 Ohm
Implantable Pulse Generator Implant Date: 20160329
Lead Channel Impedance Value: 361 Ohm
Lead Channel Impedance Value: 456 Ohm
Lead Channel Pacing Threshold Pulse Width: 0.4 ms
Lead Channel Sensing Intrinsic Amplitude: 2.25 mV
Lead Channel Sensing Intrinsic Amplitude: 2.25 mV
Lead Channel Setting Pacing Pulse Width: 0.4 ms
MDC IDC LEAD IMPLANT DT: 20160329
MDC IDC LEAD LOCATION: 753860
MDC IDC MSMT BATTERY REMAINING LONGEVITY: 115 mo
MDC IDC MSMT BATTERY VOLTAGE: 3.01 V
MDC IDC MSMT LEADCHNL RV PACING THRESHOLD AMPLITUDE: 0.625 V
MDC IDC SET LEADCHNL RV PACING AMPLITUDE: 2 V
MDC IDC SET LEADCHNL RV SENSING SENSITIVITY: 0.3 mV

## 2017-03-31 NOTE — Telephone Encounter (Signed)
New message    1. Has your device fired? no  2. Is you device beeping? no  3. Are you experiencing draining or swelling at device site? no  4. Are you calling to see if we received your device transmission? yes  5. Have you passed out? no    Please route to [place]

## 2017-03-31 NOTE — Telephone Encounter (Signed)
Confirmed that we successfully received remote transmission.

## 2017-03-31 NOTE — Progress Notes (Signed)
Remote ICD transmission.   

## 2017-04-02 ENCOUNTER — Encounter: Payer: Self-pay | Admitting: Cardiology

## 2017-04-20 ENCOUNTER — Telehealth: Payer: Self-pay | Admitting: *Deleted

## 2017-04-20 NOTE — Telephone Encounter (Signed)
LMOM to return call to [facility] (to assess medication compliance and HF symptoms).

## 2017-05-05 NOTE — Progress Notes (Deleted)
CARDIOLOGY OFFICE NOTE  Date:  05/06/2017    Ronnie Nelson Date of Birth: May 15, 1945 Medical Record #[id number]  PCP:  Coy Saunas, MD  Cardiologist:  [place]   No chief complaint on file.   History of Present Illness: Ronnie Nelson is a 72 y.o. male who presents today for a work in visit. Seen for Dr. Sallyanne Kuster.   He has a history of known CAD with ischemic CM, prior CABG in 1999, and underlying ICD.  Last cath in 2000 - grafts patent. Prior EF 40 to 45% on echo in 2013, but prior Myoview EF of 26% - did not show reversible ischemia, but did show very extensive scar in the distribution of all 3 major coronary arteries.  Other issues include DM, HLD, HTN and subclinical hyperthyroidism.   Last seen in 2023-02-27 - wife had died back in 11-28-22 - lots of grief. Living with his grandson. Noted increased frequency of PVCs on his device check. Noted that his thoracic impedances have not correlated well with his symptoms. Activity level had drastically reduced.  Overall felt to be stable from our standpoint. Was to consider Entresto in the future.   Comes in today. Here with   Past Medical History:  Diagnosis Date  . CAD (coronary artery disease)    a. CABG 1996: SVG to LAD,LIMA to CX,SVG to RCA  . DM (diabetes mellitus) ([place])   . HTN (hypertension)   . Hyperlipidemia   . ICD (implantable cardioverter-defibrillator) in place    a. Generator Medtronic Evera MRI [place] VR model [id number] serial number [id number]  . Ischemic cardiomyopathy    a.s/p ICD placement    Past Surgical History:  Procedure Laterality Date  . CARDIAC CATHETERIZATION  11/14/1998   severe depressed LV systolic function EF 54-27%  . CARDIAC DEFIBRILLATOR PLACEMENT  06/08/07   Medtronic  . CORONARY ARTERY BYPASS GRAFT  1996   [facility]  . IMPLANTABLE CARDIOVERTER DEFIBRILLATOR GENERATOR CHANGE N/A 05/09/2014   Procedure: IMPLANTABLE CARDIOVERTER DEFIBRILLATOR GENERATOR CHANGE;  Surgeon: Sanda Klein,  MD;  Location: [facility];  Service: Cardiovascular;  Laterality: N/A;  . NM MYOCAR PERF WALL MOTION  11/21/2011   no significant ischemia     Medications: No outpatient medications have been marked as taking for the 05/06/17 encounter (Appointment) with Burtis Junes, NP.     Allergies: No Known Allergies  Social History: The patient  reports that he quit smoking about 22 years ago. He has never used smokeless tobacco. He reports that he does not drink alcohol or use drugs.   Family History: The patient's ***family history is not on file.   Review of Systems: Please see the history of present illness.   Otherwise, the review of systems is positive for none.   All other systems are reviewed and negative.   Physical Exam: VS:  There were no vitals taken for this visit. Marland Kitchen  BMI There is no height or weight on file to calculate BMI.  Wt Readings from Last 3 Encounters:  01/14/17 182 lb (82.6 kg)  01/01/16 186 lb 9.6 oz (84.6 kg)  07/02/15 190 lb 9.6 oz (86.5 kg)    General: Pleasant. Well developed, well nourished and in no acute distress.   HEENT: Normal.  Neck: Supple, no JVD, carotid bruits, or masses noted.  Cardiac: Regular rate and rhythm. No murmurs, rubs, or gallops. No edema.  Respiratory:  Lungs are clear to auscultation bilaterally with normal work of breathing.  GI: Soft and nontender.  MS: No deformity or atrophy. Gait and ROM intact.  Skin: Warm and dry. Color is normal.  Neuro:  Strength and sensation are intact and no gross focal deficits noted.  Psych: Alert, appropriate and with normal affect.   LABORATORY DATA:  EKG:  EKG is not ordered today.  Lab Results  Component Value Date   WBC 5.0 05/03/2014   HGB 14.5 05/03/2014   HCT 43.9 05/03/2014   PLT 154 05/03/2014   GLUCOSE 83 01/14/2017   ALT 18 05/03/2014   AST 20 05/03/2014   NA 147 (H) 01/14/2017   K 3.8 01/14/2017   CL 107 (H) 01/14/2017   CREATININE 0.90 01/14/2017   BUN 14 01/14/2017    CO2 26 01/14/2017   INR 1.17 05/03/2014     BNP (last 3 results) No results for input(s): BNP in the last 8760 hours.  ProBNP (last 3 results) No results for input(s): PROBNP in the last 8760 hours.   Other Studies Reviewed Today:  Echo Study Conclusions 01/2012  - Left ventricle: The cavity size was moderately dilated. Systolic function was mildly to moderately reduced. The estimated ejection fraction was in the range of 40% to 45%. Hypokinesis of the inferior myocardium. - Ventricular septum: Thickness was mildly increased. - Mitral valve: Mild regurgitation. - Left atrium: The atrium was mildly to moderately dilated. - Right ventricle: Systolic function was mildly to moderately reduced. - Atrial septum: No defect or patent foramen ovale was identified. - Tricuspid valve: Mild-moderate regurgitation. Impressions:  - Compared to study of 09/2010 LV slighly more dilated. No significant change in EF.  Assessment/Plan:  1. Acute on chronic systolic HF  2. CAD - remote CABG  3. Ischemic CM  4. HLD  5. DM  6. Underlying ICD  7. Grief reaction      PLAN:  In order of problems listed above:  1. CHF: He remains euvolemic on a very low dose of loop diuretics, NYHA functional class II.  Activity has decreased to due to grieving, not due to physical ailments.  He is on high doses of carvedilol and ACE inhibitor.  Consider transition to [facility], but he really was not in any mood to discuss this today. 2. CAD s/p CABG: Asymptomatic, no angina pectoris, 3. HLP: Last lipid profile was excellent.  We will try to retrieve his most recent results from earlier this year 4. DM: Reports good control  5. NSVT: As before he has occasional brief episodes of asymptomatic nonsustained ventricular tachycardia. 6. ICD: Normal defibrillator function, continue remote downloads in 3 months and yearly office visits. 7. Normal grief reaction: He states that recently  his mood is beginning to improve and he is starting to be a little more active.  I expressed my condolences for the loss of his wife: they have been married for 60 years and it is likely that he will have a long grieving period.     Current medicines are reviewed with the patient today.  The patient does not have concerns regarding medicines other than what has been noted above.  The following changes have been made:  See above.  Labs/ tests ordered today include:   No orders of the defined types were placed in this encounter.    Disposition:   FU with *** in {gen number 3-53:[id number]} {Days to years:10300}.   Patient is agreeable to this plan and will call if any problems develop in the interim.   Signed: Truitt Merle, NP  05/06/2017 8:58 AM  [facility] [address] [place], [place]  [address] Phone: [phone] Fax: [phone]

## 2017-05-06 ENCOUNTER — Ambulatory Visit: Payer: Medicare Other | Admitting: Nurse Practitioner

## 2017-05-06 DIAGNOSIS — R0989 Other specified symptoms and signs involving the circulatory and respiratory systems: Secondary | ICD-10-CM

## 2017-05-07 ENCOUNTER — Encounter: Payer: Self-pay | Admitting: Nurse Practitioner

## 2017-06-17 ENCOUNTER — Telehealth: Payer: Self-pay

## 2017-06-17 NOTE — Telephone Encounter (Signed)
   [facility] Pre-operative Risk Assessment    Request for surgical clearance:  1. What type of surgery is being performed? Intracranial removal of a vestibular Schwannoma  2. When is this surgery scheduled? TBD   3. What type of clearance is required (medical clearance vs. Pharmacy clearance to hold med vs. Both)? Both  4. Are there any medications that need to be held prior to surgery and how long?ASA   5. Practice name and name of physician performing surgery? [facility]  6. What is your office phone number  [phone]   7.   What is your office fax number [phone]  8.   Anesthesia type (None, local, MAC, general) ? General   Meryl Crutch 06/17/2017, 8:04 AM  _________________________________________________________________   (provider comments below)

## 2017-06-18 NOTE — Telephone Encounter (Signed)
Pt has an appointment on 5/13 with Dr. Loletha Grayer for pre-op clearance. Please inform the requesting provider.

## 2017-06-19 ENCOUNTER — Emergency Department (HOSPITAL_COMMUNITY): Payer: Medicare Other

## 2017-06-19 ENCOUNTER — Inpatient Hospital Stay (HOSPITAL_COMMUNITY)
Admission: EM | Admit: 2017-06-19 | Discharge: 2017-06-24 | DRG: 871 | Disposition: A | Discharge status: Home Health | Payer: Medicare Other | Attending: Internal Medicine | Admitting: Internal Medicine

## 2017-06-19 ENCOUNTER — Other Ambulatory Visit: Payer: Self-pay

## 2017-06-19 DIAGNOSIS — I110 Hypertensive heart disease with heart failure: Secondary | ICD-10-CM | POA: Diagnosis present

## 2017-06-19 DIAGNOSIS — R5381 Other malaise: Secondary | ICD-10-CM | POA: Diagnosis present

## 2017-06-19 DIAGNOSIS — I2510 Atherosclerotic heart disease of native coronary artery without angina pectoris: Secondary | ICD-10-CM | POA: Diagnosis present

## 2017-06-19 DIAGNOSIS — T82190A Other mechanical complication of cardiac electrode, initial encounter: Secondary | ICD-10-CM | POA: Diagnosis present

## 2017-06-19 DIAGNOSIS — Z79899 Other long term (current) drug therapy: Secondary | ICD-10-CM

## 2017-06-19 DIAGNOSIS — Y712 Prosthetic and other implants, materials and accessory cardiovascular devices associated with adverse incidents: Secondary | ICD-10-CM | POA: Diagnosis present

## 2017-06-19 DIAGNOSIS — T82110A Breakdown (mechanical) of cardiac electrode, initial encounter: Secondary | ICD-10-CM | POA: Diagnosis present

## 2017-06-19 DIAGNOSIS — N179 Acute kidney failure, unspecified: Secondary | ICD-10-CM | POA: Diagnosis present

## 2017-06-19 DIAGNOSIS — Z951 Presence of aortocoronary bypass graft: Secondary | ICD-10-CM

## 2017-06-19 DIAGNOSIS — R17 Unspecified jaundice: Secondary | ICD-10-CM | POA: Diagnosis present

## 2017-06-19 DIAGNOSIS — I10 Essential (primary) hypertension: Secondary | ICD-10-CM | POA: Diagnosis present

## 2017-06-19 DIAGNOSIS — I69354 Hemiplegia and hemiparesis following cerebral infarction affecting left non-dominant side: Secondary | ICD-10-CM

## 2017-06-19 DIAGNOSIS — E785 Hyperlipidemia, unspecified: Secondary | ICD-10-CM | POA: Diagnosis present

## 2017-06-19 DIAGNOSIS — I255 Ischemic cardiomyopathy: Secondary | ICD-10-CM | POA: Diagnosis present

## 2017-06-19 DIAGNOSIS — E871 Hypo-osmolality and hyponatremia: Secondary | ICD-10-CM | POA: Diagnosis present

## 2017-06-19 DIAGNOSIS — D3610 Benign neoplasm of peripheral nerves and autonomic nervous system, unspecified: Secondary | ICD-10-CM | POA: Diagnosis present

## 2017-06-19 DIAGNOSIS — A419 Sepsis, unspecified organism: Secondary | ICD-10-CM | POA: Diagnosis not present

## 2017-06-19 DIAGNOSIS — Z95810 Presence of automatic (implantable) cardiac defibrillator: Secondary | ICD-10-CM

## 2017-06-19 DIAGNOSIS — E876 Hypokalemia: Secondary | ICD-10-CM | POA: Diagnosis not present

## 2017-06-19 DIAGNOSIS — Z7982 Long term (current) use of aspirin: Secondary | ICD-10-CM

## 2017-06-19 DIAGNOSIS — Z87891 Personal history of nicotine dependence: Secondary | ICD-10-CM

## 2017-06-19 DIAGNOSIS — G9341 Metabolic encephalopathy: Secondary | ICD-10-CM | POA: Diagnosis present

## 2017-06-19 DIAGNOSIS — I340 Nonrheumatic mitral (valve) insufficiency: Secondary | ICD-10-CM | POA: Diagnosis present

## 2017-06-19 DIAGNOSIS — F0390 Unspecified dementia without behavioral disturbance: Secondary | ICD-10-CM | POA: Diagnosis present

## 2017-06-19 DIAGNOSIS — E861 Hypovolemia: Secondary | ICD-10-CM | POA: Diagnosis present

## 2017-06-19 DIAGNOSIS — I452 Bifascicular block: Secondary | ICD-10-CM | POA: Diagnosis present

## 2017-06-19 DIAGNOSIS — E860 Dehydration: Secondary | ICD-10-CM | POA: Diagnosis present

## 2017-06-19 DIAGNOSIS — G9340 Encephalopathy, unspecified: Secondary | ICD-10-CM | POA: Diagnosis present

## 2017-06-19 DIAGNOSIS — I5022 Chronic systolic (congestive) heart failure: Secondary | ICD-10-CM | POA: Diagnosis present

## 2017-06-19 DIAGNOSIS — E870 Hyperosmolality and hypernatremia: Secondary | ICD-10-CM | POA: Diagnosis present

## 2017-06-19 DIAGNOSIS — E440 Moderate protein-calorie malnutrition: Secondary | ICD-10-CM | POA: Diagnosis present

## 2017-06-19 DIAGNOSIS — E119 Type 2 diabetes mellitus without complications: Secondary | ICD-10-CM | POA: Diagnosis present

## 2017-06-19 DIAGNOSIS — Z6824 Body mass index (BMI) 24.0-24.9, adult: Secondary | ICD-10-CM

## 2017-06-19 DIAGNOSIS — I472 Ventricular tachycardia: Secondary | ICD-10-CM | POA: Diagnosis present

## 2017-06-19 DIAGNOSIS — K529 Noninfective gastroenteritis and colitis, unspecified: Secondary | ICD-10-CM | POA: Diagnosis present

## 2017-06-19 DIAGNOSIS — Z4502 Encounter for adjustment and management of automatic implantable cardiac defibrillator: Secondary | ICD-10-CM

## 2017-06-19 LAB — DIFFERENTIAL
Basophils Absolute: 0 10*3/uL (ref 0.0–0.1)
Basophils Relative: 0 %
EOS ABS: 0 10*3/uL (ref 0.0–0.7)
EOS PCT: 0 %
LYMPHS ABS: 2 10*3/uL (ref 0.7–4.0)
Lymphocytes Relative: 24 %
MONOS PCT: 11 %
Monocytes Absolute: 0.9 10*3/uL (ref 0.1–1.0)
Neutro Abs: 5.4 10*3/uL (ref 1.7–7.7)
Neutrophils Relative %: 65 %

## 2017-06-19 LAB — CBC
HEMATOCRIT: 56 % — AB (ref 39.0–52.0)
HEMOGLOBIN: 18.7 g/dL — AB (ref 13.0–17.0)
MCH: 31 pg (ref 26.0–34.0)
MCHC: 33.4 g/dL (ref 30.0–36.0)
MCV: 92.9 fL (ref 78.0–100.0)
Platelets: 146 10*3/uL — ABNORMAL LOW (ref 150–400)
RBC: 6.03 MIL/uL — ABNORMAL HIGH (ref 4.22–5.81)
RDW: 14.5 % (ref 11.5–15.5)
WBC: 8.3 10*3/uL (ref 4.0–10.5)

## 2017-06-19 LAB — CBG MONITORING, ED: Glucose-Capillary: 100 mg/dL — ABNORMAL HIGH (ref 65–99)

## 2017-06-19 LAB — I-STAT CG4 LACTIC ACID, ED: LACTIC ACID, VENOUS: 3.08 mmol/L — AB (ref 0.5–1.9)

## 2017-06-19 LAB — PROTIME-INR
INR: 1.18
Prothrombin Time: 14.9 seconds (ref 11.4–15.2)

## 2017-06-19 LAB — I-STAT TROPONIN, ED: TROPONIN I, POC: 0.01 ng/mL (ref 0.00–0.08)

## 2017-06-19 LAB — APTT: aPTT: 30 seconds (ref 24–36)

## 2017-06-19 MED ORDER — ACETAMINOPHEN 325 MG PO TABS
650.0000 mg | ORAL_TABLET | Freq: Once | ORAL | Status: DC
  Filled 2017-06-19: qty 2

## 2017-06-19 MED ORDER — PIPERACILLIN-TAZOBACTAM 3.375 G IVPB 30 MIN
3.3750 g | Freq: Once | INTRAVENOUS | Status: AC
  Administered 2017-06-19: 3.375 g via INTRAVENOUS
  Filled 2017-06-19: qty 50

## 2017-06-19 MED ORDER — SODIUM CHLORIDE 0.9 % IV BOLUS
1000.0000 mL | Freq: Once | INTRAVENOUS | Status: AC
  Administered 2017-06-19: 1000 mL via INTRAVENOUS

## 2017-06-19 MED ORDER — VANCOMYCIN HCL IN DEXTROSE 1-5 GM/200ML-% IV SOLN
1000.0000 mg | Freq: Once | INTRAVENOUS | Status: AC
  Administered 2017-06-20: 1000 mg via INTRAVENOUS
  Filled 2017-06-19: qty 200

## 2017-06-19 MED ORDER — SODIUM CHLORIDE 0.9 % IV BOLUS
500.0000 mL | Freq: Once | INTRAVENOUS | Status: AC
  Administered 2017-06-19: 500 mL via INTRAVENOUS

## 2017-06-19 NOTE — ED Triage Notes (Signed)
Per spouse pt from home. Hx of stroke last month.  Was ambulatory and doing well at home. 3 days ago began having altered mental status, weakness, unable to walk, not eating. Pt has unequal grips which was no residual symptom per wife. Weakness on left. Unable to hold himself up in wheelchair

## 2017-06-19 NOTE — Telephone Encounter (Signed)
Faxed to [facility]

## 2017-06-19 NOTE — ED Provider Notes (Signed)
Patient placed in Quick Look pathway, seen and evaluated   Chief Complaint: AMS  HPI:   Level 5 caveat due to AMS. History provided by sister at bedside reports change in behavior, decreased appetite, confusion x 3 days. H/o recent stroke with left sided weakness.  No head trauma.   ROS: positive for AMS, confusion, fever.  Negative for vomiting, diarrhea.   Physical Exam:   Gen: No distress  Neuro: Awake and Alert  Skin: Warm    Focused Exam: Slightly agitated. Will not speak to me but will follow instructions. Dry lips and mucous membranes, tachycardic, tachypnic, febrile. Diminished lung sounds anterior on left. Abdomen soft NTND. Weakness with left hand grip and ankle F/E. Good strength on right.   ddx infectious etiology vs CNS less likely.  Sepsis protocol initiated at triage.  H/o HF with low EF and pacemaker, will hold off fluids. Antibiotics ordered. Requested charge RN pt be placed in acute bed asap. Pt going to [facility].   Initiation of care has begun. The patient has been counseled on the process, plan, and necessity for staying for the completion/evaluation, and the remainder of the medical screening examination    Arlean Hopping 06/19/17 2315    Fatima Blank, MD 06/20/17 [phone]

## 2017-06-20 ENCOUNTER — Encounter (HOSPITAL_COMMUNITY): Payer: Self-pay | Admitting: Internal Medicine

## 2017-06-20 ENCOUNTER — Emergency Department (HOSPITAL_COMMUNITY): Payer: Medicare Other

## 2017-06-20 DIAGNOSIS — A419 Sepsis, unspecified organism: Secondary | ICD-10-CM | POA: Diagnosis present

## 2017-06-20 DIAGNOSIS — E860 Dehydration: Secondary | ICD-10-CM | POA: Diagnosis present

## 2017-06-20 DIAGNOSIS — E1159 Type 2 diabetes mellitus with other circulatory complications: Secondary | ICD-10-CM | POA: Diagnosis not present

## 2017-06-20 DIAGNOSIS — I452 Bifascicular block: Secondary | ICD-10-CM | POA: Diagnosis present

## 2017-06-20 DIAGNOSIS — T82190A Other mechanical complication of cardiac electrode, initial encounter: Secondary | ICD-10-CM | POA: Diagnosis present

## 2017-06-20 DIAGNOSIS — F0390 Unspecified dementia without behavioral disturbance: Secondary | ICD-10-CM | POA: Diagnosis present

## 2017-06-20 DIAGNOSIS — I25810 Atherosclerosis of coronary artery bypass graft(s) without angina pectoris: Secondary | ICD-10-CM | POA: Diagnosis not present

## 2017-06-20 DIAGNOSIS — I255 Ischemic cardiomyopathy: Secondary | ICD-10-CM | POA: Diagnosis not present

## 2017-06-20 DIAGNOSIS — I69354 Hemiplegia and hemiparesis following cerebral infarction affecting left non-dominant side: Secondary | ICD-10-CM | POA: Diagnosis not present

## 2017-06-20 DIAGNOSIS — I10 Essential (primary) hypertension: Secondary | ICD-10-CM | POA: Diagnosis not present

## 2017-06-20 DIAGNOSIS — E876 Hypokalemia: Secondary | ICD-10-CM | POA: Diagnosis not present

## 2017-06-20 DIAGNOSIS — I110 Hypertensive heart disease with heart failure: Secondary | ICD-10-CM | POA: Diagnosis present

## 2017-06-20 DIAGNOSIS — E870 Hyperosmolality and hypernatremia: Secondary | ICD-10-CM | POA: Diagnosis present

## 2017-06-20 DIAGNOSIS — Y712 Prosthetic and other implants, materials and accessory cardiovascular devices associated with adverse incidents: Secondary | ICD-10-CM | POA: Diagnosis present

## 2017-06-20 DIAGNOSIS — N179 Acute kidney failure, unspecified: Secondary | ICD-10-CM | POA: Diagnosis present

## 2017-06-20 DIAGNOSIS — E785 Hyperlipidemia, unspecified: Secondary | ICD-10-CM | POA: Diagnosis present

## 2017-06-20 DIAGNOSIS — I2510 Atherosclerotic heart disease of native coronary artery without angina pectoris: Secondary | ICD-10-CM | POA: Diagnosis not present

## 2017-06-20 DIAGNOSIS — E871 Hypo-osmolality and hyponatremia: Secondary | ICD-10-CM | POA: Diagnosis present

## 2017-06-20 DIAGNOSIS — G9340 Encephalopathy, unspecified: Secondary | ICD-10-CM | POA: Diagnosis not present

## 2017-06-20 DIAGNOSIS — R17 Unspecified jaundice: Secondary | ICD-10-CM | POA: Diagnosis present

## 2017-06-20 DIAGNOSIS — Z95810 Presence of automatic (implantable) cardiac defibrillator: Secondary | ICD-10-CM | POA: Diagnosis not present

## 2017-06-20 DIAGNOSIS — K529 Noninfective gastroenteritis and colitis, unspecified: Secondary | ICD-10-CM | POA: Diagnosis present

## 2017-06-20 DIAGNOSIS — E119 Type 2 diabetes mellitus without complications: Secondary | ICD-10-CM | POA: Diagnosis present

## 2017-06-20 DIAGNOSIS — G9341 Metabolic encephalopathy: Secondary | ICD-10-CM | POA: Diagnosis present

## 2017-06-20 DIAGNOSIS — I5022 Chronic systolic (congestive) heart failure: Secondary | ICD-10-CM | POA: Diagnosis present

## 2017-06-20 DIAGNOSIS — I472 Ventricular tachycardia: Secondary | ICD-10-CM | POA: Diagnosis present

## 2017-06-20 DIAGNOSIS — Z4502 Encounter for adjustment and management of automatic implantable cardiac defibrillator: Secondary | ICD-10-CM | POA: Diagnosis not present

## 2017-06-20 DIAGNOSIS — E440 Moderate protein-calorie malnutrition: Secondary | ICD-10-CM | POA: Diagnosis present

## 2017-06-20 DIAGNOSIS — Z6824 Body mass index (BMI) 24.0-24.9, adult: Secondary | ICD-10-CM | POA: Diagnosis not present

## 2017-06-20 DIAGNOSIS — E861 Hypovolemia: Secondary | ICD-10-CM | POA: Diagnosis present

## 2017-06-20 LAB — CBC WITH DIFFERENTIAL/PLATELET
BASOS ABS: 0 10*3/uL (ref 0.0–0.1)
Basophils Relative: 0 %
EOS ABS: 0.1 10*3/uL (ref 0.0–0.7)
EOS PCT: 1 %
HCT: 48.1 % (ref 39.0–52.0)
Hemoglobin: 15.5 g/dL (ref 13.0–17.0)
LYMPHS ABS: 2.1 10*3/uL (ref 0.7–4.0)
Lymphocytes Relative: 27 %
MCH: 30.1 pg (ref 26.0–34.0)
MCHC: 32.2 g/dL (ref 30.0–36.0)
MCV: 93.4 fL (ref 78.0–100.0)
Monocytes Absolute: 0.9 10*3/uL (ref 0.1–1.0)
Monocytes Relative: 12 %
Neutro Abs: 4.6 10*3/uL (ref 1.7–7.7)
Neutrophils Relative %: 60 %
PLATELETS: 116 10*3/uL — AB (ref 150–400)
RBC: 5.15 MIL/uL (ref 4.22–5.81)
RDW: 14.4 % (ref 11.5–15.5)
WBC: 7.7 10*3/uL (ref 4.0–10.5)

## 2017-06-20 LAB — COMPREHENSIVE METABOLIC PANEL
ALK PHOS: 83 U/L (ref 38–126)
ALT: 23 U/L (ref 17–63)
ALT: 18 U/L (ref 17–63)
AST: 37 U/L (ref 15–41)
AST: 28 U/L (ref 15–41)
Albumin: 4.2 g/dL (ref 3.5–5.0)
Albumin: 3.4 g/dL — ABNORMAL LOW (ref 3.5–5.0)
Alkaline Phosphatase: 67 U/L (ref 38–126)
Anion gap: 13 (ref 5–15)
Anion gap: 10 (ref 5–15)
BILIRUBIN TOTAL: 2.9 mg/dL — AB (ref 0.3–1.2)
BUN: 26 mg/dL — AB (ref 6–20)
BUN: 19 mg/dL (ref 6–20)
CALCIUM: 10 mg/dL (ref 8.9–10.3)
CHLORIDE: 116 mmol/L — AB (ref 101–111)
CO2: 25 mmol/L (ref 22–32)
CO2: 22 mmol/L (ref 22–32)
CREATININE: 0.86 mg/dL (ref 0.61–1.24)
Calcium: 8.6 mg/dL — ABNORMAL LOW (ref 8.9–10.3)
Chloride: 111 mmol/L (ref 101–111)
Creatinine, Ser: 1.22 mg/dL (ref 0.61–1.24)
GFR calc Af Amer: >60 mL/min (ref >60)
GFR calc non Af Amer: >60 mL/min (ref >60)
GFR, EST NON AFRICAN AMERICAN: 59 mL/min — AB (ref >60)
Glucose, Bld: 104 mg/dL — ABNORMAL HIGH (ref 65–99)
Glucose, Bld: 84 mg/dL (ref 65–99)
POTASSIUM: 4.3 mmol/L (ref 3.5–5.1)
Potassium: 4.1 mmol/L (ref 3.5–5.1)
SODIUM: 148 mmol/L — AB (ref 135–145)
Sodium: 149 mmol/L — ABNORMAL HIGH (ref 135–145)
TOTAL PROTEIN: 7.5 g/dL (ref 6.5–8.1)
Total Bilirubin: 2.1 mg/dL — ABNORMAL HIGH (ref 0.3–1.2)
Total Protein: 6.3 g/dL — ABNORMAL LOW (ref 6.5–8.1)

## 2017-06-20 LAB — URINALYSIS, ROUTINE W REFLEX MICROSCOPIC
GLUCOSE, UA: NEGATIVE mg/dL
Ketones, ur: 20 mg/dL — AB
Leukocytes, UA: NEGATIVE
NITRITE: NEGATIVE
PH: 5 (ref 5.0–8.0)
PROTEIN: 100 mg/dL — AB
Specific Gravity, Urine: 1.031 — ABNORMAL HIGH (ref 1.005–1.030)

## 2017-06-20 LAB — LACTIC ACID, PLASMA: Lactic Acid, Venous: 1.3 mmol/L (ref 0.5–1.9)

## 2017-06-20 LAB — I-STAT CG4 LACTIC ACID, ED: LACTIC ACID, VENOUS: 2.04 mmol/L — AB (ref 0.5–1.9)

## 2017-06-20 LAB — DIGOXIN LEVEL: Digoxin Level: <0.2 ng/mL — ABNORMAL LOW (ref 0.8–2.0)

## 2017-06-20 LAB — PROCALCITONIN

## 2017-06-20 LAB — CBG MONITORING, ED: Glucose-Capillary: 80 mg/dL (ref 65–99)

## 2017-06-20 LAB — RAPID URINE DRUG SCREEN, HOSP PERFORMED
Amphetamines: NOT DETECTED
BENZODIAZEPINES: POSITIVE — AB
Barbiturates: NOT DETECTED
COCAINE: NOT DETECTED
OPIATES: NOT DETECTED
Tetrahydrocannabinol: NOT DETECTED

## 2017-06-20 LAB — TROPONIN I: TROPONIN I: 0.04 ng/mL — AB (ref <0.03)

## 2017-06-20 MED ORDER — ONDANSETRON HCL 4 MG PO TABS: 4.0000 mg | ORAL_TABLET | Freq: Four times a day (QID) | ORAL | Status: DC | PRN

## 2017-06-20 MED ORDER — ASPIRIN EC 81 MG PO TBEC
81.0000 mg | DELAYED_RELEASE_TABLET | Freq: Every day | ORAL | Status: DC
  Administered 2017-06-24: 81 mg via ORAL
  Filled 2017-06-20 (×3): qty 1

## 2017-06-20 MED ORDER — HYDRALAZINE HCL 20 MG/ML IJ SOLN
10.0000 mg | INTRAMUSCULAR | Status: DC | PRN
  Administered 2017-06-23: 10 mg via INTRAVENOUS
  Filled 2017-06-20: qty 1

## 2017-06-20 MED ORDER — SODIUM CHLORIDE 0.45 % IV SOLN
INTRAVENOUS | Status: AC
  Administered 2017-06-20: 08:00:00 via INTRAVENOUS

## 2017-06-20 MED ORDER — ACETAMINOPHEN 650 MG RE SUPP: 650.0000 mg | Freq: Four times a day (QID) | RECTAL | Status: DC | PRN

## 2017-06-20 MED ORDER — SODIUM CHLORIDE 0.45 % IV SOLN: INTRAVENOUS | Status: DC

## 2017-06-20 MED ORDER — ONDANSETRON HCL 4 MG/2ML IJ SOLN: 4.0000 mg | Freq: Four times a day (QID) | INTRAMUSCULAR | Status: DC | PRN

## 2017-06-20 MED ORDER — PANTOPRAZOLE SODIUM 40 MG PO TBEC
40.0000 mg | DELAYED_RELEASE_TABLET | Freq: Every day | ORAL | Status: DC
  Administered 2017-06-23 – 2017-06-24 (×2): 40 mg via ORAL
  Filled 2017-06-20 (×3): qty 1

## 2017-06-20 MED ORDER — SUCRALFATE 1 G PO TABS
1.0000 g | ORAL_TABLET | Freq: Three times a day (TID) | ORAL | Status: DC
  Administered 2017-06-22 – 2017-06-24 (×9): 1 g via ORAL
  Filled 2017-06-20 (×12): qty 1

## 2017-06-20 MED ORDER — ACETAMINOPHEN 325 MG PO TABS
650.0000 mg | ORAL_TABLET | Freq: Four times a day (QID) | ORAL | Status: DC | PRN
Start: 2017-06-20 — End: 2017-06-24
  Administered 2017-06-23: 650 mg via ORAL
  Filled 2017-06-20: qty 2

## 2017-06-20 MED ORDER — IOHEXOL 300 MG/ML  SOLN
100.0000 mL | Freq: Once | INTRAMUSCULAR | Status: AC | PRN
  Administered 2017-06-20: 100 mL via INTRAVENOUS

## 2017-06-20 MED ORDER — VANCOMYCIN HCL IN DEXTROSE 750-5 MG/150ML-% IV SOLN
750.0000 mg | Freq: Two times a day (BID) | INTRAVENOUS | Status: DC
Start: 2017-06-20 — End: 2017-06-22
  Administered 2017-06-20 – 2017-06-21 (×4): 750 mg via INTRAVENOUS
  Filled 2017-06-20 (×6): qty 150

## 2017-06-20 MED ORDER — AMANTADINE HCL 100 MG PO CAPS
100.0000 mg | ORAL_CAPSULE | Freq: Two times a day (BID) | ORAL | Status: DC
  Administered 2017-06-22 – 2017-06-24 (×4): 100 mg via ORAL
  Filled 2017-06-20 (×10): qty 1

## 2017-06-20 MED ORDER — NITROGLYCERIN 0.4 MG SL SUBL: 0.4000 mg | SUBLINGUAL_TABLET | SUBLINGUAL | Status: DC | PRN

## 2017-06-20 MED ORDER — SERTRALINE HCL 100 MG PO TABS
100.0000 mg | ORAL_TABLET | Freq: Every day | ORAL | Status: DC
  Administered 2017-06-23 – 2017-06-24 (×2): 100 mg via ORAL
  Filled 2017-06-20 (×4): qty 1

## 2017-06-20 MED ORDER — EZETIMIBE 10 MG PO TABS
10.0000 mg | ORAL_TABLET | Freq: Every day | ORAL | Status: DC
  Administered 2017-06-22 – 2017-06-23 (×2): 10 mg via ORAL
  Filled 2017-06-20 (×4): qty 1

## 2017-06-20 MED ORDER — SODIUM CHLORIDE 0.9 % IV SOLN: INTRAVENOUS | Status: DC

## 2017-06-20 MED ORDER — RAMIPRIL 10 MG PO CAPS: 10.0000 mg | ORAL_CAPSULE | Freq: Every day | ORAL | Status: DC

## 2017-06-20 MED ORDER — RISPERIDONE 0.25 MG PO TABS
0.2500 mg | ORAL_TABLET | Freq: Two times a day (BID) | ORAL | Status: DC | PRN
  Filled 2017-06-20 (×3): qty 1

## 2017-06-20 MED ORDER — CARVEDILOL 25 MG PO TABS
25.0000 mg | ORAL_TABLET | Freq: Two times a day (BID) | ORAL | Status: DC
  Administered 2017-06-20 – 2017-06-24 (×6): 25 mg via ORAL
  Filled 2017-06-20 (×7): qty 1

## 2017-06-20 MED ORDER — NIACIN ER (ANTIHYPERLIPIDEMIC) 500 MG PO TBCR
500.0000 mg | EXTENDED_RELEASE_TABLET | Freq: Every day | ORAL | Status: DC
  Administered 2017-06-22 – 2017-06-23 (×2): 500 mg via ORAL
  Filled 2017-06-20 (×5): qty 1

## 2017-06-20 MED ORDER — DONEPEZIL HCL 5 MG PO TABS
5.0000 mg | ORAL_TABLET | Freq: Every day | ORAL | Status: DC
  Administered 2017-06-22 – 2017-06-23 (×2): 5 mg via ORAL
  Filled 2017-06-20 (×4): qty 1

## 2017-06-20 MED ORDER — PIPERACILLIN-TAZOBACTAM 3.375 G IVPB
3.3750 g | Freq: Three times a day (TID) | INTRAVENOUS | Status: DC
  Administered 2017-06-20 – 2017-06-24 (×13): 3.375 g via INTRAVENOUS
  Filled 2017-06-20 (×14): qty 50

## 2017-06-20 MED ORDER — DIGOXIN 125 MCG PO TABS
0.0625 mg | ORAL_TABLET | Freq: Every day | ORAL | Status: DC
  Administered 2017-06-23 – 2017-06-24 (×2): 0.0625 mg via ORAL
  Filled 2017-06-20 (×3): qty 1

## 2017-06-20 MED ORDER — ACETAMINOPHEN 650 MG RE SUPP
650.0000 mg | Freq: Once | RECTAL | Status: AC
  Administered 2017-06-20: 650 mg via RECTAL
  Filled 2017-06-20: qty 1

## 2017-06-20 NOTE — ED Notes (Signed)
Pt had an additional 2000 ml that was hung as primary for the 2 antibiotics.

## 2017-06-20 NOTE — ED Provider Notes (Addendum)
[facility] EMERGENCY DEPARTMENT Provider Note   CSN: [id number] Arrival date & time: 06/19/17  2248     History   Chief Complaint Chief Complaint  Patient presents with  . Altered Mental Status   Level 5 caveat: Altered mental status.  HPI Ronnie Nelson is a 72 y.o. male.  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  Family reports decreased appetite as well.  His weakness is not been focal to one side but generalized.  No reports of vomiting or diarrhea.  Plan dementia.  Majority of information is obtained from family.  Found to have a fever of 102.9 on arrival to the ER   Past Medical History:  Diagnosis Date  . CAD (coronary artery disease)    a. CABG 1996: SVG to LAD,LIMA to CX,SVG to RCA  . DM (diabetes mellitus) ([place])   . HTN (hypertension)   . Hyperlipidemia   . ICD (implantable cardioverter-defibrillator) in place    a. Generator Medtronic Evera MRI [place] VR model [id number] serial number [id number]  . Ischemic cardiomyopathy    a.s/p ICD placement    Patient Active Problem List   Diagnosis Date Noted  . 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 ICD (implantable cardioverter-defibrillator) lead failure 05/09/2014  . DM2 (diabetes mellitus, type 2) ([place]) 11/14/2012  . HTN (hypertension) 11/14/2012  . CAD (coronary artery disease) 11/14/2012  . Cardiomyopathy, ischemic 11/14/2012  . Chronic systolic CHF (congestive heart failure) ([place]) 11/14/2012  . NSVT (nonsustained ventricular tachycardia) ([place]) 11/14/2012  . ICD (implantable cardioverter-defibrillator) in place 11/14/2012  . Hyperlipidemia 11/14/2012  . Low TSH level 11/14/2012    Past Surgical History:  Procedure Laterality Date  . CARDIAC  CATHETERIZATION  11/14/1998   severe depressed LV systolic function EF 44-01%  . CARDIAC DEFIBRILLATOR PLACEMENT  06/08/07   Medtronic  . CORONARY ARTERY BYPASS GRAFT  1996   [facility]  . IMPLANTABLE CARDIOVERTER DEFIBRILLATOR GENERATOR CHANGE N/A 05/09/2014   Procedure: IMPLANTABLE CARDIOVERTER DEFIBRILLATOR GENERATOR CHANGE;  Surgeon: Sanda Klein, MD;  Location: [facility];  Service: Cardiovascular;  Laterality: N/A;  . NM MYOCAR PERF WALL MOTION  11/21/2011   no significant ischemia        Home Medications    Prior to Admission medications   Medication Sig Start Date End Date Taking? Authorizing Provider  amantadine (SYMMETREL) 100 MG capsule Take 100 mg by mouth 2 (two) times daily.   Yes [provider]  aspirin EC 81 MG tablet Take 81 mg by mouth daily.   Yes [provider]  carvedilol (COREG) 25 MG tablet TAKE 1 TABLET BY MOUTH TWICE A DAY WITH A MEAL 03/31/16  Yes Croitoru, Mihai, MD  digoxin (LANOXIN) 0.125 MG tablet Take 0.0625 mg by mouth daily.    Yes [provider]  donepezil (ARICEPT) 5 MG tablet Take 5 mg by mouth at bedtime.   Yes [provider]  ezetimibe (ZETIA) 10 MG tablet Take 10 mg by mouth at bedtime.    Yes [provider]  furosemide (LASIX) 20 MG tablet Take 20 mg by mouth daily.    Yes [provider]  niacin (NIASPAN) 500 MG CR tablet Take 500 mg by mouth at bedtime.  04/04/14  Yes [provider]  nitroGLYCERIN (  NITROSTAT) 0.4 MG SL tablet Place 0.4 mg under the tongue every 5 (five) minutes as needed for chest pain.   Yes [provider]  omeprazole (PRILOSEC) 20 MG capsule Take 20 mg by mouth 2 (two) times daily.    Yes [provider]  ramipril (ALTACE) 10 MG capsule Take 10 mg by mouth daily.   Yes [provider]  risperiDONE (RISPERDAL) 0.25 MG tablet Take 0.25 mg by mouth 2 (two) times daily as needed (agitation).   Yes [provider]    sertraline (ZOLOFT) 100 MG tablet Take 100 mg by mouth daily.   Yes [provider]  sucralfate (CARAFATE) 1 G tablet Take 1 g by mouth 4 (four) times daily -  with meals and at bedtime.  11/01/12  Yes [provider]    Family History No family history on file.  Social History Social History   Tobacco Use  . Smoking status: Former Smoker    Last attempt to quit: 02/10/1995    Years since quitting: 22.3  . Smokeless tobacco: Never Used  Substance Use Topics  . Alcohol use: No  . Drug use: No     Allergies   Patient has no known allergies.   Review of Systems Review of Systems  Unable to perform ROS: Mental status change     Physical Exam Updated Vital Signs BP (!) 140/94   Pulse 95   Temp (!) 102.9 F (39.4 C) (Oral)   Resp 19   SpO2 97%   Physical Exam  Constitutional: He is oriented to person, place, and time. He appears well-developed and well-nourished.  HENT:  Head: Normocephalic and atraumatic.  Eyes: EOM are normal.  Neck: Normal range of motion. Neck supple.  No meningeal signs  Cardiovascular: Normal rate and regular rhythm.  Pulmonary/Chest: Effort normal and breath sounds normal.  Abdominal: Soft. He exhibits no distension and no mass. There is tenderness. There is no guarding.  Musculoskeletal: Normal range of motion.  Neurological: He is alert and oriented to person, place, and time.  Psychiatric: He has a normal mood and affect.  Nursing note and vitals reviewed.    ED Treatments / Results  Labs (all labs ordered are listed, but only abnormal results are displayed) Labs Reviewed  CBC - Abnormal; Notable for the following components:      Result Value   RBC 6.03 (*)    Hemoglobin 18.7 (*)    HCT 56.0 (*)    Platelets 146 (*)    All other components within normal limits  COMPREHENSIVE METABOLIC PANEL - Abnormal; Notable for the following components:   Sodium 149 (*)    Glucose, Bld 104 (*)    BUN 26 (*)    Total  Bilirubin 2.9 (*)    GFR calc non Af Amer 59 (*)    All other components within normal limits  URINALYSIS, ROUTINE W REFLEX MICROSCOPIC - Abnormal; Notable for the following components:   Color, Urine AMBER (*)    Specific Gravity, Urine 1.031 (*)    Hgb urine dipstick SMALL (*)    Bilirubin Urine SMALL (*)    Ketones, ur 20 (*)    Protein, ur 100 (*)    Bacteria, UA RARE (*)    All other components within normal limits  RAPID URINE DRUG SCREEN, HOSP PERFORMED - Abnormal; Notable for the following components:   Benzodiazepines POSITIVE (*)    All other components within normal limits  CBG MONITORING, ED - Abnormal;  Notable for the following components:   Glucose-Capillary 100 (*)    All other components within normal limits  I-STAT CG4 LACTIC ACID, ED - Abnormal; Notable for the following components:   Lactic Acid, Venous 3.08 (*)    All other components within normal limits  I-STAT CG4 LACTIC ACID, ED - Abnormal; Notable for the following components:   Lactic Acid, Venous 2.04 (*)    All other components within normal limits  CULTURE, BLOOD (ROUTINE X 2)  CULTURE, BLOOD (ROUTINE X 2)  URINE CULTURE  PROTIME-INR  APTT  DIFFERENTIAL  DIGOXIN LEVEL  I-STAT TROPONIN, ED  CBG MONITORING, ED    EKG EKG Interpretation  Date/Time:  Friday Jun 19 2017 23:12:04 EDT Ventricular Rate:  107 PR Interval:  150 QRS Duration: 142 QT Interval:  402 QTC Calculation: 536 R Axis:   154 Text Interpretation:  Sinus tachycardia Left atrial enlargement Right bundle branch block Left posterior fascicular block ** Bifascicular block  Inferior infarct , age undetermined Abnormal ECG tachycardia otherwise no significant change Confirmed by Jola Schmidt [phone]) on 06/19/2017 11:23:04 PM   Radiology Ct Head Wo Contrast  Result Date: 06/20/2017 CLINICAL DATA:  72 y/o M; 3 days of altered mental status, weakness, inability to walk, not eating. EXAM: CT HEAD WITHOUT CONTRAST TECHNIQUE: Contiguous  axial images were obtained from the base of the skull through the vertex without intravenous contrast. COMPARISON:  06/09/2017 CT head.  05/16/2017 CT angiogram head. FINDINGS: Brain: No evidence of acute infarction, hemorrhage, focal mass effect, or extra-axial collection. Stable chronic microvascular ischemic changes and parenchymal volume loss of brain. Stable lateral and third ventriculomegaly. Small chronic infarct in the left occipital lobe. 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 Vascular: Calcific atherosclerosis of carotid siphons. No hyperdense vessel. Skull: Normal. Negative for fracture or focal lesion. Sinuses/Orbits: No acute finding. Other: None. IMPRESSION: 1. No acute intracranial abnormality identified. 2. Stable small chronic infarct in left occipital lobe, chronic microvascular ischemic changes of the brain, and parenchymal volume loss. 3. 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 4. 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 Electronically Signed   By: Kristine Garbe M.D.   On: 06/20/2017 00:35   Ct Abdomen Pelvis W Contrast  Result Date: 06/20/2017 CLINICAL DATA:  72 year old male with fever of unknown origin. Abdominal pain. EXAM: CT ABDOMEN AND PELVIS WITH CONTRAST TECHNIQUE: Multidetector CT imaging of the abdomen and pelvis was performed using the standard protocol following bolus administration of intravenous contrast. CONTRAST:  146mL OMNIPAQUE IOHEXOL 300 MG/ML  SOLN COMPARISON:  Abdominal CT dated 04/30/2017 FINDINGS: Evaluation is limited due to streak artifact caused by patient's arms and cardiac AICD wire. Lower chest: Minimal bibasilar atelectatic changes. Slight interlobular septal prominence may represent a degree of congestion. There is moderate cardiomegaly.  Multi vessel coronary vascular calcification and partially visualized cardiac AICD device. No intra-abdominal free air or free fluid. Hepatobiliary: Probable mild fatty infiltration of the liver. No intrahepatic biliary ductal dilatation. Cholecystectomy. Pancreas: Unremarkable. No pancreatic ductal dilatation or surrounding inflammatory changes. Spleen: Normal in size without focal abnormality. Adrenals/Urinary Tract: The adrenal glands are unremarkable as visualized. Areas of old infarct and scarring noted in the interpolar aspect of the right kidney as well as inferior pole of the left kidney likely sequela of chronic infection. There is no hydronephrosis on either side. There  is symmetric enhancement and excretion of contrast by both kidneys. The visualized ureters appear unremarkable. There is a 2 cm right bladder diverticulum. Mild diffuse thickened appearance of the bladder wall, likely related to underdistention. Correlation with urinalysis recommended to exclude UTI. Stomach/Bowel: There is mild haziness of the mesentery adjacent to a loop of bowel in pelvis (series 3, image 62) findings may represent mild enteritis. Clinical correlation is recommended. There is no bowel obstruction. Loose stool noted in the proximal colon. Formed stool is seen in the distal colon. The appendix is normal. Vascular/Lymphatic: There is advanced aortoiliac atherosclerotic disease. 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 No portal venous gas. There is no adenopathy. Reproductive: The prostate and seminal vesicles are grossly unremarkable. Other: Mild diffuse subcutaneous edema. Musculoskeletal: Degenerative changes of the spine primarily at L5-S1. No acute osseous pathology. IMPRESSION: 1. Mild haziness of the mesentery may be related to diffuse edema or represent mild enteritis. Clinical correlation is recommended. No bowel obstruction. Normal appendix. 2. Fatty liver. 3. Areas of old infarct and scarring in the kidneys.  No hydronephrosis. Correlation with urinalysis recommended to exclude UTI. 4. 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 Recommend followup by ultrasound in 5 years. This recommendation follows ACR consensus guidelines: White Paper of the ACR Incidental Findings Committee II on Vascular Findings. J Am Coll Radiol 2013; 10:789-794. 5. Advanced Aortic Atherosclerosis (ICD10-I70.0). Electronically Signed   By: Anner Crete M.D.   On: 06/20/2017 03:14   Dg Chest Portable 1 View  Result Date: 06/20/2017 CLINICAL DATA:  72 y/o M; 3 days of altered mental status, weakness, inability to walk, not eating. EXAM: PORTABLE CHEST 1 VIEW COMPARISON:  05/18/2017 chest radiograph FINDINGS: Stable cardiomegaly post median sternotomy and CABG given differences in technique. Two lead AICD. Aortic atherosclerosis with calcification. Clear lungs. No pleural effusion or pneumothorax. No acute osseous abnormality is evident. IMPRESSION: Stable cardiomegaly.  No acute pulmonary process identified. Electronically Signed   By: Kristine Garbe M.D.   On: 06/20/2017 00:13    Procedures .Critical Care Performed by: Jola Schmidt, MD Authorized by: Jola Schmidt, MD     CRITICAL CARE Performed by: Jola Schmidt Total critical care time: 35 minutes Critical care time was exclusive of separately billable procedures and treating other patients. Critical care was necessary to treat or prevent imminent or life-threatening deterioration. Critical care was time spent personally by me on the following activities: development of treatment plan with patient and/or surrogate as well as nursing, discussions with consultants, evaluation of patient's response to treatment, examination of patient, obtaining history from patient or surrogate, ordering and performing treatments and interventions, ordering and review of laboratory studies, ordering and review of radiographic studies,  pulse oximetry and re-evaluation of patient's condition.   Medications Ordered in ED Medications  vancomycin (VANCOCIN) IVPB 750 mg/150 ml premix (has no administration in time range)  piperacillin-tazobactam (ZOSYN) IVPB 3.375 g (has no administration in time range)  piperacillin-tazobactam (ZOSYN) IVPB 3.375 g (0 g Intravenous Stopped 06/20/17 0035)  vancomycin (VANCOCIN) IVPB 1000 mg/200 mL premix (0 mg Intravenous Stopped 06/20/17 0124)  sodium chloride 0.9 % bolus 1,000 mL (0 mLs Intravenous Stopped 06/20/17 0023)  sodium chloride 0.9 % bolus 1,000 mL (0 mLs Intravenous Stopped 06/20/17 0023)  sodium chloride 0.9 % bolus 500 mL (0 mLs Intravenous Stopped 06/20/17 0035)  acetaminophen (TYLENOL) suppository 650 mg (650 mg Rectal Given 06/20/17 0024)  iohexol (OMNIPAQUE) 300 MG/ML solution 100 mL (100  mLs Intravenous Contrast Given 06/20/17 0238)     Initial Impression / Assessment and Plan / ED Course  I have reviewed the triage vital signs and the nursing notes.  Pertinent labs & imaging results that were available during my care of the patient were reviewed by me and considered in my medical decision making (see chart for details).     Altered mental status in the setting of fever.  Likely delirium.  Sepsis.  Febrile.  Broad-spectrum antibiotics and cultures.  Patient will enteritis on CT imaging.  No other acute intra-abdominal pathology found.  Chest x-ray without obvious pneumonia.  Mental status improved with control of his fever.  Doubt meningitis/encephalitis.  No indication for lumbar puncture at this time.  Consultant: [facility] hospitalist  Admit  Final Clinical Impressions(s) / ED Diagnoses   Final diagnoses:  Sepsis, due to unspecified organism [facility])    ED Discharge Orders    None       Jola Schmidt, MD 06/24/17 3435    Jola Schmidt, MD 07/07/17 [phone]

## 2017-06-20 NOTE — ED Notes (Signed)
Pt refused to let staff stick him for blood despite this RN explaining the importance of getting more blood from him, this RN attempted to obtain blood from patients saline lock without success.

## 2017-06-20 NOTE — ED Notes (Signed)
Dr. Vista Lawman called this RN regarding page about pts elevated troponin, advised to complete EKG at this time

## 2017-06-20 NOTE — Progress Notes (Signed)
Pharmacy Antibiotic Note  Ronnie Nelson is a 72 y.o. male admitted on 06/19/2017 with sepsis.  Pharmacy has been consulted for Vancomycin/Zosyn dosing. From home with altered mental status. WBC WNL. Renal function age appropriate. Lactic acid is elevated. Current imaging negative for infectious process.   Plan: Vancomycin 750 mg IV q12h Zosyn 3.375G IV q8h to be infused over 4 hours Trend WBC, temp, renal function  F/U infectious work-up Drug levels as indicated  Temp (24hrs), Avg:102.9 F (39.4 C), Min:102.9 F (39.4 C), Max:102.9 F (39.4 C)  Recent Labs  Lab 06/19/17 2313 06/19/17 2342  WBC 8.3  --   CREATININE 1.22  --   LATICACIDVEN  --  3.08*    CrCl cannot be calculated (Unknown ideal weight.).    No Known Allergies   Ronnie Nelson 06/20/2017 12:52 AM

## 2017-06-20 NOTE — Progress Notes (Signed)
Pt refusing to let nurse take blood sugar

## 2017-06-20 NOTE — Progress Notes (Signed)
Pt admitted to floor disoriented x4 and physically combative towards staff members kicking, and swingging at nurse and nurse tech. Pt is  refusing to let Rn evaluate skin, and do a full assessment. Pt refusing to answer any questions. When RN attempted to give pt medication pt grabbed Rn's wrist and began to squeeze when RN asked pt to let go the pt squeezed wrist tighter when asked a second time to let go of nurse pt replied "Im going to break your wrist". Nurse attempted to free arm from Pts grip with help of family members. Pt visibly agitated family members asked nurse to please leave pt alone at this time. Will continue to monitor

## 2017-06-20 NOTE — ED Notes (Signed)
Pt refusing to wear mask for transport. SWOT transporting pt.

## 2017-06-20 NOTE — ED Notes (Signed)
Dr. Vista Lawman notified that EKG is in patient's chart and ready for his review

## 2017-06-20 NOTE — H&P (Addendum)
History and Physical    Ronnie Nelson ION:[id number] DOB: Aug 30, 1945 DOA: 06/19/2017  PCP: Coy Saunas, MD  Patient coming from: Home.  History obtained from ER physician who discussed with family.  Family not at bedside.  Unable to contact at this time.  Chief Complaint: Confusion.  HPI: Ronnie Nelson is a 72 y.o. male with history of dementia, cardiomyopathy, CAD status post CABG, hypertension and diabetes mentioned in the chart but not on medication who was admitted last month for occipital stroke eventually discharged to rehab and back to home was brought to the ER after patient was found to be increasingly confused over the last 3 days.  Has not been eating well.  No nausea vomiting or diarrhea has not complained of any chest pain.  ED Course: In the ER patient was confused not talking was found to have a fever of 102 F.  Labs revealed creatinine increased from his baseline of normal last December 20 18-1.2.  Sodium also is 147 with hemoglobin which are usually runs around 15 is 18 all of which shows that patient is having dehydration.  Patient was started on fluids and antibiotics following which patient has become more alert and I have started talking.  CT of the head does not show anything acute except for the occipital infarct and schwannoma which has been seen last month also.  Patient on my exam is oriented to his name and moves all extremities.  Confused.  Review of Systems: As per HPI, rest all negative.   Past Medical History:  Diagnosis Date  . CAD (coronary artery disease)    a. CABG 1996: SVG to LAD,LIMA to CX,SVG to RCA  . DM (diabetes mellitus) ([place])   . HTN (hypertension)   . Hyperlipidemia   . ICD (implantable cardioverter-defibrillator) in place    a. Generator Medtronic Evera MRI [place] VR model [id number] serial number [id number]  . Ischemic cardiomyopathy    a.s/p ICD placement    Past Surgical History:  Procedure Laterality Date  . CARDIAC CATHETERIZATION   11/14/1998   severe depressed LV systolic function EF 24-40%  . CARDIAC DEFIBRILLATOR PLACEMENT  06/08/07   [employer]tronic  . CORONARY ARTERY BYPASS GRAFT  1996   [facility]  . IMPLANTABLE CARDIOVERTER DEFIBRILLATOR GENERATOR CHANGE N/A 05/09/2014   Procedure: IMPLANTABLE CARDIOVERTER DEFIBRILLATOR GENERATOR CHANGE;  Surgeon: Sanda Klein, MD;  Location: [place] CATH LAB;  Service: Cardiovascular;  Laterality: N/A;  . NM MYOCAR PERF WALL MOTION  11/21/2011   no significant ischemia     reports that he quit smoking about 22 years ago. He has never used smokeless tobacco. He reports that he does not drink alcohol or use drugs.  No Known Allergies  Family History  Family history unknown: Yes    Prior to Admission medications   Medication Sig Start Date End Date Taking? Authorizing Provider  amantadine (SYMMETREL) 100 MG capsule Take 100 mg by mouth 2 (two) times daily.   Yes [provider]  aspirin EC 81 MG tablet Take 81 mg by mouth daily.   Yes [provider]  carvedilol (COREG) 25 MG tablet TAKE 1 TABLET BY MOUTH TWICE A DAY WITH A MEAL 03/31/16  Yes Croitoru, Mihai, MD  digoxin (LANOXIN) 0.125 MG tablet Take 0.0625 mg by mouth daily.    Yes [provider]  donepezil (ARICEPT) 5 MG tablet Take 5 mg by mouth at bedtime.   Yes [provider]  ezetimibe (ZETIA) 10 MG tablet  Take 10 mg by mouth at bedtime.    Yes [provider]  furosemide (LASIX) 20 MG tablet Take 20 mg by mouth daily.    Yes [provider]  niacin (NIASPAN) 500 MG CR tablet Take 500 mg by mouth at bedtime.  04/04/14  Yes [provider]  nitroGLYCERIN (NITROSTAT) 0.4 MG SL tablet Place 0.4 mg under the tongue every 5 (five) minutes as needed for chest pain.   Yes [provider]  omeprazole (PRILOSEC) 20 MG capsule Take 20 mg by mouth 2 (two) times daily.    Yes [provider]  ramipril (ALTACE) 10 MG capsule Take 10 mg by mouth daily.   Yes  [provider]  risperiDONE (RISPERDAL) 0.25 MG tablet Take 0.25 mg by mouth 2 (two) times daily as needed (agitation).   Yes [provider]  sertraline (ZOLOFT) 100 MG tablet Take 100 mg by mouth daily.   Yes [provider]  sucralfate (CARAFATE) 1 G tablet Take 1 g by mouth 4 (four) times daily -  with meals and at bedtime.  11/01/12  Yes [provider]    Physical Exam: Vitals:   06/20/17 0245 06/20/17 0301 06/20/17 0315 06/20/17 0330  BP: (!) 139/92 (!) 140/94 135/87 138/84  Pulse: 99 95 93 96  Resp: 20 19 12 17   Temp:      TempSrc:      SpO2: 98% 97% 95% 98%      Constitutional: Moderately built and nourished. Vitals:   06/20/17 0245 06/20/17 0301 06/20/17 0315 06/20/17 0330  BP: (!) 139/92 (!) 140/94 135/87 138/84  Pulse: 99 95 93 96  Resp: 20 19 12 17   Temp:      TempSrc:      SpO2: 98% 97% 95% 98%   Eyes: Anicteric no pallor. ENMT: No discharge from the ears eyes nose or mouth. Neck: No mass felt.  No neck rigidity.  No JVD appreciated. Respiratory: No rhonchi or crepitations. Cardiovascular: S1-S2 heard no murmurs appreciated. Abdomen: Soft nontender bowel sounds present.  No guarding or rigidity. Musculoskeletal: No edema.  No joint effusion. Skin: No rash.  Skin appears warm. Neurologic: Alert awake oriented to his name.  Moves all extremities. Psychiatric: Appears confused but oriented to his name.   Labs on Admission: I have personally reviewed following labs and imaging studies  CBC: Recent Labs  Lab 06/19/17 2313  WBC 8.3  NEUTROABS 5.4  HGB 18.7*  HCT 56.0*  MCV 92.9  PLT 751*   Basic Metabolic Panel: Recent Labs  Lab 06/19/17 2313  NA 149*  K 4.3  CL 111  CO2 25  GLUCOSE 104*  BUN 26*  CREATININE 1.22  CALCIUM 10.0   GFR: CrCl cannot be calculated (Unknown ideal weight.). Liver Function Tests: Recent Labs  Lab 06/19/17 2313  AST 37  ALT 23  ALKPHOS 83  BILITOT 2.9*  PROT 7.5  ALBUMIN  4.2   No results for input(s): LIPASE, AMYLASE in the last 168 hours. No results for input(s): AMMONIA in the last 168 hours. Coagulation Profile: Recent Labs  Lab 06/19/17 2313  INR 1.18   Cardiac Enzymes: No results for input(s): CKTOTAL, CKMB, CKMBINDEX, TROPONINI in the last 168 hours. BNP (last 3 results) No results for input(s): PROBNP in the last 8760 hours. HbA1C: No results for input(s): HGBA1C in the last 72 hours. CBG: Recent Labs  Lab 06/19/17 2307  GLUCAP 100*   Lipid Profile: No results for input(s): CHOL, HDL, LDLCALC,  TRIG, CHOLHDL, LDLDIRECT in the last 72 hours. Thyroid Function Tests: No results for input(s): TSH, T4TOTAL, FREET4, T3FREE, THYROIDAB in the last 72 hours. Anemia Panel: No results for input(s): VITAMINB12, FOLATE, FERRITIN, TIBC, IRON, RETICCTPCT in the last 72 hours. Urine analysis:    Component Value Date/Time   COLORURINE AMBER (A) 06/20/2017 0035   APPEARANCEUR CLEAR 06/20/2017 0035   LABSPEC 1.031 (H) 06/20/2017 0035   PHURINE 5.0 06/20/2017 0035   GLUCOSEU NEGATIVE 06/20/2017 0035   HGBUR SMALL (A) 06/20/2017 0035   BILIRUBINUR SMALL (A) 06/20/2017 0035   KETONESUR 20 (A) 06/20/2017 0035   PROTEINUR 100 (A) 06/20/2017 0035   NITRITE NEGATIVE 06/20/2017 0035   LEUKOCYTESUR NEGATIVE 06/20/2017 0035   Sepsis Labs: @LABRCNTIP (procalcitonin:4,lacticidven:4) )No results found for this or any previous visit (from the past 240 hour(s)).   Radiological Exams on Admission: Ct Head Wo Contrast  Result Date: 06/20/2017 CLINICAL DATA:  72 y/o M; 3 days of altered mental status, weakness, inability to walk, not eating. EXAM: CT HEAD WITHOUT CONTRAST TECHNIQUE: Contiguous axial images were obtained from the base of the skull through the vertex without intravenous contrast. COMPARISON:  06/09/2017 CT head.  05/16/2017 CT angiogram head. FINDINGS: Brain: No evidence of acute infarction, hemorrhage, focal mass effect, or extra-axial collection.  Stable chronic microvascular ischemic changes and parenchymal volume loss of brain. Stable lateral and third ventriculomegaly. Small chronic infarct in the left occipital lobe. 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 Vascular: Calcific atherosclerosis of carotid siphons. No hyperdense vessel. Skull: Normal. Negative for fracture or focal lesion. Sinuses/Orbits: No acute finding. Other: None. IMPRESSION: 1. No acute intracranial abnormality identified. 2. Stable small chronic infarct in left occipital lobe, chronic microvascular ischemic changes of the brain, and parenchymal volume loss. 3. 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 4. 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 Electronically Signed   By: Kristine Garbe M.D.   On: 06/20/2017 00:35   Ct Abdomen Pelvis W Contrast  Result Date: 06/20/2017 CLINICAL DATA:  72 year old male with fever of unknown origin. Abdominal pain. EXAM: CT ABDOMEN AND PELVIS WITH CONTRAST TECHNIQUE: Multidetector CT imaging of the abdomen and pelvis was performed using the standard protocol following bolus administration of intravenous contrast. CONTRAST:  124mL OMNIPAQUE IOHEXOL 300 MG/ML  SOLN COMPARISON:  Abdominal CT dated 04/30/2017 FINDINGS: Evaluation is limited due to streak artifact caused by patient's arms and cardiac AICD wire. Lower chest: Minimal bibasilar atelectatic changes. Slight interlobular septal prominence may represent a degree of congestion. There is moderate cardiomegaly. Multi vessel coronary vascular calcification and partially visualized cardiac AICD device. No intra-abdominal free air or free fluid. Hepatobiliary: Probable mild fatty infiltration of the liver. No intrahepatic biliary ductal dilatation. Cholecystectomy. Pancreas:  Unremarkable. No pancreatic ductal dilatation or surrounding inflammatory changes. Spleen: Normal in size without focal abnormality. Adrenals/Urinary Tract: The adrenal glands are unremarkable as visualized. Areas of old infarct and scarring noted in the interpolar aspect of the right kidney as well as inferior pole of the left kidney likely sequela of chronic infection. There is no hydronephrosis on either side. There is symmetric enhancement and excretion of contrast by both kidneys. The visualized ureters appear unremarkable. There is a 2 cm right bladder diverticulum. Mild diffuse thickened appearance of the bladder wall, likely related to underdistention. Correlation with urinalysis recommended to exclude UTI. Stomach/Bowel: There  is mild haziness of the mesentery adjacent to a loop of bowel in pelvis (series 3, image 62) findings may represent mild enteritis. Clinical correlation is recommended. There is no bowel obstruction. Loose stool noted in the proximal colon. Formed stool is seen in the distal colon. The appendix is normal. Vascular/Lymphatic: There is advanced aortoiliac atherosclerotic disease. 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 No portal venous gas. There is no adenopathy. Reproductive: The prostate and seminal vesicles are grossly unremarkable. Other: Mild diffuse subcutaneous edema. Musculoskeletal: Degenerative changes of the spine primarily at L5-S1. No acute osseous pathology. IMPRESSION: 1. Mild haziness of the mesentery may be related to diffuse edema or represent mild enteritis. Clinical correlation is recommended. No bowel obstruction. Normal appendix. 2. Fatty liver. 3. Areas of old infarct and scarring in the kidneys. No hydronephrosis. Correlation with urinalysis recommended to exclude UTI. 4. 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 Recommend followup by ultrasound in 5 years. This recommendation follows ACR consensus  guidelines: White Paper of the ACR Incidental Findings Committee II on Vascular Findings. J Am Coll Radiol 2013; 10:789-794. 5. Advanced Aortic Atherosclerosis (ICD10-I70.0). Electronically Signed   By: Anner Crete M.D.   On: 06/20/2017 03:14   Dg Chest Portable 1 View  Result Date: 06/20/2017 CLINICAL DATA:  72 y/o M; 3 days of altered mental status, weakness, inability to walk, not eating. EXAM: PORTABLE CHEST 1 VIEW COMPARISON:  05/18/2017 chest radiograph FINDINGS: Stable cardiomegaly post median sternotomy and CABG given differences in technique. Two lead AICD. Aortic atherosclerosis with calcification. Clear lungs. No pleural effusion or pneumothorax. No acute osseous abnormality is evident. IMPRESSION: Stable cardiomegaly.  No acute pulmonary process identified. Electronically Signed   By: Kristine Garbe M.D.   On: 06/20/2017 00:13    EKG: Independently reviewed.  Sinus tachycardia RBBB.  Assessment/Plan Principal Problem:   Sepsis ([place]) Active Problems:   DM2 (diabetes mellitus, type 2) ([facility])   HTN (hypertension)   CAD (coronary artery disease)   Cardiomyopathy, ischemic   Chronic systolic CHF (congestive heart failure) ([place])   ICD (implantable cardioverter-defibrillator) in place   ICD (implantable cardioverter-defibrillator) battery depletion   ICD (implantable cardioverter-defibrillator) lead failure   Acute encephalopathy    1. Sepsis -source not clear.  Patient is placed on empirically vancomycin and Zosyn.  Follow blood cultures influenza PCR.  Continue with gentle hydration with the patient has history of cardiomyopathy.  Follow lactate levels pro calcitonin levels and all cultures.  If fever continues may have to scan the whole body. 2. Acute encephalopathy with recent stroke and history of dementia -mental status improved with fluids and control of sputum.  Closely observe.  Patient has ICD and cannot have MRI done.  If mental status further decline may need  lumbar puncture but at this time he seems to be improving. 3. Acute renal failure with hyponatremia likely from dehydration -  will hold ACE inhibitor and Lasix for now gently hydrate.  Close to follow metabolic panel. 4. History of ischemic cardiomyopathy appears dehydrated at this time holding off ACE inhibitor and Lasix and gently hydrating.  Close to follow respiratory status.  Digoxin level is pending.  On Coreg. 5. CAD status post CABG he has not had any chest pain.  Cardiac markers will be checked.  On Coreg Zetia and aspirin. 6. Hypertension -holding will ACE inhibitor and will keep patient on PRN IV hydralazine.  Continue Coreg. 7. History of dementia on Aricept and  amantadine Zoloft and Risperdal. 8. Recent occipital stroke. 9. Schwannoma seen in the CAT scan.  Speech therapist evaluation is requested. Will need to get further history from family when available.   DVT prophylaxis: For now patient is kept on SCDs.  If no procedure planned may start patient on Lovenox. Code Status: Full code. Family Communication: No family at the bedside. Disposition Plan: To be determined. Consults called: None. Admission status: Inpatient.   Rise Patience MD [facility] Pager [phone].  If 7PM-7AM, please contact night-coverage [email or web address] Password [facility]  06/20/2017, 5:24 AM

## 2017-06-20 NOTE — ED Notes (Signed)
Per family, pt comes from home after not eating for the past 3 days. Family reports a recent stroke 1 month ago with residual left sided weakness.

## 2017-06-20 NOTE — ED Notes (Signed)
Pt removed pulse ox from finger, refused to let this RN replace pulse ox. Pt confused, stating that "everyone is lying" and that he "has been trying to get the police in here for days."

## 2017-06-20 NOTE — ED Notes (Signed)
CBG taken at 11:59 was 80.

## 2017-06-20 NOTE — Progress Notes (Signed)
[facility]                                                                              Patient Demographics  Ronnie Nelson, is a 72 y.o. male, DOB - [birth year], KDT:[id number]  Admit date - 06/19/2017   Admitting Physician No admitting provider for patient encounter.  Outpatient Primary MD for the patient is Coy Saunas, MD  Outpatient specialists:   LOS - 0  days    Chief Complaint  Patient presents with  . Altered Mental Status       Brief summary   72 year old male with medical history significant for but not limited to dementia and recent CVA presents with acute confusion and fever of 102.9, and noted to be dehydrated with acute kidney injury on arrival to the ER.  Patient admitted for sepsis of unknown source  Assessment & Plan    Principal Problem:   Sepsis ([place]) Active Problems:   DM2 (diabetes mellitus, type 2) ([place])   HTN (hypertension)   CAD (coronary artery disease)   Cardiomyopathy, ischemic   Chronic systolic CHF (congestive heart failure) ([facility])   ICD (implantable cardioverter-defibrillator) in place   ICD (implantable cardioverter-defibrillator) battery depletion   ICD (implantable cardioverter-defibrillator) lead failure   Acute encephalopathy   #1 sepsis: F/u blood cultures with influenza PCR Gentle IV hydration in view of history of cardiomyopathy Lactate and procalcitonin levels Hemodynamic monitoring IV antibiotics  #2 acute encephalopathy: History of dementia with recent stroke No MRI due to AICD May consider LP if not improving Likely due to dehydration or sepsis  #3 dehydration: Likely due to poor oral intake Gentle IV hydration Monitor input and output balance Watch her for fluid overload-high risk  #4: Acute kidney injury: Likely due to dehydration Hold ACE inhibitor and diuretic Monitor renal function with electrolytes  #5 schwannoma: CT finding Outpatient follow-up     DVT prophylaxis: For now  patient is kept on SCDs.  If no procedure planned may start patient on Lovenox. Code Status: Full code. Family Communication:  Disposition Plan: To be determined. Consults called: None      Time Spent in minutes  35 minutes  Procedures:    Consultants:     Antimicrobials:  Vancomycin and Zosyn    Medications  Scheduled Meds: . amantadine  100 mg Oral BID  . aspirin EC  81 mg Oral Daily  . carvedilol  25 mg Oral BID WC  . digoxin  0.0625 mg Oral Daily  . donepezil  5 mg Oral QHS  . ezetimibe  10 mg Oral QHS  . niacin  500 mg Oral QHS  . pantoprazole  40 mg Oral Daily  . sertraline  100 mg Oral Daily  . sucralfate  1 g Oral TID WC & HS   Continuous Infusions: . sodium chloride 75 mL/hr at 06/20/17 0814  . piperacillin-tazobactam (ZOSYN)  IV 3.375 g (06/20/17 0814)  . vancomycin     PRN Meds:.acetaminophen **OR** acetaminophen, hydrALAZINE, nitroGLYCERIN, ondansetron **OR** ondansetron (ZOFRAN) IV, risperiDONE   Antibiotics   Anti-infectives (From admission, onward)   Start     Dose/Rate Route Frequency Ordered  Stop   06/20/17 1000  vancomycin (VANCOCIN) IVPB 750 mg/150 ml premix     750 mg 150 mL/hr over 60 Minutes Intravenous Every 12 hours 06/20/17 0056     06/20/17 0600  piperacillin-tazobactam (ZOSYN) IVPB 3.375 g     3.375 g 12.5 mL/hr over 240 Minutes Intravenous Every 8 hours 06/20/17 0056     06/19/17 2315  piperacillin-tazobactam (ZOSYN) IVPB 3.375 g     3.375 g 100 mL/hr over 30 Minutes Intravenous  Once 06/19/17 2313 06/20/17 0035   06/19/17 2315  vancomycin (VANCOCIN) IVPB 1000 mg/200 mL premix     1,000 mg 200 mL/hr over 60 Minutes Intravenous  Once 06/19/17 2313 06/20/17 0124        Subjective:   Ronnie Nelson was seen and examined today.  Patient remains confused but responsive.  No acute events reported Objective:   Vitals:   06/20/17 0545 06/20/17 0600 06/20/17 0615 06/20/17 0725  BP: 132/86 140/88 (!) 130/92   Pulse:    97  Resp:  (!) 0 15 12 19   Temp:      TempSrc:      SpO2:    100%    Intake/Output Summary (Last 24 hours) at 06/20/2017 0827 Last data filed at 06/20/2017 0124 Gross per 24 hour  Intake 2750 ml  Output -  Net 2750 ml     Wt Readings from Last 3 Encounters:  01/14/17 82.6 kg (182 lb)  01/01/16 84.6 kg (186 lb 9.6 oz)  07/02/15 86.5 kg (190 lb 9.6 oz)     Exam  General: NAD  HEENT: NCAT,  PERRL,MM mildly dry  Neck: SUPPLE, (-) JVD  Cardiovascular: RRR, (-) GALLOP, (-) MURMUR  Respiratory: CTA  Gastrointestinal: SOFT, (-) DISTENSION, BS(+), (_) TENDERNESS  Ext: (-) CYANOSIS, (-) EDEMA  Neuro: A, OX 1  Skin:(-) RASH    Data Reviewed:  I have personally reviewed following labs and imaging studies  Micro Results No results found for this or any previous visit (from the past 240 hour(s)).  Radiology Reports Ct Head Wo Contrast  Result Date: 06/20/2017 CLINICAL DATA:  72 y/o M; 3 days of altered mental status, weakness, inability to walk, not eating. EXAM: CT HEAD WITHOUT CONTRAST TECHNIQUE: Contiguous axial images were obtained from the base of the skull through the vertex without intravenous contrast. COMPARISON:  06/09/2017 CT head.  05/16/2017 CT angiogram head. FINDINGS: Brain: No evidence of acute infarction, hemorrhage, focal mass effect, or extra-axial collection. Stable chronic microvascular ischemic changes and parenchymal volume loss of brain. Stable lateral and third ventriculomegaly. Small chronic infarct in the left occipital lobe. 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 Vascular: Calcific atherosclerosis of carotid siphons. No hyperdense vessel. Skull: Normal. Negative for fracture or focal lesion. Sinuses/Orbits: No acute finding. Other: None. IMPRESSION: 1. No acute intracranial abnormality identified. 2. Stable small chronic infarct in left occipital lobe, chronic microvascular  ischemic changes of the brain, and parenchymal volume loss. 3. 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 4. 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 Electronically Signed   By: Kristine Garbe M.D.   On: 06/20/2017 00:35   Ct Abdomen Pelvis W Contrast  Result Date: 06/20/2017 CLINICAL DATA:  72 year old male with fever of unknown origin. Abdominal pain. EXAM: CT ABDOMEN AND PELVIS WITH CONTRAST TECHNIQUE: Multidetector CT imaging of the abdomen and  pelvis was performed using the standard protocol following bolus administration of intravenous contrast. CONTRAST:  144mL OMNIPAQUE IOHEXOL 300 MG/ML  SOLN COMPARISON:  Abdominal CT dated 04/30/2017 FINDINGS: Evaluation is limited due to streak artifact caused by patient's arms and cardiac AICD wire. Lower chest: Minimal bibasilar atelectatic changes. Slight interlobular septal prominence may represent a degree of congestion. There is moderate cardiomegaly. Multi vessel coronary vascular calcification and partially visualized cardiac AICD device. No intra-abdominal free air or free fluid. Hepatobiliary: Probable mild fatty infiltration of the liver. No intrahepatic biliary ductal dilatation. Cholecystectomy. Pancreas: Unremarkable. No pancreatic ductal dilatation or surrounding inflammatory changes. Spleen: Normal in size without focal abnormality. Adrenals/Urinary Tract: The adrenal glands are unremarkable as visualized. Areas of old infarct and scarring noted in the interpolar aspect of the right kidney as well as inferior pole of the left kidney likely sequela of chronic infection. There is no hydronephrosis on either side. There is symmetric enhancement and excretion of contrast by both kidneys. The visualized ureters appear unremarkable. There is a 2 cm right bladder diverticulum. Mild diffuse thickened appearance of the bladder  wall, likely related to underdistention. Correlation with urinalysis recommended to exclude UTI. Stomach/Bowel: There is mild haziness of the mesentery adjacent to a loop of bowel in pelvis (series 3, image 62) findings may represent mild enteritis. Clinical correlation is recommended. There is no bowel obstruction. Loose stool noted in the proximal colon. Formed stool is seen in the distal colon. The appendix is normal. Vascular/Lymphatic: There is advanced aortoiliac atherosclerotic disease. 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 No portal venous gas. There is no adenopathy. Reproductive: The prostate and seminal vesicles are grossly unremarkable. Other: Mild diffuse subcutaneous edema. Musculoskeletal: Degenerative changes of the spine primarily at L5-S1. No acute osseous pathology. IMPRESSION: 1. Mild haziness of the mesentery may be related to diffuse edema or represent mild enteritis. Clinical correlation is recommended. No bowel obstruction. Normal appendix. 2. Fatty liver. 3. Areas of old infarct and scarring in the kidneys. No hydronephrosis. Correlation with urinalysis recommended to exclude UTI. 4. 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 Recommend followup by ultrasound in 5 years. This recommendation follows ACR consensus guidelines: White Paper of the ACR Incidental Findings Committee II on Vascular Findings. J Am Coll Radiol 2013; 10:789-794. 5. Advanced Aortic Atherosclerosis (ICD10-I70.0). Electronically Signed   By: Anner Crete M.D.   On: 06/20/2017 03:14   Dg Chest Portable 1 View  Result Date: 06/20/2017 CLINICAL DATA:  72 y/o M; 3 days of altered mental status, weakness, inability to walk, not eating. EXAM: PORTABLE CHEST 1 VIEW COMPARISON:  05/18/2017 chest radiograph FINDINGS: Stable cardiomegaly post median sternotomy and CABG given differences in technique. Two lead AICD. Aortic atherosclerosis with calcification. Clear  lungs. No pleural effusion or pneumothorax. No acute osseous abnormality is evident. IMPRESSION: Stable cardiomegaly.  No acute pulmonary process identified. Electronically Signed   By: Kristine Garbe M.D.   On: 06/20/2017 00:13    Lab Data:  CBC: Recent Labs  Lab 06/19/17 2313 06/20/17 0720  WBC 8.3 7.7  NEUTROABS 5.4 4.6  HGB 18.7* 15.5  HCT 56.0* 48.1  MCV 92.9 93.4  PLT 146* PENDING   Basic Metabolic Panel: Recent Labs  Lab 06/19/17 2313  NA 149*  K 4.3  CL 111  CO2 25  GLUCOSE 104*  BUN 26*  CREATININE 1.22  CALCIUM 10.0   GFR: CrCl cannot be calculated (Unknown ideal weight.). Liver Function Tests:  Recent Labs  Lab 06/19/17 2313  AST 37  ALT 23  ALKPHOS 83  BILITOT 2.9*  PROT 7.5  ALBUMIN 4.2   No results for input(s): LIPASE, AMYLASE in the last 168 hours. No results for input(s): AMMONIA in the last 168 hours. Coagulation Profile: Recent Labs  Lab 06/19/17 2313  INR 1.18   Cardiac Enzymes: No results for input(s): CKTOTAL, CKMB, CKMBINDEX, TROPONINI in the last 168 hours. BNP (last 3 results) No results for input(s): PROBNP in the last 8760 hours. HbA1C: No results for input(s): HGBA1C in the last 72 hours. CBG: Recent Labs  Lab 06/19/17 2307  GLUCAP 100*   Lipid Profile: No results for input(s): CHOL, HDL, LDLCALC, TRIG, CHOLHDL, LDLDIRECT in the last 72 hours. Thyroid Function Tests: No results for input(s): TSH, T4TOTAL, FREET4, T3FREE, THYROIDAB in the last 72 hours. Anemia Panel: No results for input(s): VITAMINB12, FOLATE, FERRITIN, TIBC, IRON, RETICCTPCT in the last 72 hours. Urine analysis:    Component Value Date/Time   COLORURINE AMBER (A) 06/20/2017 0035   APPEARANCEUR CLEAR 06/20/2017 0035   LABSPEC 1.031 (H) 06/20/2017 0035   PHURINE 5.0 06/20/2017 0035   GLUCOSEU NEGATIVE 06/20/2017 0035   HGBUR SMALL (A) 06/20/2017 0035   BILIRUBINUR SMALL (A) 06/20/2017 0035   KETONESUR 20 (A) 06/20/2017 0035   PROTEINUR  100 (A) 06/20/2017 0035   NITRITE NEGATIVE 06/20/2017 0035   LEUKOCYTESUR NEGATIVE 06/20/2017 0035     Benito Mccreedy M.D. [facility] 06/20/2017, 8:27 AM  Pager: [phone] Between 7am to 7pm - call Pager - [phone]  After 7pm go to [email or web address] - password [email or web address]  Call night coverage person covering after 7pm

## 2017-06-21 LAB — CBC
HCT: 47.5 % (ref 39.0–52.0)
Hemoglobin: 15.5 g/dL (ref 13.0–17.0)
MCH: 30.2 pg (ref 26.0–34.0)
MCHC: 32.6 g/dL (ref 30.0–36.0)
MCV: 92.6 fL (ref 78.0–100.0)
PLATELETS: 122 10*3/uL — AB (ref 150–400)
RBC: 5.13 MIL/uL (ref 4.22–5.81)
RDW: 14.1 % (ref 11.5–15.5)
WBC: 6.7 10*3/uL (ref 4.0–10.5)

## 2017-06-21 LAB — COMPREHENSIVE METABOLIC PANEL
ALBUMIN: 3.2 g/dL — AB (ref 3.5–5.0)
ALT: 17 U/L (ref 17–63)
AST: 31 U/L (ref 15–41)
Alkaline Phosphatase: 63 U/L (ref 38–126)
Anion gap: 8 (ref 5–15)
BUN: 16 mg/dL (ref 6–20)
CHLORIDE: 114 mmol/L — AB (ref 101–111)
CO2: 27 mmol/L (ref 22–32)
CREATININE: 0.86 mg/dL (ref 0.61–1.24)
Calcium: 8.9 mg/dL (ref 8.9–10.3)
GFR calc Af Amer: >60 mL/min (ref >60)
GFR calc non Af Amer: >60 mL/min (ref >60)
GLUCOSE: 92 mg/dL (ref 65–99)
Potassium: 3.4 mmol/L — ABNORMAL LOW (ref 3.5–5.1)
SODIUM: 149 mmol/L — AB (ref 135–145)
Total Bilirubin: 2.2 mg/dL — ABNORMAL HIGH (ref 0.3–1.2)
Total Protein: 6.3 g/dL — ABNORMAL LOW (ref 6.5–8.1)

## 2017-06-21 LAB — URINE CULTURE: CULTURE: NO GROWTH

## 2017-06-21 LAB — GLUCOSE, CAPILLARY
GLUCOSE-CAPILLARY: 64 mg/dL — AB (ref 65–99)
GLUCOSE-CAPILLARY: 74 mg/dL (ref 65–99)
Glucose-Capillary: 108 mg/dL — ABNORMAL HIGH (ref 65–99)
Glucose-Capillary: 68 mg/dL (ref 65–99)
Glucose-Capillary: 77 mg/dL (ref 65–99)
Glucose-Capillary: 86 mg/dL (ref 65–99)
Glucose-Capillary: 97 mg/dL (ref 65–99)

## 2017-06-21 LAB — TROPONIN I: Troponin I: 0.03 ng/mL (ref <0.03)

## 2017-06-21 LAB — INFLUENZA PANEL BY PCR (TYPE A & B)
Influenza A By PCR: NEGATIVE
Influenza B By PCR: NEGATIVE

## 2017-06-21 MED ORDER — DEXTROSE 50 % IV SOLN
INTRAVENOUS | Status: AC
  Administered 2017-06-21: 25 mL
  Filled 2017-06-21: qty 50

## 2017-06-21 NOTE — Progress Notes (Signed)
Pt still continues to refuse to take medications and will not eat or drink anything. Education attempted but pt very irritable and reinforcement will be required.

## 2017-06-21 NOTE — Progress Notes (Signed)
Hypoglycemic Event  CBG: 64  Treatment: D50 IV 25 mL  Symptoms: Shaky and None  Follow-up CBG: Time: 86 CBG Result: 0829  Possible Reasons for Event: Inadequate meal intake  Comments/MD notified    [place] N Nonnie Pickney

## 2017-06-21 NOTE — Progress Notes (Signed)
[facility]                                                                              Patient Demographics  Ronnie Nelson, is a 72 y.o. male, DOB - [birth year], HAL:[id number]  Admit date - 06/19/2017   Admitting Physician Rise Patience, MD  Outpatient Primary MD for the patient is Coy Saunas, MD  Outpatient specialists:   LOS - 1  days    Chief Complaint  Patient presents with  . Altered Mental Status       Brief summary   72 year old male with medical history significant for but not limited to dementia and recent CVA presents with acute confusion and fever of 102.9, and noted to be dehydrated with acute kidney injury on arrival to the ER.  Patient admitted for sepsis of unknown source  Assessment & Plan    Principal Problem:   Sepsis ([place]) Active Problems:   DM2 (diabetes mellitus, type 2) ([place])   HTN (hypertension)   CAD (coronary artery disease)   Cardiomyopathy, ischemic   Chronic systolic CHF (congestive heart failure) (H[facility])   ICD (implantable cardioverter-defibrillator) in place   ICD (implantable cardioverter-defibrillator) battery depletion   ICD (implantable cardioverter-defibrillator) lead failure   Acute encephalopathy   #1 sepsis: F/u blood cultures with influenza PCR Gentle IV hydration in view of history of cardiomyopathy Lactate and procalcitonin levels Hemodynamic monitoring IV antibiotics  #2 acute encephalopathy: History of dementia with recent stroke No MRI due to AICD May consider LP if not improving Likely due to dehydration or sepsis  #3 dehydration: Likely due to poor oral intake Gentle IV hydration Monitor input and output balance Watch her for fluid overload-high risk  #4: Acute kidney injury: Likely due to dehydration Hold ACE inhibitor and diuretic Monitor renal function with electrolytes  #5 schwannoma: CT finding Outpatient follow-up     DVT prophylaxis: For now patient is kept on SCDs.   If no procedure planned may start patient on Lovenox. Code Status: Full code. Family Communication:  Disposition Plan: To be determined. Consults called: None      Time Spent in minutes  35 minutes  Procedures:    Consultants:     Antimicrobials:  Vancomycin and Zosyn    Medications  Scheduled Meds: . amantadine  100 mg Oral BID  . aspirin EC  81 mg Oral Daily  . carvedilol  25 mg Oral BID WC  . digoxin  0.0625 mg Oral Daily  . donepezil  5 mg Oral QHS  . ezetimibe  10 mg Oral QHS  . niacin  500 mg Oral QHS  . pantoprazole  40 mg Oral Daily  . sertraline  100 mg Oral Daily  . sucralfate  1 g Oral TID WC & HS   Continuous Infusions: . piperacillin-tazobactam (ZOSYN)  IV Stopped (06/21/17 1032)  . vancomycin Stopped (06/21/17 1135)   PRN Meds:.acetaminophen **OR** acetaminophen, hydrALAZINE, nitroGLYCERIN, ondansetron **OR** ondansetron (ZOFRAN) IV, risperiDONE   Antibiotics   Anti-infectives (From admission, onward)   Start     Dose/Rate Route Frequency Ordered Stop   06/20/17 1000  vancomycin (VANCOCIN) IVPB 750 mg/150  ml premix     750 mg 150 mL/hr over 60 Minutes Intravenous Every 12 hours 06/20/17 0056     06/20/17 0600  piperacillin-tazobactam (ZOSYN) IVPB 3.375 g     3.375 g 12.5 mL/hr over 240 Minutes Intravenous Every 8 hours 06/20/17 0056     06/19/17 2315  piperacillin-tazobactam (ZOSYN) IVPB 3.375 g     3.375 g 100 mL/hr over 30 Minutes Intravenous  Once 06/19/17 2313 06/20/17 0035   06/19/17 2315  vancomycin (VANCOCIN) IVPB 1000 mg/200 mL premix     1,000 mg 200 mL/hr over 60 Minutes Intravenous  Once 06/19/17 2313 06/20/17 0124        Subjective:   Ronnie Nelson was seen and examined today.  No fever or chills Objective:   Vitals:   06/20/17 1752 06/20/17 2123 06/21/17 0500 06/21/17 0651  BP: (!) 149/98 (!) 134/98  (!) 148/82  Pulse: 99 79  92  Resp: (!) 24 20  20   Temp: 98.2 F (36.8 C) 97.9 F (36.6 C)    TempSrc: Oral Oral      SpO2: 98% 97%    Weight:   70.9 kg (156 lb 4.9 oz)     Intake/Output Summary (Last 24 hours) at 06/21/2017 1241 Last data filed at 06/21/2017 0700 Gross per 24 hour  Intake 1130 ml  Output 500 ml  Net 630 ml     Wt Readings from Last 3 Encounters:  06/21/17 70.9 kg (156 lb 4.9 oz)  01/14/17 82.6 kg (182 lb)  01/01/16 84.6 kg (186 lb 9.6 oz)     Exam  General: NAD  HEENT: NCAT,  PERRL,MMM  Neck: SUPPLE, (-) JVD  Cardiovascular: RRR, (-) GALLOP, (-) MURMUR  Respiratory: CTA  Gastrointestinal: SOFT, (-) DISTENSION, BS(+), (_) TENDERNESS  Ext: (-) CYANOSIS, (-) EDEMA  Neuro: A, OX 1  Skin:(-) RASH    Data Reviewed:  I have personally reviewed following labs and imaging studies  Micro Results Recent Results (from the past 240 hour(s))  Culture, blood (Routine x 2)     Status: None (Preliminary result)   Collection Time: 06/19/17  1:22 AM  Result Value Ref Range Status   Specimen Description BLOOD LEFT FOREARM  Final   Special Requests   Final    BOTTLES DRAWN AEROBIC AND ANAEROBIC Blood Culture adequate volume   Culture   Final    NO GROWTH 1 DAY Performed at [facility], [address], [place], [place] [address]    Report Status PENDING  Incomplete  Culture, blood (Routine x 2)     Status: None (Preliminary result)   Collection Time: 06/19/17 11:30 PM  Result Value Ref Range Status   Specimen Description BLOOD LEFT WRIST  Final   Special Requests   Final    BOTTLES DRAWN AEROBIC ONLY Blood Culture results may not be optimal due to an inadequate volume of blood received in culture bottles   Culture   Final    NO GROWTH 1 DAY Performed at [facility], [place] [address] [place], [place] [address]    Report Status PENDING  Incomplete  Urine culture     Status: None   Collection Time: 06/20/17 12:35 AM  Result Value Ref Range Status   Specimen Description URINE, CATHETERIZED  Final   Special Requests NONE  Final   Culture   Final    NO  GROWTH Performed at [facility], [place] [address], [place], [place] [address]  Report Status 06/21/2017 FINAL  Final    Radiology Reports Ct Head Wo Contrast  Result Date: 06/20/2017 CLINICAL DATA:  72 y/o M; 3 days of altered mental status, weakness, inability to walk, not eating. EXAM: CT HEAD WITHOUT CONTRAST TECHNIQUE: Contiguous axial images were obtained from the base of the skull through the vertex without intravenous contrast. COMPARISON:  06/09/2017 CT head.  05/16/2017 CT angiogram head. FINDINGS: Brain: No evidence of acute infarction, hemorrhage, focal mass effect, or extra-axial collection. Stable chronic microvascular ischemic changes and parenchymal volume loss of brain. Stable lateral and third ventriculomegaly. Small chronic infarct in the left occipital lobe. 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 Vascular: Calcific atherosclerosis of carotid siphons. No hyperdense vessel. Skull: Normal. Negative for fracture or focal lesion. Sinuses/Orbits: No acute finding. Other: None. IMPRESSION: 1. No acute intracranial abnormality identified. 2. Stable small chronic infarct in left occipital lobe, chronic microvascular ischemic changes of the brain, and parenchymal volume loss. 3. 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 4. 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 Electronically Signed   By: Kristine Garbe M.D.   On: 06/20/2017 00:35   Ct Abdomen Pelvis W Contrast  Result Date: 06/20/2017 CLINICAL DATA:  71 year old male with fever of unknown origin. Abdominal pain. EXAM: CT ABDOMEN AND PELVIS WITH CONTRAST TECHNIQUE: Multidetector CT imaging of the abdomen and pelvis was performed using the standard protocol following bolus administration of intravenous contrast.  CONTRAST:  156mL OMNIPAQUE IOHEXOL 300 MG/ML  SOLN COMPARISON:  Abdominal CT dated 04/30/2017 FINDINGS: Evaluation is limited due to streak artifact caused by patient's arms and cardiac AICD wire. Lower chest: Minimal bibasilar atelectatic changes. Slight interlobular septal prominence may represent a degree of congestion. There is moderate cardiomegaly. Multi vessel coronary vascular calcification and partially visualized cardiac AICD device. No intra-abdominal free air or free fluid. Hepatobiliary: Probable mild fatty infiltration of the liver. No intrahepatic biliary ductal dilatation. Cholecystectomy. Pancreas: Unremarkable. No pancreatic ductal dilatation or surrounding inflammatory changes. Spleen: Normal in size without focal abnormality. Adrenals/Urinary Tract: The adrenal glands are unremarkable as visualized. Areas of old infarct and scarring noted in the interpolar aspect of the right kidney as well as inferior pole of the left kidney likely sequela of chronic infection. There is no hydronephrosis on either side. There is symmetric enhancement and excretion of contrast by both kidneys. The visualized ureters appear unremarkable. There is a 2 cm right bladder diverticulum. Mild diffuse thickened appearance of the bladder wall, likely related to underdistention. Correlation with urinalysis recommended to exclude UTI. Stomach/Bowel: There is mild haziness of the mesentery adjacent to a loop of bowel in pelvis (series 3, image 62) findings may represent mild enteritis. Clinical correlation is recommended. There is no bowel obstruction. Loose stool noted in the proximal colon. Formed stool is seen in the distal colon. The appendix is normal. Vascular/Lymphatic: There is advanced aortoiliac atherosclerotic disease. 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 No portal venous gas. There is no adenopathy. Reproductive: The prostate and seminal vesicles are grossly unremarkable. Other: Mild diffuse subcutaneous  edema. Musculoskeletal: Degenerative changes of the spine primarily at L5-S1. No acute osseous pathology. IMPRESSION: 1. Mild haziness of the mesentery may be related to diffuse edema or represent mild enteritis. Clinical correlation is recommended. No bowel obstruction. Normal appendix. 2. Fatty liver. 3. Areas of old infarct and  scarring in the kidneys. No hydronephrosis. Correlation with urinalysis recommended to exclude UTI. 4. 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 Recommend followup by ultrasound in 5 years. This recommendation follows ACR consensus guidelines: White Paper of the ACR Incidental Findings Committee II on Vascular Findings. J Am Coll Radiol 2013; 10:789-794. 5. Advanced Aortic Atherosclerosis (ICD10-I70.0). Electronically Signed   By: Anner Crete M.D.   On: 06/20/2017 03:14   Dg Chest Portable 1 View  Result Date: 06/20/2017 CLINICAL DATA:  72 y/o M; 3 days of altered mental status, weakness, inability to walk, not eating. EXAM: PORTABLE CHEST 1 VIEW COMPARISON:  05/18/2017 chest radiograph FINDINGS: Stable cardiomegaly post median sternotomy and CABG given differences in technique. Two lead AICD. Aortic atherosclerosis with calcification. Clear lungs. No pleural effusion or pneumothorax. No acute osseous abnormality is evident. IMPRESSION: Stable cardiomegaly.  No acute pulmonary process identified. Electronically Signed   By: Kristine Garbe M.D.   On: 06/20/2017 00:13    Lab Data:  CBC: Recent Labs  Lab 06/19/17 2313 06/20/17 0720 06/21/17 0322  WBC 8.3 7.7 6.7  NEUTROABS 5.4 4.6  --   HGB 18.7* 15.5 15.5  HCT 56.0* 48.1 47.5  MCV 92.9 93.4 92.6  PLT 146* 116* 409*   Basic Metabolic Panel: Recent Labs  Lab 06/19/17 2313 06/20/17 0720 06/21/17 0322  NA 149* 148* 149*  K 4.3 4.1 3.4*  CL 111 116* 114*  CO2 25 22 27   GLUCOSE 104* 84 92  BUN 26* 19 16  CREATININE 1.22 0.86 0.86  CALCIUM 10.0 8.6*  8.9   GFR: Estimated Creatinine Clearance: 75.8 mL/min (by C-G formula based on SCr of 0.86 mg/dL). Liver Function Tests: Recent Labs  Lab 06/19/17 2313 06/20/17 0720 06/21/17 0322  AST 37 28 31  ALT 23 18 17   ALKPHOS 83 67 63  BILITOT 2.9* 2.1* 2.2*  PROT 7.5 6.3* 6.3*  ALBUMIN 4.2 3.4* 3.2*   No results for input(s): LIPASE, AMYLASE in the last 168 hours. No results for input(s): AMMONIA in the last 168 hours. Coagulation Profile: Recent Labs  Lab 06/19/17 2313  INR 1.18   Cardiac Enzymes: Recent Labs  Lab 06/20/17 0720 06/21/17 0322  TROPONINI 0.04* 0.03*   BNP (last 3 results) No results for input(s): PROBNP in the last 8760 hours. HbA1C: No results for input(s): HGBA1C in the last 72 hours. CBG: Recent Labs  Lab 06/21/17 0042 06/21/17 0131 06/21/17 0655 06/21/17 0829 06/21/17 1204  GLUCAP 68 108* 64* 86 97   Lipid Profile: No results for input(s): CHOL, HDL, LDLCALC, TRIG, CHOLHDL, LDLDIRECT in the last 72 hours. Thyroid Function Tests: No results for input(s): TSH, T4TOTAL, FREET4, T3FREE, THYROIDAB in the last 72 hours. Anemia Panel: No results for input(s): VITAMINB12, FOLATE, FERRITIN, TIBC, IRON, RETICCTPCT in the last 72 hours. Urine analysis:    Component Value Date/Time   COLORURINE AMBER (A) 06/20/2017 0035   APPEARANCEUR CLEAR 06/20/2017 0035   LABSPEC 1.031 (H) 06/20/2017 0035   PHURINE 5.0 06/20/2017 0035   GLUCOSEU NEGATIVE 06/20/2017 0035   HGBUR SMALL (A) 06/20/2017 0035   BILIRUBINUR SMALL (A) 06/20/2017 0035   KETONESUR 20 (A) 06/20/2017 0035   PROTEINUR 100 (A) 06/20/2017 0035   NITRITE NEGATIVE 06/20/2017 0035   LEUKOCYTESUR NEGATIVE 06/20/2017 0035     Benito Mccreedy M.D. [facility] 06/21/2017, 12:41 PM  Pager: [phone] Between 7am to 7pm - call Pager - [phone]  After 7pm go to [email or web address] -  password [email or web address]  Call night coverage person covering after 7pm

## 2017-06-21 NOTE — Evaluation (Signed)
Clinical/Bedside Swallow Evaluation Patient Details  Name: Ronnie Nelson MRN: [id number] Date of Birth: [birth year]  Today's Date: 06/21/2017 Time: SLP Start Time (ACUTE ONLY): 0900 SLP Stop Time (ACUTE ONLY): 0930 SLP Time Calculation (min) (ACUTE ONLY): 30 min  Past Medical History:  Past Medical History:  Diagnosis Date  . CAD (coronary artery disease)    a. CABG 1996: SVG to LAD,LIMA to CX,SVG to RCA  . DM (diabetes mellitus) ([place])   . HTN (hypertension)   . Hyperlipidemia   . ICD (implantable cardioverter-defibrillator) in place    a. Generator Medtronic Evera MRI [place] VR model [id number] serial number [id number]  . Ischemic cardiomyopathy    a.s/p ICD placement   Past Surgical History:  Past Surgical History:  Procedure Laterality Date  . CARDIAC CATHETERIZATION  11/14/1998   severe depressed LV systolic function EF 02-77%  . CARDIAC DEFIBRILLATOR PLACEMENT  06/08/07   Medtronic  . CORONARY ARTERY BYPASS GRAFT  1996   [facility]  . IMPLANTABLE CARDIOVERTER DEFIBRILLATOR GENERATOR CHANGE N/A 05/09/2014   Procedure: IMPLANTABLE CARDIOVERTER DEFIBRILLATOR GENERATOR CHANGE;  Surgeon: Sanda Klein, MD;  Location: [facility] CATH [facility];  Service: Cardiovascular;  Laterality: N/A;  . NM MYOCAR PERF WALL MOTION  11/21/2011   no significant ischemia   HPI:  Ronnie Nelson is a 72 y.o. male with history of dementia, cardiomyopathy, CAD status post CABG, hypertension and diabetes mentioned in the chart but not on medication who was admitted last month for occipital stroke eventually discharged to rehab and back to home was brought to the ER after patient was found to be increasingly confused over the last 3 days.  Has not been eating well.  No nausea vomiting or diarrhea has not complained of any chest pain. Admitted with sepsis, source unclear, acute encephalopathy with recent stroke and history of dementia. CXR showed no acute pulmonary process. Head CT negative for acute findings.   Assessment /  Plan / Recommendation Clinical Impression   Patient presents with mild-moderate risk for aspiration due to altered mental status, cognitive impairments. Per chart review, pt agitated last night and refusing PO. Today, he rouses with verbal cues, repositioning. Follows some basic commands, however overall processing slow and pt refused to participate in oral motor examination; no apparent facial droop or weakness noted. Oriented to self only (Y/N question; does not state name). Pt allows removal of left hand mitt only; he makes no effort to retrieve cup but when placed in his hand he does lift cup to drink via straw. No overt signs of aspiration with thin liquids, consumed via straw consecutively in excess of 3 oz. SLP assisted with feeding of items from breakfast tray, including purees and soft solids. There is mild holding with puree, but adequate clearance. With soft solids, pt requires extended time for mastication; holds food in left buccal cavity but ultimately clears with cues and several washes of thin liquid. Recommend downgrade to dys 2, continue thin liquids, meds crushed in puree. Full supervision as pt requires assistance for feeding and fluctuating mentation. Advancement likely as mentation improves. Will follow.    SLP Visit Diagnosis: Dysphagia, unspecified (R13.10)    Aspiration Risk  Mild aspiration risk;Moderate aspiration risk    Diet Recommendation Dysphagia 2 (Fine chop);Thin liquid   Liquid Administration via: Straw;Cup Medication Administration: Crushed with puree Supervision: Full supervision/cueing for compensatory strategies;Staff to assist with self feeding Compensations: Slow rate;Small sips/bites;Follow solids with liquid Postural Changes: Seated upright at 90 degrees    Other  Recommendations Oral Care Recommendations: Oral care BID   Follow up Recommendations Other (comment)(tbd)      Frequency and Duration min 2x/week  2 weeks       Prognosis Prognosis for  Safe Diet Advancement: Good Barriers to Reach Goals: Cognitive deficits      Swallow Study   General Date of Onset: 06/20/17 HPI: Ronnie Nelson is a 72 y.o. male with history of dementia, cardiomyopathy, CAD status post CABG, hypertension and diabetes mentioned in the chart but not on medication who was admitted last month for occipital stroke eventually discharged to rehab and back to home was brought to the ER after patient was found to be increasingly confused over the last 3 days.  Has not been eating well.  No nausea vomiting or diarrhea has not complained of any chest pain. Admitted with sepsis, source unclear, acute encephalopathy with recent stroke and history of dementia. CXR showed no acute pulmonary process. Head CT negative for acute findings. Type of Study: Bedside Swallow Evaluation Previous Swallow Assessment: none in chart Diet Prior to this Study: Regular;Thin liquids Temperature Spikes Noted: Yes(101 5/10) Respiratory Status: Room air History of Recent Intubation: No Behavior/Cognition: Alert;Confused;Requires cueing Oral Cavity Assessment: Within Functional Limits Oral Care Completed by SLP: No Oral Cavity - Dentition: Edentulous(question some teeth; difficult to observe) Vision: Functional for self-feeding Self-Feeding Abilities: Needs assist;Needs set up Patient Positioning: Upright in bed Baseline Vocal Quality: Low vocal intensity Volitional Cough: Cognitively unable to elicit Volitional Swallow: Unable to elicit    Oral/Motor/Sensory Function Overall Oral Motor/Sensory Function: Other (comment)(no apparent facial droop or asymmetry)   Ice Chips Ice chips: Not tested Other Comments: pt refused   Thin Liquid Thin Liquid: Within functional limits Presentation: Straw    Nectar Thick Nectar Thick Liquid: Not tested   Honey Thick Honey Thick Liquid: Not tested   Puree Puree: Impaired Oral Phase Functional Implications: Oral holding(brief holding)   Solid   GO    Solid: Impaired Oral Phase Functional Implications: Prolonged oral transit;Impaired mastication;Oral holding       Deneise Lever, [place], [employer] Pathologist [phone]  Aliene Altes 06/21/2017,9:37 AM

## 2017-06-21 NOTE — Progress Notes (Signed)
Hypoglycemic Event  CBG: 68  Treatment: D50 IV 25 mL (patient is refusing to eat or drink)  Symptoms: None  Follow-up CBG: Time:0131 CBG Result: 108  Possible Reasons for Event: Inadequate meal intake  Comments/MD notified: [place]    [place]

## 2017-06-22 ENCOUNTER — Encounter: Payer: Medicare Other | Admitting: Cardiovascular Disease

## 2017-06-22 DIAGNOSIS — E1159 Type 2 diabetes mellitus with other circulatory complications: Secondary | ICD-10-CM

## 2017-06-22 DIAGNOSIS — Z4502 Encounter for adjustment and management of automatic implantable cardiac defibrillator: Secondary | ICD-10-CM

## 2017-06-22 DIAGNOSIS — I25810 Atherosclerosis of coronary artery bypass graft(s) without angina pectoris: Secondary | ICD-10-CM

## 2017-06-22 DIAGNOSIS — I10 Essential (primary) hypertension: Secondary | ICD-10-CM

## 2017-06-22 LAB — GLUCOSE, CAPILLARY
GLUCOSE-CAPILLARY: 64 mg/dL — AB (ref 65–99)
GLUCOSE-CAPILLARY: 147 mg/dL — AB (ref 65–99)
GLUCOSE-CAPILLARY: 67 mg/dL (ref 65–99)
GLUCOSE-CAPILLARY: 77 mg/dL (ref 65–99)
Glucose-Capillary: 90 mg/dL (ref 65–99)
Glucose-Capillary: 73 mg/dL (ref 65–99)

## 2017-06-22 MED ORDER — MAGNESIUM SULFATE 2 GM/50ML IV SOLN
2.0000 g | Freq: Once | INTRAVENOUS | Status: AC
  Administered 2017-06-22: 2 g via INTRAVENOUS
  Filled 2017-06-22: qty 50

## 2017-06-22 MED ORDER — ENOXAPARIN SODIUM 40 MG/0.4ML ~~LOC~~ SOLN
40.0000 mg | SUBCUTANEOUS | Status: DC
  Administered 2017-06-22 – 2017-06-24 (×3): 40 mg via SUBCUTANEOUS
  Filled 2017-06-22 (×3): qty 0.4

## 2017-06-22 MED ORDER — ORAL CARE MOUTH RINSE
15.0000 mL | Freq: Two times a day (BID) | OROMUCOSAL | Status: DC
  Administered 2017-06-22 – 2017-06-23 (×3): 15 mL via OROMUCOSAL

## 2017-06-22 MED ORDER — DEXTROSE 50 % IV SOLN
INTRAVENOUS | Status: AC
  Administered 2017-06-22: 25 mL
  Filled 2017-06-22: qty 50

## 2017-06-22 MED ORDER — POTASSIUM CHLORIDE CRYS ER 20 MEQ PO TBCR
40.0000 meq | EXTENDED_RELEASE_TABLET | Freq: Once | ORAL | Status: DC
  Filled 2017-06-22: qty 2

## 2017-06-22 NOTE — Progress Notes (Signed)
PtSLP Cancellation Note  Patient Details Name: Ronnie Nelson MRN: [id number] DOB: 05/09/1945   Cancelled treatment:        Patient refusing to take any PO for therapy and refusing nurse at breakfast and medication pass as well.   Charlynne Cousins Elara Cocke 06/22/2017, 12:26 PM

## 2017-06-22 NOTE — Progress Notes (Signed)
Hypoglycemic Event  CBG: 64  Treatment: D50 IV 25 mL ( patient refuse PO intake)  Symptoms: None  Follow-up CBG: Time:147 CBG Result: 0702  Possible Reasons for Event: Inadequate meal intake  Comments/MD notified:     Ronnie Nelson

## 2017-06-22 NOTE — Care Management Note (Signed)
Case Management Note  Patient Details  Name: GREELY ATIYEH MRN: [id number] Date of Birth: Jul 29, 1945  Subjective/Objective:  History of HTN, CAD, CHF, ICD admitted for Sepsis secondary to enteritis.                 Action/Plan: Prior to admission patient lived at home with wife.  At discharge patient plans to return to same living situation.  Patient able to afford medications/food.  At discharge patient has transportation home.  NCM will continue to follow for discharge transition needs.  Expected Discharge Date:     To Be determined             Expected Discharge Plan:  Home/Self Care  In-House Referral:  Clinical Social Work  Discharge planning Services  CM Consult  Status of Service:  In process, will continue to follow  Kristen Cardinal, RN 06/22/2017, 2:30 PM

## 2017-06-22 NOTE — Progress Notes (Signed)
PROGRESS NOTE  Ronnie Nelson YIF:[id number] DOB: 1945-10-28 DOA: 06/19/2017 PCP: Coy Saunas, MD  HPI/Recap of past 29 hours: 72 year old male with medical history significant for but not limited to dementia and recent CVA presents with acute confusion and fever of 102.9, and noted to be dehydrated with acute kidney injury on arrival to the ER.  Patient admitted for sepsis of unknown source.  06/22/2017: Patient seen and examined at his bedside.  He is somnolent but arousable to voices.  He has no new complaints.  CT abdomen and pelvis with contrast on 06/20/2017 revealed mild enteritis.  DC vancomycin and continue Zosyn.    Assessment/Plan: Principal Problem:   Sepsis ([place]) Active Problems:   DM2 (diabetes mellitus, type 2) (HCC)   HTN (hypertension)   CAD (coronary artery disease)   Cardiomyopathy, ischemic   Chronic systolic CHF (congestive heart failure) (HCC)   ICD (implantable cardioverter-defibrillator) in place   ICD (implantable cardioverter-defibrillator) battery depletion   ICD (implantable cardioverter-defibrillator) lead failure   Acute encephalopathy  Sepsis secondary to enteritis, improving Urine analysis and urine culture negative, blood cultures x2- to date, chest x-ray personally reviewed unremarkable for any lobular infiltrates. Continue IV Zosyn Discontinue vancomycin Monitor fever curve  Acute metabolic encephalopathy most likely secondary to sepsis, improving reorient as needed History of dementia with recent stroke No MRI done due to AICD on left side of chest  Dehydration most likely secondary to poor oral intake Speech therapy evaluated and recommended dysphagia 2 diet with fine chopped and thin liquid Aspiration precaution Encourage increase in oral fluid intake  Hypokalemia Potassium 3.4 Repleted with KCl 40 mEq once  Moderate protein calorie malnutrition Albumin 3.2 Encourage increase oral intake  Physical debility PT to  assess  Chronic systolic CHF Last 2D echo done on 02/10/2012 revealed LVEF 40 to 45% Caution with IV fluid Continue strict I's and O's, daily weight    Code Status: Full  Family Communication: None at bedside  Disposition Plan: SNF/home when clinically stable   Consultants: None  Procedures:  None  Antimicrobials:  IV Zosyn  DVT prophylaxis: Subcu Lovenox   Objective: Vitals:   06/21/17 1805 06/22/17 0111 06/22/17 0500 06/22/17 0606  BP: (!) 151/99 (!) 152/103  138/89  Pulse: 89 99  89  Resp:  20  18  Temp: 97.6 F (36.4 C) 98.2 F (36.8 C)  98.5 F (36.9 C)  TempSrc: Oral     SpO2:    99%  Weight:   70.7 kg (155 lb 13.8 oz)     Intake/Output Summary (Last 24 hours) at 06/22/2017 1150 Last data filed at 06/22/2017 1114 Gross per 24 hour  Intake 0 ml  Output 375 ml  Net -375 ml   Filed Weights   06/21/17 0500 06/22/17 0500  Weight: 70.9 kg (156 lb 4.9 oz) 70.7 kg (155 lb 13.8 oz)    Exam:  . General: 72 y.o. year-old male well developed well nourished in no acute distress.  Somnolent but arousable to voices. . Cardiovascular: Regular rate and rhythm with no rubs or gallops.  No thyromegaly or JVD noted.   Marland Kitchen Respiratory: Clear to auscultation with no wheezes or rales. Good inspiratory effort. . Abdomen: Soft nontender nondistended with normal bowel sounds x4 quadrants. . Musculoskeletal: No lower extremity edema. 2/4 pulses in all 4 extremities. . Skin: No ulcerative lesions noted or rashes, . Psychiatry: Unable to assess due to somnolence  Data Reviewed: CBC: Recent Labs  Lab 06/19/17 2313 06/20/17 0720  06/21/17 0322  WBC 8.3 7.7 6.7  NEUTROABS 5.4 4.6  --   HGB 18.7* 15.5 15.5  HCT 56.0* 48.1 47.5  MCV 92.9 93.4 92.6  PLT 146* 116* 892*   Basic Metabolic Panel: Recent Labs  Lab 06/19/17 2313 06/20/17 0720 06/21/17 0322  NA 149* 148* 149*  K 4.3 4.1 3.4*  CL 111 116* 114*  CO2 25 22 27   GLUCOSE 104* 84 92  BUN 26* 19 16   CREATININE 1.22 0.86 0.86  CALCIUM 10.0 8.6* 8.9   GFR: Estimated Creatinine Clearance: 75.8 mL/min (by C-G formula based on SCr of 0.86 mg/dL). Liver Function Tests: Recent Labs  Lab 06/19/17 2313 06/20/17 0720 06/21/17 0322  AST 37 28 31  ALT 23 18 17   ALKPHOS 83 67 63  BILITOT 2.9* 2.1* 2.2*  PROT 7.5 6.3* 6.3*  ALBUMIN 4.2 3.4* 3.2*   No results for input(s): LIPASE, AMYLASE in the last 168 hours. No results for input(s): AMMONIA in the last 168 hours. Coagulation Profile: Recent Labs  Lab 06/19/17 2313  INR 1.18   Cardiac Enzymes: Recent Labs  Lab 06/20/17 0720 06/21/17 0322  TROPONINI 0.04* 0.03*   BNP (last 3 results) No results for input(s): PROBNP in the last 8760 hours. HbA1C: No results for input(s): HGBA1C in the last 72 hours. CBG: Recent Labs  Lab 06/21/17 1204 06/21/17 1811 06/22/17 0101 06/22/17 0608 06/22/17 0702  GLUCAP 97 74 90 64* 147*   Lipid Profile: No results for input(s): CHOL, HDL, LDLCALC, TRIG, CHOLHDL, LDLDIRECT in the last 72 hours. Thyroid Function Tests: No results for input(s): TSH, T4TOTAL, FREET4, T3FREE, THYROIDAB in the last 72 hours. Anemia Panel: No results for input(s): VITAMINB12, FOLATE, FERRITIN, TIBC, IRON, RETICCTPCT in the last 72 hours. Urine analysis:    Component Value Date/Time   COLORURINE AMBER (A) 06/20/2017 0035   APPEARANCEUR CLEAR 06/20/2017 0035   LABSPEC 1.031 (H) 06/20/2017 0035   PHURINE 5.0 06/20/2017 0035   GLUCOSEU NEGATIVE 06/20/2017 0035   HGBUR SMALL (A) 06/20/2017 0035   BILIRUBINUR SMALL (A) 06/20/2017 0035   KETONESUR 20 (A) 06/20/2017 0035   PROTEINUR 100 (A) 06/20/2017 0035   NITRITE NEGATIVE 06/20/2017 0035   LEUKOCYTESUR NEGATIVE 06/20/2017 0035   Sepsis Labs: @LABRCNTIP (procalcitonin:4,lacticidven:4)  ) Recent Results (from the past 240 hour(s))  Culture, blood (Routine x 2)     Status: None (Preliminary result)   Collection Time: 06/19/17  1:22 AM  Result Value Ref  Range Status   Specimen Description BLOOD LEFT FOREARM  Final   Special Requests   Final    BOTTLES DRAWN AEROBIC AND ANAEROBIC Blood Culture adequate volume   Culture   Final    NO GROWTH 1 DAY Performed at [facility], [place] [address], [place], [place] [address]    Report Status PENDING  Incomplete  Culture, blood (Routine x 2)     Status: None (Preliminary result)   Collection Time: 06/19/17 11:30 PM  Result Value Ref Range Status   Specimen Description BLOOD LEFT WRIST  Final   Special Requests   Final    BOTTLES DRAWN AEROBIC ONLY Blood Culture results may not be optimal due to an inadequate volume of blood received in culture bottles   Culture   Final    NO GROWTH 1 DAY Performed at [facility], [place] [address] [place], [place] [address]    Report Status PENDING  Incomplete  Urine culture     Status: None  Collection Time: 06/20/17 12:35 AM  Result Value Ref Range Status   Specimen Description URINE, CATHETERIZED  Final   Special Requests NONE  Final   Culture   Final    NO GROWTH Performed at [facility], [address] [place], [place] [address]    Report Status 06/21/2017 FINAL  Final      Studies: No results found.  Scheduled Meds: . amantadine  100 mg Oral BID  . aspirin EC  81 mg Oral Daily  . carvedilol  25 mg Oral BID WC  . digoxin  0.0625 mg Oral Daily  . donepezil  5 mg Oral QHS  . ezetimibe  10 mg Oral QHS  . niacin  500 mg Oral QHS  . pantoprazole  40 mg Oral Daily  . sertraline  100 mg Oral Daily  . sucralfate  1 g Oral TID WC & HS    Continuous Infusions: . piperacillin-tazobactam (ZOSYN)  IV Stopped (06/22/17 0727)     LOS: 2 days     Kayleen Memos, MD [facility] Pager [phone]  If 7PM-7AM, please contact night-coverage [email or web address] Password [email or web address] 06/22/2017, 11:50 AM

## 2017-06-23 ENCOUNTER — Other Ambulatory Visit (HOSPITAL_COMMUNITY): Payer: Medicare Other

## 2017-06-23 DIAGNOSIS — I2510 Atherosclerotic heart disease of native coronary artery without angina pectoris: Secondary | ICD-10-CM

## 2017-06-23 DIAGNOSIS — I255 Ischemic cardiomyopathy: Secondary | ICD-10-CM

## 2017-06-23 DIAGNOSIS — Z95810 Presence of automatic (implantable) cardiac defibrillator: Secondary | ICD-10-CM

## 2017-06-23 DIAGNOSIS — I5022 Chronic systolic (congestive) heart failure: Secondary | ICD-10-CM

## 2017-06-23 LAB — GLUCOSE, CAPILLARY
GLUCOSE-CAPILLARY: 112 mg/dL — AB (ref 65–99)
GLUCOSE-CAPILLARY: 92 mg/dL (ref 65–99)
Glucose-Capillary: 70 mg/dL (ref 65–99)
Glucose-Capillary: 73 mg/dL (ref 65–99)

## 2017-06-23 LAB — BASIC METABOLIC PANEL
Anion gap: 15 (ref 5–15)
BUN: 12 mg/dL (ref 6–20)
CHLORIDE: 107 mmol/L (ref 101–111)
CO2: 23 mmol/L (ref 22–32)
CREATININE: 0.93 mg/dL (ref 0.61–1.24)
Calcium: 8.9 mg/dL (ref 8.9–10.3)
GFR calc Af Amer: >60 mL/min (ref >60)
GFR calc non Af Amer: >60 mL/min (ref >60)
Glucose, Bld: 77 mg/dL (ref 65–99)
Potassium: 3.4 mmol/L — ABNORMAL LOW (ref 3.5–5.1)
SODIUM: 145 mmol/L (ref 135–145)

## 2017-06-23 LAB — CBC
HEMATOCRIT: 50.6 % (ref 39.0–52.0)
HEMOGLOBIN: 16.9 g/dL (ref 13.0–17.0)
MCH: 29.7 pg (ref 26.0–34.0)
MCHC: 33.4 g/dL (ref 30.0–36.0)
MCV: 88.9 fL (ref 78.0–100.0)
Platelets: 132 10*3/uL — ABNORMAL LOW (ref 150–400)
RBC: 5.69 MIL/uL (ref 4.22–5.81)
RDW: 13.7 % (ref 11.5–15.5)
WBC: 7.1 10*3/uL (ref 4.0–10.5)

## 2017-06-23 LAB — MAGNESIUM: Magnesium: 1.9 mg/dL (ref 1.7–2.4)

## 2017-06-23 MED ORDER — POTASSIUM CHLORIDE 20 MEQ PO PACK
40.0000 meq | PACK | Freq: Two times a day (BID) | ORAL | Status: AC
  Administered 2017-06-23: 40 meq via ORAL
  Filled 2017-06-23 (×2): qty 2

## 2017-06-23 NOTE — Progress Notes (Signed)
Pharmacy Antibiotic Note  Ronnie Nelson is a 72 y.o. male admitted on 06/19/2017 with sepsis.  Pharmacy has been consulted for Vancomycin/Zosyn dosing. From home with altered mental status. WBC WNL. Renal function age appropriate.. Imaging shows enteritis. Afebrile. Renal function stable. Vancomycin d/c'd  Plan: Zosyn 3.375G IV q8h to be infused over 4 hours Trend WBC, temp, renal function   Temp (24hrs), Avg:98.7 F (37.1 C), Min:98.2 F (36.8 C), Max:99.3 F (37.4 C)  Recent Labs  Lab 06/19/17 2313 06/19/17 2342 06/20/17 0147 06/20/17 0720 06/21/17 0322 06/23/17 0826  WBC 8.3  --   --  7.7 6.7  --   CREATININE 1.22  --   --  0.86 0.86 0.93  LATICACIDVEN  --  3.08* 2.04* 1.3  --   --     Estimated Creatinine Clearance: 70.1 mL/min (by C-G formula based on SCr of 0.93 mg/dL).    No Known Allergies  Jannifer Fischler A. Levada Dy, PharmD, [place] Pager: [phone]  06/23/2017 10:23 AM

## 2017-06-23 NOTE — Progress Notes (Signed)
Informed by central tele that patient had a 6-beat run of v tach.  On assessment, patient is resting comfortably and asymptomatic.  Paged [facility] provider C. Bodenheimer, who issued no new orders.  Will continue to monitor.

## 2017-06-23 NOTE — Progress Notes (Signed)
  Speech Language Pathology Treatment: Dysphagia  Patient Details Name: Ronnie Nelson MRN: [id number] DOB: Nov 16, 1945 Today's Date: 06/23/2017 Time: 9242-6834 SLP Time Calculation (min) (ACUTE ONLY): 11 min  Assessment / Plan / Recommendation Clinical Impression  Pt awakens easily this afternoon and has improving initiation for communication and self-feeding. He continues to have quite prolonged mastication and tries to take large bites. Mild-Mod residue remains throughout his oral cavity. SLP provided Mod cues for multiple liquid washes and pureed boluses to facilitate oral clearance. He has no signs of aspiration, but his mentation still limits his ability to advance to more solid textures. Would continue with Dys 2 diet and thin liquids for now. Will continue to follow for possible advancement pending improving mentation.   HPI HPI: Ronnie Nelson is a 72 y.o. male with history of dementia, cardiomyopathy, CAD status post CABG, hypertension and diabetes mentioned in the chart but not on medication who was admitted last month for occipital stroke eventually discharged to rehab and back to home was brought to the ER after patient was found to be increasingly confused over the last 3 days.  Has not been eating well.  No nausea vomiting or diarrhea has not complained of any chest pain. Admitted with sepsis, source unclear, acute encephalopathy with recent stroke and history of dementia. CXR showed no acute pulmonary process. Head CT negative for acute findings.      SLP Plan  Continue with current plan of care       Recommendations  Diet recommendations: Dysphagia 2 (fine chop);Thin liquid Liquids provided via: Cup;Straw Medication Administration: Crushed with puree Supervision: Staff to assist with self feeding;Full supervision/cueing for compensatory strategies Compensations: Slow rate;Small sips/bites;Follow solids with liquid Postural Changes and/or Swallow Maneuvers: Seated upright 90  degrees                Oral Care Recommendations: Oral care BID Follow up Recommendations: (tba) SLP Visit Diagnosis: Dysphagia, oral phase (R13.11) Plan: Continue with current plan of care       GO                Germain Osgood 06/23/2017, 4:38 PM  Germain Osgood, M.A. CCC-SLP [phone]

## 2017-06-23 NOTE — Evaluation (Signed)
Physical Therapy Evaluation Patient Details Name: Ronnie Nelson MRN: [id number] DOB: October 09, 1945 Today's Date: 06/23/2017   History of Present Illness  72 yo male with onset of rcecent CVA and ventricular tachycardia was referred to PT after admission.  Pt has sepsis from unknown source, confusion, acute encephalopathy and dementia.  PMHx: CAD, CABG, ischemic cardiomyopathy, HTN, ICD, DM,   Clinical Impression  Pt was seen for evaluation of mobility and noted his generalized difficulty with standing and controlling standing.  Pt could not take a step in addition to partial standing and needs max assist to scoot up side of bed with bedpad.  Due to strong dense assistance needed will recommend SNF so that pt can recover as much as possible toward the goal of going home to stay with his daughter.  Follow acutely for same.    Follow Up Recommendations SNF    Equipment Recommendations  Rolling walker with 5" wheels    Recommendations for Other Services       Precautions / Restrictions Precautions Precautions: Fall Restrictions Weight Bearing Restrictions: No      Mobility  Bed Mobility Overal bed mobility: Needs Assistance Bed Mobility: Supine to Sit;Sit to Supine     Supine to sit: Mod assist Sit to supine: Mod assist   General bed mobility comments: dense cues for all mobility  Transfers Overall transfer level: Needs assistance Equipment used: Rolling walker (2 wheeled);1 person hand held assist Transfers: Sit to/from Stand Sit to Stand: Mod assist         General transfer comment: dense cues for hand placement and sequence  Ambulation/Gait             General Gait Details: pt could not take a step  Stairs            Wheelchair Mobility    Modified Rankin (Stroke Patients Only)       Balance Overall balance assessment: Needs assistance Sitting-balance support: Feet supported;Bilateral upper extremity supported Sitting balance-Leahy Scale: Fair      Standing balance support: Bilateral upper extremity supported;During functional activity Standing balance-Leahy Scale: Poor                               Pertinent Vitals/Pain Pain Assessment: No/denies pain    Home Living Family/patient expects to be discharged to:: Private residence Living Arrangements: Children Available Help at Discharge: Family;Available 24 hours/day Type of Home: House Home Access: Ramped entrance     Home Layout: One level Home Equipment: None      Prior Function Level of Independence: Needs assistance   Gait / Transfers Assistance Needed: walks with no AD  ADL's / Homemaking Assistance Needed: daughter is caring for house and cooking        Hand Dominance        Extremity/Trunk Assessment   Upper Extremity Assessment Upper Extremity Assessment: Generalized weakness    Lower Extremity Assessment Lower Extremity Assessment: Generalized weakness    Cervical / Trunk Assessment Cervical / Trunk Assessment: Normal  Communication   Communication: No difficulties  Cognition Arousal/Alertness: Lethargic Behavior During Therapy: Flat affect Overall Cognitive Status: History of cognitive impairments - at baseline                                        General Comments General comments (skin integrity, edema, etc.): pt  was assisted to stand on walker with attempts to step and then scoot but cannot scoot without bed pad and mod assist    Exercises     Assessment/Plan    PT Assessment Patient needs continued PT services  PT Problem List Decreased strength;Decreased range of motion;Decreased activity tolerance;Decreased balance;Decreased mobility;Decreased coordination;Decreased cognition;Decreased knowledge of use of DME;Decreased safety awareness;Decreased knowledge of precautions;Cardiopulmonary status limiting activity;Decreased skin integrity       PT Treatment Interventions DME instruction;Gait  training;Functional mobility training;Therapeutic activities;Balance training;Therapeutic exercise;Neuromuscular re-education;Patient/family education    PT Goals (Current goals can be found in the Care Plan section)  Acute Rehab PT Goals Patient Stated Goal: none stated PT Goal Formulation: With patient/family Time For Goal Achievement: 07/07/17 Potential to Achieve Goals: Fair    Frequency Min 2X/week   Barriers to discharge Other (comment)(requires 2 person assist with very reduced functional abilit)      Co-evaluation               AM-PAC PT "6 Clicks" Daily Activity  Outcome Measure Difficulty turning over in bed (including adjusting bedclothes, sheets and blankets)?: Unable Difficulty moving from lying on back to sitting on the side of the bed? : Unable Difficulty sitting down on and standing up from a chair with arms (e.g., wheelchair, bedside commode, etc,.)?: Unable Help needed moving to and from a bed to chair (including a wheelchair)?: A Lot Help needed walking in hospital room?: Total Help needed climbing 3-5 steps with a railing? : Total 6 Click Score: 7    End of Session Equipment Utilized During Treatment: Gait belt Activity Tolerance: Treatment limited secondary to medical complications (Comment)(generalized weakness) Patient left: in bed;with call bell/phone within reach;with bed alarm set;with family/visitor present Nurse Communication: Mobility status PT Visit Diagnosis: Unsteadiness on feet (R26.81);Muscle weakness (generalized) (M62.81);Difficulty in walking, not elsewhere classified (R26.2);Adult, failure to thrive (R62.7)    Time: 1400-1431 PT Time Calculation (min) (ACUTE ONLY): 31 min   Charges:   PT Evaluation $PT Eval Moderate Complexity: 1 Mod PT Treatments $Therapeutic Activity: 8-22 mins   PT G Codes:   PT G-Codes **NOT FOR INPATIENT CLASS** Functional Assessment Tool Used: AM-PAC 6 Clicks Basic Mobility    Ramond Dial 06/23/2017,  10:15 PM   Mee Hives, PT MS Acute Rehab Dept. Number: [place] [facility] [place]

## 2017-06-23 NOTE — Progress Notes (Signed)
Informed by central tele at 0220 that patient had a 6-beat run of non-sustained v-tach.  On assessment, patient is resting comfortably and asymptomatic.  Paged [facility] provider C. Bodenheimer, who issued no new orders.  Will continue to monitor.

## 2017-06-23 NOTE — Consult Note (Addendum)
Cardiology Consultation:   Patient ID: Ronnie Nelson; [id number]; 26-Jul-1945   Admit date: 06/19/2017 Date of Consult: 06/23/2017  Primary Care Provider: Coy Saunas, MD Primary Cardiologist:Dr. Croitoru   Patient Profile:   Ronnie Nelson is a 72 y.o. male with a hx of CAD status post CABG, ischemic cardiomyopathy status post ICD, diabetes, hypertension, hyperlipidemia, and recent CVA who is being seen today for the evaluation of nonsustained VT at the request of Dr. Nevada Crane.  He has a long-standing history of coronary artery disease and had bypass surgery in 1999. Has not required coronary angiography since the year 2000 when all his grafts were found to be patent. He does not have angina pectoris and a nuclear stress test performed many years ago did not show reversible ischemia, but did show very extensive scar in the distribution of all 3 major coronary arteries.   His first defibrillator that was implanted in 2009 and he had a generator change in January 2016. Therapy has not been delivered since 2011 when he received antitachycardia pacing.  Last echo in 2013 showed LV function of 40 to 45%.  Last seen by Dr. Sallyanne Kuster 01/14/17.  Device check showed episode of nonsustained ventricular tachycardia.  He was asymptomatic.  Admitted 4/3-4/10 at [facility]  for acute occipital stroke with schwannoma.  Initially presented with mental status. Went to SNF at [facility] 4/10-5/1.  History of Present Illness:   Ronnie Nelson to ER 06/20/17 with 3 days hx of AMS. Found to have temp of 102. CT of the head did not showed anything acute except for the occipital infarct and schwannoma which has been seen last month also. Admitted for sepsis for unknown source. ? Due to enteritis which was found on CT of abdomen and pelvis. Treated with fluids and Abx.  Patient noted to have 3 episodes of nonsustained VT (5-7 beats in the range).  He is asymptomatic.  Cardiology is asked for further treatment and plan.   Potassium is low at 3.4.  Denies chest pain, shortness of breath or lower extremity edema.   Past Medical History:  Diagnosis Date  . CAD (coronary artery disease)    a. CABG 1996: SVG to LAD,LIMA to CX,SVG to RCA  . DM (diabetes mellitus) ([place])   . HTN (hypertension)   . Hyperlipidemia   . ICD (implantable cardioverter-defibrillator) in place    a. Generator Medtronic Evera MRI [place] VR model [id number] serial number [id number]  . Ischemic cardiomyopathy    a.s/p ICD placement    Past Surgical History:  Procedure Laterality Date  . CARDIAC CATHETERIZATION  11/14/1998   severe depressed LV systolic function EF 56-81%  . CARDIAC DEFIBRILLATOR PLACEMENT  06/08/07   Medtronic  . CORONARY ARTERY BYPASS GRAFT  1996   [facility]  . IMPLANTABLE CARDIOVERTER DEFIBRILLATOR GENERATOR CHANGE N/A 05/09/2014   Procedure: IMPLANTABLE CARDIOVERTER DEFIBRILLATOR GENERATOR CHANGE;  Surgeon: Sanda Klein, MD;  Location: [place] CATH LAB;  Service: Cardiovascular;  Laterality: N/A;  . NM MYOCAR PERF WALL MOTION  11/21/2011   no significant ischemia     Inpatient Medications: Scheduled Meds: . amantadine  100 mg Oral BID  . aspirin EC  81 mg Oral Daily  . carvedilol  25 mg Oral BID WC  . digoxin  0.0625 mg Oral Daily  . donepezil  5 mg Oral QHS  . enoxaparin (LOVENOX) injection  40 mg Subcutaneous Q24H  . ezetimibe  10 mg Oral QHS  . mouth rinse  15 mL Mouth  Rinse BID  . niacin  500 mg Oral QHS  . pantoprazole  40 mg Oral Daily  . potassium chloride  40 mEq Oral Once  . sertraline  100 mg Oral Daily  . sucralfate  1 g Oral TID WC & HS   Continuous Infusions: . piperacillin-tazobactam (ZOSYN)  IV Stopped (06/23/17 1006)   PRN Meds: acetaminophen **OR** acetaminophen, hydrALAZINE, nitroGLYCERIN, ondansetron **OR** ondansetron (ZOFRAN) IV, risperiDONE  Allergies:   No Known Allergies  Social History:   Social History   Socioeconomic History  . Marital status: Married    Spouse name: Not  on file  . Number of children: Not on file  . Years of education: Not on file  . Highest education level: Not on file  Occupational History  . Not on file  Social Needs  . Financial resource strain: Not on file  . Food insecurity:    Worry: Not on file    Inability: Not on file  . Transportation needs:    Medical: Not on file    Non-medical: Not on file  Tobacco Use  . Smoking status: Former Smoker    Last attempt to quit: 02/10/1995    Years since quitting: 22.3  . Smokeless tobacco: Never Used  Substance and Sexual Activity  . Alcohol use: No  . Drug use: No  . Sexual activity: Not on file  Lifestyle  . Physical activity:    Days per week: Not on file    Minutes per session: Not on file  . Stress: Not on file  Relationships  . Social connections:    Talks on phone: Not on file    Gets together: Not on file    Attends religious service: Not on file    Active member of club or organization: Not on file    Attends meetings of clubs or organizations: Not on file    Relationship status: Not on file  . Intimate partner violence:    Fear of current or ex partner: Not on file    Emotionally abused: Not on file    Physically abused: Not on file    Forced sexual activity: Not on file  Other Topics Concern  . Not on file  Social History Narrative  . Not on file    Family History:   Family History  Family history unknown: Yes     ROS:  Please see the history of present illness.  All other ROS reviewed and negative.     Physical Exam/Data:   Vitals:   06/22/17 2145 06/23/17 0500 06/23/17 0606 06/23/17 0631  BP: (!) 148/96  (!) 152/107 (!) 138/92  Pulse: 84  83   Resp: 18  17   Temp: 99.3 F (37.4 C)  98.5 F (36.9 C)   TempSrc: Oral  Oral   SpO2: 98%  97%   Weight:  153 lb 7 oz (69.6 kg)      Intake/Output Summary (Last 24 hours) at 06/23/2017 1142 Last data filed at 06/23/2017 0956 Gross per 24 hour  Intake 350 ml  Output 300 ml  Net 50 ml   Filed  Weights   06/21/17 0500 06/22/17 0500 06/23/17 0500  Weight: 156 lb 4.9 oz (70.9 kg) 155 lb 13.8 oz (70.7 kg) 153 lb 7 oz (69.6 kg)   Body mass index is 24.03 kg/m.  General: ill appearing male , in no acute distress HEENT: normal Lymph: no adenopathy Neck: no JVD Endocrine:  No thryomegaly Vascular: No carotid bruits; FA  pulses 2+ bilaterally without bruits  Cardiac:  normal S1, S2; RRR; no murmur  Lungs:  clear to auscultation bilaterally, no wheezing, rhonchi or rales  Abd: soft, nontender, no hepatomegaly  Ext: no edema Musculoskeletal:  No deformities, BUE and BLE strength normal and equal Skin: warm and dry  Neuro:  demented Psych:  demented  EKG:  The EKG was personally reviewed and demonstrates: Sinus rhythm with bifascicular block Telemetry:  Telemetry was personally reviewed and demonstrates: Sinus rhythm with nonsustained VT and PVCs  Relevant CV Studies:  Echo 05/14/17  Dilated left ventricle - moderate to severe  Severely decreased left ventricular systolic function, ejection fraction  20 to 19%  Diastolic dysfunction - grade III (severely elevated filling pressures)  Mitral regurgitation - mild to moderate  Dilated left atrium - mild to moderate  Aortic sclerosis  Normal right ventricular systolic function  Elevated pulmonary artery systolic pressure - mild  Mildly elevated right atrial pressure  Tricuspid regurgitation - mild  Possible patent foramen ovale  No evidence of an interatrial communication  Laboratory Data:  Chemistry Recent Labs  Lab 06/20/17 0720 06/21/17 0322 06/23/17 0826  NA 148* 149* 145  K 4.1 3.4* 3.4*  CL 116* 114* 107  CO2 22 27 23   GLUCOSE 84 92 77  BUN 19 16 12   CREATININE 0.86 0.86 0.93  CALCIUM 8.6* 8.9 8.9  GFRNONAA >60 >60 >60  GFRAA >60 >60 >60  ANIONGAP 10 8 15     Recent Labs  Lab 06/19/17 2313 06/20/17 0720 06/21/17 0322  PROT 7.5 6.3* 6.3*  ALBUMIN 4.2 3.4* 3.2*  AST 37 28 31  ALT 23 18 17     ALKPHOS 83 67 63  BILITOT 2.9* 2.1* 2.2*   Hematology Recent Labs  Lab 06/20/17 0720 06/21/17 0322 06/23/17 0826  WBC 7.7 6.7 7.1  RBC 5.15 5.13 5.69  HGB 15.5 15.5 16.9  HCT 48.1 47.5 50.6  MCV 93.4 92.6 88.9  MCH 30.1 30.2 29.7  MCHC 32.2 32.6 33.4  RDW 14.4 14.1 13.7  PLT 116* 122* 132*   Cardiac Enzymes Recent Labs  Lab 06/20/17 0720 06/21/17 0322  TROPONINI 0.04* 0.03*    Recent Labs  Lab 06/19/17 2341  TROPIPOC 0.01   Radiology/Studies:  Ct Head Wo Contrast  Result Date: 06/20/2017 CLINICAL DATA:  72 y/o M; 3 days of altered mental status, weakness, inability to walk, not eating. EXAM: CT HEAD WITHOUT CONTRAST TECHNIQUE: Contiguous axial images were obtained from the base of the skull through the vertex without intravenous contrast. COMPARISON:  06/09/2017 CT head.  05/16/2017 CT angiogram head. FINDINGS: Brain: No evidence of acute infarction, hemorrhage, focal mass effect, or extra-axial collection. Stable chronic microvascular ischemic changes and parenchymal volume loss of brain. Stable lateral and third ventriculomegaly. Small chronic infarct in the left occipital lobe. 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 Vascular: Calcific atherosclerosis of carotid siphons. No hyperdense vessel. Skull: Normal. Negative for fracture or focal lesion. Sinuses/Orbits: No acute finding. Other: None. IMPRESSION: 1. No acute intracranial abnormality identified. 2. Stable small chronic infarct in left occipital lobe, chronic microvascular ischemic changes of the brain, and parenchymal volume loss. 3. 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 4. 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 Electronically Signed   By: Kristine Garbe M.D.   On: 06/20/2017 00:35   Ct Abdomen  Pelvis W Contrast  Result Date: 06/20/2017 CLINICAL DATA:  72 year old male with fever of unknown origin. Abdominal pain. EXAM: CT ABDOMEN AND PELVIS WITH CONTRAST TECHNIQUE: Multidetector CT imaging of the abdomen and pelvis was performed using the standard protocol following bolus administration of intravenous contrast. CONTRAST:  185mL OMNIPAQUE IOHEXOL 300 MG/ML  SOLN COMPARISON:  Abdominal CT dated 04/30/2017 FINDINGS: Evaluation is limited due to streak artifact caused by patient's arms and cardiac AICD wire. Lower chest: Minimal bibasilar atelectatic changes. Slight interlobular septal prominence may represent a degree of congestion. There is moderate cardiomegaly. Multi vessel coronary vascular calcification and partially visualized cardiac AICD device. No intra-abdominal free air or free fluid. Hepatobiliary: Probable mild fatty infiltration of the liver. No intrahepatic biliary ductal dilatation. Cholecystectomy. Pancreas: Unremarkable. No pancreatic ductal dilatation or surrounding inflammatory changes. Spleen: Normal in size without focal abnormality. Adrenals/Urinary Tract: The adrenal glands are unremarkable as visualized. Areas of old infarct and scarring noted in the interpolar aspect of the right kidney as well as inferior pole of the left kidney likely sequela of chronic infection. There is no hydronephrosis on either side. There is symmetric enhancement and excretion of contrast by both kidneys. The visualized ureters appear unremarkable. There is a 2 cm right bladder diverticulum. Mild diffuse thickened appearance of the bladder wall, likely related to underdistention. Correlation with urinalysis recommended to exclude UTI. Stomach/Bowel: There is mild haziness of the mesentery adjacent to a loop of bowel in pelvis (series 3, image 62) findings may represent mild enteritis. Clinical correlation is recommended. There is no bowel obstruction. Loose stool noted in the proximal colon. Formed stool  is seen in the distal colon. The appendix is normal. Vascular/Lymphatic: There is advanced aortoiliac atherosclerotic disease. 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 No portal venous gas. There is no adenopathy. Reproductive: The prostate and seminal vesicles are grossly unremarkable. Other: Mild diffuse subcutaneous edema. Musculoskeletal: Degenerative changes of the spine primarily at L5-S1. No acute osseous pathology. IMPRESSION: 1. Mild haziness of the mesentery may be related to diffuse edema or represent mild enteritis. Clinical correlation is recommended. No bowel obstruction. Normal appendix. 2. Fatty liver. 3. Areas of old infarct and scarring in the kidneys. No hydronephrosis. Correlation with urinalysis recommended to exclude UTI. 4. 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 Recommend followup by ultrasound in 5 years. This recommendation follows ACR consensus guidelines: White Paper of the ACR Incidental Findings Committee II on Vascular Findings. J Am Coll Radiol 2013; 10:789-794. 5. Advanced Aortic Atherosclerosis (ICD10-I70.0). Electronically Signed   By: Anner Crete M.D.   On: 06/20/2017 03:14   Dg Chest Portable 1 View  Result Date: 06/20/2017 CLINICAL DATA:  72 y/o M; 3 days of altered mental status, weakness, inability to walk, not eating. EXAM: PORTABLE CHEST 1 VIEW COMPARISON:  05/18/2017 chest radiograph FINDINGS: Stable cardiomegaly post median sternotomy and CABG given differences in technique. Two lead AICD. Aortic atherosclerosis with calcification. Clear lungs. No pleural effusion or pneumothorax. No acute osseous abnormality is evident. IMPRESSION: Stable cardiomegaly.  No acute pulmonary process identified. Electronically Signed   By: Kristine Garbe M.D.   On: 06/20/2017 00:13    Assessment and Plan:   1.  NSVT - 3 episodes noted. Each in 5-7 beats in range. No sustained tachycardia. Asymptomatic. Likely  due to dilutional hypokalemia with K of 3.4. He can not swallow tablet. Will supplement by liquid.  - Pending echo this admission.  -  Keep  K around 4. Check MG, keep around 2.  - He did have prior asymptomatic NSVT by device check.  2. ICM s/p ICM - Echocardiogram 05/14/17 at outside hospital showed severely dilated left ventricle with EF of 20 to 54%, grade 3 diastolic dysfunction, mild to moderate mitral regurgitation, mild to moderate dilated left atrium.  - Pending echo this admission. He does not need echo this admission. His LVEF was 40-45% in 01/2012. - Euvolemic by exam. Continue coreg 25mg  BID and digoxin. Home ramipril and lasix on hold (resume when able).  - Consider entresto/spironolactone as outpatient.   3. CAD s/p CABG - No chest pain. Continue ASA.   4. AMS/Recent CVA - Per primary team   5. Hypokalemia - As above  For questions or updates, please contact [place] Please consult [email or web address] for contact info under Cardiology/STEMI.   Jarrett Soho, [place]  06/23/2017 11:42 AM   Personally seen and examined. Agree with above.  72 year old with defibrillator in place from ischemic cardiomyopathy with ejection fraction between 20 and 25% with 5-7 beats of nonsustained VT seen on telemetry.  Asymptomatic.  Recent stroke.  Currently in bed, somewhat confused.  GEN: Well nourished, well developed, in no acute distress mittens on HEENT: normal  Neck: no JVD, carotid bruits, or masses Cardiac: RRR; no murmurs, rubs, or gallops,no edema  Respiratory:  clear to auscultation bilaterally, normal work of breathing GI: soft, nontender, nondistended, + BS MS: no deformity or atrophy  Skin: warm and dry, no rash Neuro: Confusion Psych: Confusion  Telemetry-personally reviewed shows brief nonsustained VT episodes as well as PVCs, 5-7 beats.  ECG-sinus rhythm bifascicular block personally viewed.  Lab work-hypokalemia noted.  ICD check as outpatient-  occasional runs of nonsustained VT noted.  Assessment and plan:  Nonsustained VT - To be expected with his underlying cardiomyopathy.  This is been a long-standing issue for him.  He has ICD for back-up if necessary.  No recent defibrillations.  Continue with current management with beta-blocker and continue to keep potassium hopefully greater than 4.  We will supplement K with liquid.  Ischemic cardia myopathy EF 20 to 25% with ICD in place -Continue with current aggressive secondary medical prevention. -No need for repeat echocardiogram. -At some point, consider Entresto, perhaps as outpatient -Digoxin reasonable. - ACE inhibitor currently on hold  CAD post CABG -Continue with aspirin no anginal symptoms  CVA -Per primary team.  We will sign off.  Please let us know if he can be further assistance. Candee Furbish, MD

## 2017-06-23 NOTE — Progress Notes (Addendum)
PROGRESS NOTE  Ronnie Nelson EHU:[id number] DOB: 01-23-1946 DOA: 06/19/2017 PCP: Coy Saunas, MD  HPI/Recap of past 75 hours: 72 year old male with medical history significant for but not limited to dementia and recent CVA presents with acute confusion and fever of 102.9, and noted to be dehydrated with acute kidney injury on arrival to the ER.  Patient admitted for sepsis of unknown source.  06/22/2017: Patient seen and examined at his bedside.  He is somnolent but arousable to voices.  He has no new complaints.  CT abdomen and pelvis with contrast on 06/20/2017 revealed mild enteritis.  DC vancomycin and continue Zosyn.  06/23/17: No new complaints. 6 beats nonsustained VTach reported overnight. Cardiology consulted.   Assessment/Plan: Principal Problem:   Sepsis ([place]) Active Problems:   DM2 (diabetes mellitus, type 2) (HCC)   HTN (hypertension)   CAD (coronary artery disease)   Cardiomyopathy, ischemic   Chronic systolic CHF (congestive heart failure) (HCC)   ICD (implantable cardioverter-defibrillator) in place   ICD (implantable cardioverter-defibrillator) battery depletion   ICD (implantable cardioverter-defibrillator) lead failure   Acute encephalopathy  Sepsis secondary to enteritis, improving Urine analysis and urine culture negative, blood cultures x2- to date, chest x-ray personally reviewed unremarkable for any lobular infiltrates. Continue IV Zosyn Discontinue vancomycin Monitor fever curve  Acute metabolic encephalopathy most likely secondary to sepsis, improving reorient as needed History of dementia with recent stroke No MRI done due to AICD on left side of chest  Nonsustained VTach Cardiology consulted Recommended continue medical management AICD in place  Hypovolemic Hypernatremia Na+ level is improving from 149 to 145 Continue to encourage po fluid intake to avoid dehydration  Dehydration most likely secondary to poor oral intake, improving Speech  therapy evaluated and recommended dysphagia 2 diet with fine chopped and thin liquid Aspiration precaution Encourage increase in oral fluid intake  Hypokalemia Potassium 3.4 Repleted with KCl 40 mEq once And IV mag 2 grams  Hyperbilirubinemia Unclear etiology Total bili 2.2 w normal ast, alt and alk phos  Moderate protein calorie malnutrition Albumin 3.2 Encourage increase oral intake  Physical debility PT to assess  Chronic systolic CHF/post AICD placement Last 2D echo done on 02/10/2012 revealed LVEF 40 to 45% Caution with IV fluid Continue strict I's and O's, daily weight    Code Status: Full  Family Communication: None at bedside  Disposition Plan: SNF/home when clinically stable   Consultants: None  Procedures:  None  Antimicrobials:  IV Zosyn  DVT prophylaxis: Subcu Lovenox   Objective: Vitals:   06/23/17 0500 06/23/17 0606 06/23/17 0631 06/23/17 1450  BP:  (!) 152/107 (!) 138/92 131/88  Pulse:  83  78  Resp:  17  16  Temp:  98.5 F (36.9 C)  100.2 F (37.9 C)  TempSrc:  Oral  Oral  SpO2:  97%  97%  Weight: 69.6 kg (153 lb 7 oz)       Intake/Output Summary (Last 24 hours) at 06/23/2017 1611 Last data filed at 06/23/2017 0956 Gross per 24 hour  Intake 50 ml  Output -  Net 50 ml   Filed Weights   06/21/17 0500 06/22/17 0500 06/23/17 0500  Weight: 70.9 kg (156 lb 4.9 oz) 70.7 kg (155 lb 13.8 oz) 69.6 kg (153 lb 7 oz)    Exam:  . General: 72 y.o. year-old male WD WN NAD A&O in the setting of advanced dementia. . Cardiovascular: RRR No rubs or gallops. No JVD or thyromegaly. Marland Kitchen Respiratory: Clear to auscultation with  no wheezes or rales. Good inspiratory effort. . Abdomen: Soft nontender nondistended with normal bowel sounds x4 quadrants. . Musculoskeletal: No lower extremity edema. 2/4 pulses in all 4 extremities. . Skin: No ulcerative lesions noted or rashes, . Psychiatry: Unable to assess due to somnolence  Data  Reviewed: CBC: Recent Labs  Lab 06/19/17 2313 06/20/17 0720 06/21/17 0322 06/23/17 0826  WBC 8.3 7.7 6.7 7.1  NEUTROABS 5.4 4.6  --   --   HGB 18.7* 15.5 15.5 16.9  HCT 56.0* 48.1 47.5 50.6  MCV 92.9 93.4 92.6 88.9  PLT 146* 116* 122* 341*   Basic Metabolic Panel: Recent Labs  Lab 06/19/17 2313 06/20/17 0720 06/21/17 0322 06/23/17 0826  NA 149* 148* 149* 145  K 4.3 4.1 3.4* 3.4*  CL 111 116* 114* 107  CO2 '25 22 27 23  ' GLUCOSE 104* 84 92 77  BUN 26* '19 16 12  ' CREATININE 1.22 0.86 0.86 0.93  CALCIUM 10.0 8.6* 8.9 8.9  MG  --   --   --  1.9   GFR: Estimated Creatinine Clearance: 70.1 mL/min (by C-G formula based on SCr of 0.93 mg/dL). Liver Function Tests: Recent Labs  Lab 06/19/17 2313 06/20/17 0720 06/21/17 0322  AST 37 28 31  ALT '23 18 17  ' ALKPHOS 83 67 63  BILITOT 2.9* 2.1* 2.2*  PROT 7.5 6.3* 6.3*  ALBUMIN 4.2 3.4* 3.2*   No results for input(s): LIPASE, AMYLASE in the last 168 hours. No results for input(s): AMMONIA in the last 168 hours. Coagulation Profile: Recent Labs  Lab 06/19/17 2313  INR 1.18   Cardiac Enzymes: Recent Labs  Lab 06/20/17 0720 06/21/17 0322  TROPONINI 0.04* 0.03*   BNP (last 3 results) No results for input(s): PROBNP in the last 8760 hours. HbA1C: No results for input(s): HGBA1C in the last 72 hours. CBG: Recent Labs  Lab 06/22/17 1508 06/22/17 1827 06/22/17 2350 06/23/17 0604 06/23/17 1227  GLUCAP 73 77 73 70 112*   Lipid Profile: No results for input(s): CHOL, HDL, LDLCALC, TRIG, CHOLHDL, LDLDIRECT in the last 72 hours. Thyroid Function Tests: No results for input(s): TSH, T4TOTAL, FREET4, T3FREE, THYROIDAB in the last 72 hours. Anemia Panel: No results for input(s): VITAMINB12, FOLATE, FERRITIN, TIBC, IRON, RETICCTPCT in the last 72 hours. Urine analysis:    Component Value Date/Time   COLORURINE AMBER (A) 06/20/2017 0035   APPEARANCEUR CLEAR 06/20/2017 0035   LABSPEC 1.031 (H) 06/20/2017 0035    PHURINE 5.0 06/20/2017 0035   GLUCOSEU NEGATIVE 06/20/2017 0035   HGBUR SMALL (A) 06/20/2017 0035   BILIRUBINUR SMALL (A) 06/20/2017 0035   KETONESUR 20 (A) 06/20/2017 0035   PROTEINUR 100 (A) 06/20/2017 0035   NITRITE NEGATIVE 06/20/2017 0035   LEUKOCYTESUR NEGATIVE 06/20/2017 0035   Sepsis Labs: '@LABRCNTIP' (procalcitonin:4,lacticidven:4)  ) Recent Results (from the past 240 hour(s))  Culture, blood (Routine x 2)     Status: None (Preliminary result)   Collection Time: 06/19/17  1:22 AM  Result Value Ref Range Status   Specimen Description BLOOD LEFT FOREARM  Final   Special Requests   Final    BOTTLES DRAWN AEROBIC AND ANAEROBIC Blood Culture adequate volume   Culture   Final    NO GROWTH 3 DAYS Performed at [facility], [place] [address], [place], [place] [address]    Report Status PENDING  Incomplete  Culture, blood (Routine x 2)     Status: None (Preliminary result)   Collection Time: 06/19/17 11:30 PM  Result  Value Ref Range Status   Specimen Description BLOOD LEFT WRIST  Final   Special Requests   Final    BOTTLES DRAWN AEROBIC ONLY Blood Culture results may not be optimal due to an inadequate volume of blood received in culture bottles   Culture   Final    NO GROWTH 3 DAYS Performed at [facility], [place] [address], [place], [place] [address]    Report Status PENDING  Incomplete  Urine culture     Status: None   Collection Time: 06/20/17 12:35 AM  Result Value Ref Range Status   Specimen Description URINE, CATHETERIZED  Final   Special Requests NONE  Final   Culture   Final    NO GROWTH Performed at [facility], [place] [address] [place], [place] [address]    Report Status 06/21/2017 FINAL  Final      Studies: No results found.  Scheduled Meds: . amantadine  100 mg Oral BID  . aspirin EC  81 mg Oral Daily  . carvedilol  25 mg Oral BID WC  . digoxin  0.0625 mg Oral Daily  . donepezil  5 mg Oral QHS  . enoxaparin (LOVENOX) injection  40 mg  Subcutaneous Q24H  . ezetimibe  10 mg Oral QHS  . mouth rinse  15 mL Mouth Rinse BID  . niacin  500 mg Oral QHS  . pantoprazole  40 mg Oral Daily  . potassium chloride  40 mEq Oral BID  . sertraline  100 mg Oral Daily  . sucralfate  1 g Oral TID WC & HS    Continuous Infusions: . piperacillin-tazobactam (ZOSYN)  IV 3.375 g (06/23/17 1353)     LOS: 3 days     Kayleen Memos, MD [facility] Pager [phone]  If 7PM-7AM, please contact night-coverage [email or web address] Password [email or web address] 06/23/2017, 4:11 PM

## 2017-06-24 LAB — BASIC METABOLIC PANEL
ANION GAP: 11 (ref 5–15)
BUN: 13 mg/dL (ref 6–20)
CHLORIDE: 106 mmol/L (ref 101–111)
CO2: 26 mmol/L (ref 22–32)
Calcium: 8.9 mg/dL (ref 8.9–10.3)
Creatinine, Ser: 0.89 mg/dL (ref 0.61–1.24)
GFR calc Af Amer: >60 mL/min (ref >60)
GFR calc non Af Amer: >60 mL/min (ref >60)
GLUCOSE: 86 mg/dL (ref 65–99)
Potassium: 3.3 mmol/L — ABNORMAL LOW (ref 3.5–5.1)
Sodium: 143 mmol/L (ref 135–145)

## 2017-06-24 LAB — GLUCOSE, CAPILLARY
GLUCOSE-CAPILLARY: 73 mg/dL (ref 65–99)
Glucose-Capillary: 75 mg/dL (ref 65–99)
Glucose-Capillary: 87 mg/dL (ref 65–99)
Glucose-Capillary: 89 mg/dL (ref 65–99)

## 2017-06-24 LAB — MAGNESIUM: Magnesium: 1.8 mg/dL (ref 1.7–2.4)

## 2017-06-24 MED ORDER — POTASSIUM CHLORIDE CRYS ER 20 MEQ PO TBCR
40.0000 meq | EXTENDED_RELEASE_TABLET | Freq: Once | ORAL | Status: AC
  Administered 2017-06-24: 40 meq via ORAL
  Filled 2017-06-24: qty 2

## 2017-06-24 MED ORDER — AMOXICILLIN-POT CLAVULANATE 875-125 MG PO TABS: 1.0000 | ORAL_TABLET | Freq: Two times a day (BID) | ORAL | 0 refills | Status: DC

## 2017-06-24 MED ORDER — ACETAMINOPHEN 325 MG PO TABS: 650.0000 mg | ORAL_TABLET | Freq: Four times a day (QID) | ORAL | Status: AC | PRN

## 2017-06-24 MED ORDER — AMOXICILLIN-POT CLAVULANATE 875-125 MG PO TABS
1.0000 | ORAL_TABLET | Freq: Two times a day (BID) | ORAL | Status: DC
  Administered 2017-06-24: 1 via ORAL
  Filled 2017-06-24: qty 1

## 2017-06-24 NOTE — Care Management Important Message (Signed)
Important Message  Patient Details  Name: [name] MRN: [id number] Date of Birth: [birth year]   Medicare Important Message Given:  Yes    Orbie Pyo 06/24/2017, 1:04 PM

## 2017-06-24 NOTE — Discharge Summary (Signed)
Physician Discharge Summary  Ronnie Nelson JOA:[id number] DOB: 07-27-1945 DOA: 06/19/2017  PCP: Coy Saunas, MD  Admit date: 06/19/2017 Discharge date: 06/24/2017  Time spent: 35 minutes  Recommendations for Outpatient Follow-up:  1. PCP in 1 week   Discharge Diagnoses:  Principal Problem:   Sepsis ([place]) Active Problems:   DM2 (diabetes mellitus, type 2) ([place])   HTN (hypertension)   CAD (coronary artery disease)   Cardiomyopathy, ischemic   Chronic systolic CHF (congestive heart failure) (HCC)   ICD (implantable cardioverter-defibrillator) in place   ICD (implantable cardioverter-defibrillator) battery depletion   ICD (implantable cardioverter-defibrillator) lead failure   Acute encephalopathy   Discharge Condition: improve  Diet recommendation: heart healthy, DM  Filed Weights   06/22/17 0500 06/23/17 0500 06/24/17 0500  Weight: 70.7 kg (155 lb 13.8 oz) 69.6 kg (153 lb 7 oz) 70.7 kg (155 lb 13.8 oz)    History of present illness:  72 year old male with medical history significant for but not limited to dementia and recent CVA presents with acute confusion and fever of 102.9, and noted to be dehydrated with acute kidney injury on arrival to the ER.  Hospital Course:   Sepsis/FEVER -secondary to enteritis likely -admitted with fever, weakness and increased confusion  -Urine analysis and urine culture negative, blood cultures x2- to date, chest x-ray personally reviewed unremarkable for any lobular infiltrates. -he was treated by my partner with broad spectrum Abx for 3days and then Abx changed to Zosyn  CT Abd was done which showed mild possible concern for enteritis, improved clinically and changed to PO Augmentin for few more days to complete 7day course. -no abd symptoms and tolerating diet without problems, no further fevers  Acute metabolic encephalopathy most likely secondary to sepsis, improving -History of dementia with recent stroke -mentation back to  baseline dementia -Speech therapy evaluated and recommended dysphagia 2 diet with fine chopped and thin liquid Aspiration precaution  Nonsustained VTach Cardiology consulted Recommended continue medical management, continue BB AICD in place  Dehydration - most likely secondary to poor oral intake, increased metabolic needs with fever -improved  Hypokalemia -repleted  Moderate protein calorie malnutrition Albumin 3.2 Encourage increase oral intake  Chronic systolic CHF/post AICD placement Last 2D echo done on 02/10/2012 revealed LVEF 40 to 45% -stable, euvolemic now, was dry on admission -continued on Coreg and lasix       Discharge Exam: Vitals:   06/24/17 0604 06/24/17 1253  BP: 133/87 124/88  Pulse: 71 75  Resp: 18 18  Temp: 98.7 F (37.1 C) 98.7 F (37.1 C)  SpO2: 96% 98%    General: AAOx2 Cardiovascular: S1S2/RRR Respiratory: improved air movement, no ronchi  Discharge Instructions   Discharge Instructions    Diet - low sodium heart healthy   Complete by:  As directed    Increase activity slowly   Complete by:  As directed      Allergies as of 06/24/2017   No Known Allergies     Medication List    TAKE these medications   acetaminophen 325 MG tablet Commonly known as:  TYLENOL Take 2 tablets (650 mg total) by mouth every 6 (six) hours as needed for mild pain (or Fever >/= 101).   amantadine 100 MG capsule Commonly known as:  SYMMETREL Take 100 mg by mouth 2 (two) times daily.   amoxicillin-clavulanate 875-125 MG tablet Commonly known as:  AUGMENTIN Take 1 tablet by mouth every 12 (twelve) hours. For 2days   aspirin EC 81 MG tablet Take  81 mg by mouth daily.   carvedilol 25 MG tablet Commonly known as:  COREG TAKE 1 TABLET BY MOUTH TWICE A DAY WITH A MEAL   digoxin 0.125 MG tablet Commonly known as:  LANOXIN Take 0.0625 mg by mouth daily.   donepezil 5 MG tablet Commonly known as:  ARICEPT Take 5 mg by mouth at  bedtime.   ezetimibe 10 MG tablet Commonly known as:  ZETIA Take 10 mg by mouth at bedtime.   furosemide 20 MG tablet Commonly known as:  LASIX Take 20 mg by mouth daily.   niacin 500 MG CR tablet Commonly known as:  NIASPAN Take 500 mg by mouth at bedtime.   nitroGLYCERIN 0.4 MG SL tablet Commonly known as:  NITROSTAT Place 0.4 mg under the tongue every 5 (five) minutes as needed for chest pain.   omeprazole 20 MG capsule Commonly known as:  PRILOSEC Take 20 mg by mouth 2 (two) times daily.   ramipril 10 MG capsule Commonly known as:  ALTACE Take 10 mg by mouth daily.   risperiDONE 0.25 MG tablet Commonly known as:  RISPERDAL Take 0.25 mg by mouth 2 (two) times daily as needed (agitation).   sertraline 100 MG tablet Commonly known as:  ZOLOFT Take 100 mg by mouth daily.   sucralfate 1 g tablet Commonly known as:  CARAFATE Take 1 g by mouth 4 (four) times daily -  with meals and at bedtime.            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  A cane will not resolve  issue with performing activities of daily living. A wheelchair will allow patient to safely perform daily activities. Patient can safely propel the wheelchair in the home or has a caregiver who can provide assistance.  Accessories: elevating leg rests (ELRs), wheel locks, extensions and anti-tippers.   06/24/17 1538     No Known Allergies Follow-up Information    Coy Saunas, MD. Schedule an appointment as soon as possible for a visit in 1 week(s).   Specialty:  Family Medicine Contact information: [address]  [place] [address] [place] Follow up.    Specialty:  Home Health Services Why:  home health services arranged Contact information: [address] [place] [address] [phone]            The results of significant diagnostics from this hospitalization (including imaging, microbiology, ancillary and laboratory) are listed below for reference.    Significant Diagnostic Studies: Ct Head Wo Contrast  Result Date: 06/20/2017 CLINICAL DATA:  72 y/o M; 3 days of altered mental status, weakness, inability to walk, not eating. EXAM: CT HEAD WITHOUT CONTRAST TECHNIQUE: Contiguous axial images were obtained from the base of the skull through the vertex without intravenous contrast. COMPARISON:  06/09/2017 CT head.  05/16/2017 CT angiogram head. FINDINGS: Brain: No evidence of acute infarction, hemorrhage, focal mass effect, or extra-axial collection. Stable chronic microvascular ischemic changes and parenchymal volume loss of brain. Stable lateral and third ventriculomegaly. Small chronic infarct in the left occipital lobe. 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 Vascular: Calcific atherosclerosis of carotid siphons. No hyperdense vessel. Skull: Normal. Negative for fracture or focal lesion. Sinuses/Orbits: No acute finding. Other: None. IMPRESSION: 1. No acute intracranial abnormality identified. 2. Stable small chronic infarct in left occipital lobe, chronic microvascular ischemic changes of the brain, and parenchymal volume loss. 3. 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 4. 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 Electronically Signed   By: Kristine Garbe M.D.   On: 06/20/2017 00:35   Ct Abdomen Pelvis W Contrast  Result Date: 06/20/2017 CLINICAL DATA:  72 year old male with fever of unknown origin. Abdominal  pain. EXAM: CT ABDOMEN AND PELVIS WITH CONTRAST TECHNIQUE: Multidetector CT imaging of the abdomen and pelvis was performed using the standard protocol following bolus administration of intravenous contrast. CONTRAST:  138mL OMNIPAQUE IOHEXOL 300 MG/ML  SOLN COMPARISON:  Abdominal CT dated 04/30/2017 FINDINGS: Evaluation is limited due to streak artifact caused by patient's arms and cardiac AICD wire. Lower chest: Minimal bibasilar atelectatic changes. Slight interlobular septal prominence may represent a degree of congestion. There is moderate cardiomegaly. Multi vessel coronary vascular calcification and partially visualized cardiac AICD device. No intra-abdominal free air or free fluid. Hepatobiliary: Probable mild fatty infiltration of the liver. No intrahepatic biliary ductal dilatation. Cholecystectomy. Pancreas: Unremarkable. No pancreatic ductal dilatation or surrounding inflammatory changes. Spleen: Normal in size without focal abnormality. Adrenals/Urinary Tract: The adrenal glands are unremarkable as visualized. Areas of old infarct and scarring noted in the interpolar aspect of the right kidney as well as inferior pole of the left kidney likely sequela of chronic infection. There is no hydronephrosis on either side. There is symmetric enhancement and excretion of contrast by both kidneys. The visualized ureters appear unremarkable. There is a 2 cm right bladder diverticulum. Mild diffuse thickened appearance of the bladder wall, likely related to underdistention. Correlation with urinalysis recommended to exclude UTI. Stomach/Bowel: There is mild haziness of the mesentery adjacent to a loop of bowel in pelvis (series 3, image 62) findings may represent mild enteritis. Clinical correlation is recommended. There is no bowel obstruction. Loose stool noted in the proximal colon. Formed stool is seen in the distal colon. The appendix is normal. Vascular/Lymphatic: There is advanced aortoiliac atherosclerotic  disease. 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 No portal venous gas. There is no adenopathy. Reproductive: The prostate and seminal vesicles are grossly unremarkable. Other: Mild diffuse subcutaneous edema. Musculoskeletal: Degenerative changes of the spine primarily at L5-S1. No acute osseous pathology. IMPRESSION: 1. Mild haziness of the mesentery may be related to diffuse edema or represent mild enteritis. Clinical correlation is recommended. No bowel obstruction. Normal appendix. 2. Fatty liver. 3. Areas of old infarct and scarring in the kidneys. No hydronephrosis. Correlation with urinalysis recommended to exclude UTI. 4. 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 Recommend followup by ultrasound in 5 years. This recommendation follows ACR consensus guidelines: White Paper of the ACR Incidental Findings Committee II on Vascular Findings. J Am Coll Radiol 2013; 10:789-794. 5. Advanced Aortic Atherosclerosis (ICD10-I70.0). Electronically Signed   By: Anner Crete M.D.   On: 06/20/2017 03:14   Dg Chest Portable 1 View  Result Date: 06/20/2017 CLINICAL DATA:  71 y/o M; 3 days of altered mental status, weakness, inability to walk, not eating. EXAM: PORTABLE CHEST 1 VIEW  COMPARISON:  05/18/2017 chest radiograph FINDINGS: Stable cardiomegaly post median sternotomy and CABG given differences in technique. Two lead AICD. Aortic atherosclerosis with calcification. Clear lungs. No pleural effusion or pneumothorax. No acute osseous abnormality is evident. IMPRESSION: Stable cardiomegaly.  No acute pulmonary process identified. Electronically Signed   By: Kristine Garbe M.D.   On: 06/20/2017 00:13    Microbiology: Recent Results (from the past 240 hour(s))  Culture, blood (Routine x 2)     Status: None (Preliminary result)   Collection Time: 06/19/17  1:22 AM  Result Value Ref Range Status   Specimen Description BLOOD LEFT FOREARM  Final    Special Requests   Final    BOTTLES DRAWN AEROBIC AND ANAEROBIC Blood Culture adequate volume   Culture   Final    NO GROWTH 4 DAYS Performed at [facility], [address], [place], [place] [address]    Report Status PENDING  Incomplete  Culture, blood (Routine x 2)     Status: None (Preliminary result)   Collection Time: 06/19/17 11:30 PM  Result Value Ref Range Status   Specimen Description BLOOD LEFT WRIST  Final   Special Requests   Final    BOTTLES DRAWN AEROBIC ONLY Blood Culture results may not be optimal due to an inadequate volume of blood received in culture bottles   Culture   Final    NO GROWTH 4 DAYS Performed at [facility], [place] [address], [place], [place] [address]    Report Status PENDING  Incomplete  Urine culture     Status: None   Collection Time: 06/20/17 12:35 AM  Result Value Ref Range Status   Specimen Description URINE, CATHETERIZED  Final   Special Requests NONE  Final   Culture   Final    NO GROWTH Performed at [facility], [place] [address] [place], [place] [address]    Report Status 06/21/2017 FINAL  Final     Labs: Basic Metabolic Panel: Recent Labs  Lab 06/19/17 2313 06/20/17 0720 06/21/17 0322 06/23/17 0826 06/24/17 1141  NA 149* 148* 149* 145 143  K 4.3 4.1 3.4* 3.4* 3.3*  CL 111 116* 114* 107 106  CO2 25 22 27 23 26   GLUCOSE 104* 84 92 77 86  BUN 26* 19 16 12 13   CREATININE 1.22 0.86 0.86 0.93 0.89  CALCIUM 10.0 8.6* 8.9 8.9 8.9  MG  --   --   --  1.9 1.8   Liver Function Tests: Recent Labs  Lab 06/19/17 2313 06/20/17 0720 06/21/17 0322  AST 37 28 31  ALT 23 18 17   ALKPHOS 83 67 63  BILITOT 2.9* 2.1* 2.2*  PROT 7.5 6.3* 6.3*  ALBUMIN 4.2 3.4* 3.2*   No results for input(s): LIPASE, AMYLASE in the last 168 hours. No results for input(s): AMMONIA in the last 168 hours. CBC: Recent Labs  Lab 06/19/17 2313 06/20/17 0720 06/21/17 0322 06/23/17 0826  WBC 8.3 7.7 6.7 7.1  NEUTROABS 5.4 4.6  --   --    HGB 18.7* 15.5 15.5 16.9  HCT 56.0* 48.1 47.5 50.6  MCV 92.9 93.4 92.6 88.9  PLT 146* 116* 122* 132*   Cardiac Enzymes: Recent Labs  Lab 06/20/17 0720 06/21/17 0322  TROPONINI 0.04* 0.03*   BNP: BNP (last 3 results) No results for input(s): BNP in the last 8760 hours.  ProBNP (last 3 results) No results for input(s): PROBNP in the last 8760 hours.  CBG: Recent Labs  Lab 06/23/17 1227 06/23/17 1808 06/24/17 0009 06/24/17 0602 06/24/17 0737  GLUCAP 112* 92 87 75 73       Signed:  Domenic Polite MD.  [facility] 06/24/2017, 3:38 PM

## 2017-06-24 NOTE — NC FL2 (Signed)
[place] LEVEL OF CARE SCREENING TOOL     IDENTIFICATION  Patient Name: Ronnie Nelson Birthdate: 1945/12/13 Sex: male Admission Date (Current Location): 06/19/2017  [facility] and [place] Number:  [employer] and Address:  [facility], [place] [address] [place], [place] [address]      Provider Number: [id number]  Attending Physician Name and Address:  Domenic Polite, MD  Relative Name and Phone Number:  Bonnita Nasuti [phone]    Current Level of Care: Hospital Recommended Level of Care: [place] Prior Approval Number:    Date Approved/Denied:   PASRR Number: [id number]  Discharge Plan: SNF    Current Diagnoses: Patient Active Problem List   Diagnosis Date Noted  . Sepsis ([place]) 06/20/2017  . Acute encephalopathy 06/20/2017  . 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 ICD (implantable cardioverter-defibrillator) lead failure 05/09/2014  . DM2 (diabetes mellitus, type 2) ([place]) 11/14/2012  . HTN (hypertension) 11/14/2012  . CAD (coronary artery disease) 11/14/2012  . Cardiomyopathy, ischemic 11/14/2012  . Chronic systolic CHF (congestive heart failure) ([place]) 11/14/2012  . NSVT (nonsustained ventricular tachycardia) ([place]) 11/14/2012  . ICD (implantable cardioverter-defibrillator) in place 11/14/2012  . Hyperlipidemia 11/14/2012  . Low TSH level 11/14/2012    Orientation RESPIRATION BLADDER Height & Weight     (Disoriented x4)  Normal Incontinent, External catheter Weight: 70.7 kg (155 lb 13.8 oz) Height:     BEHAVIORAL SYMPTOMS/MOOD NEUROLOGICAL BOWEL NUTRITION STATUS      Incontinent Diet(Please see DC Summary)  AMBULATORY STATUS COMMUNICATION OF NEEDS Skin   Extensive Assist Verbally Normal                       Personal Care Assistance Level of Assistance  Bathing, Feeding, Dressing Bathing Assistance: Maximum assistance Feeding assistance: Limited  assistance Dressing Assistance: Maximum assistance     Functional Limitations Info  Sight, Hearing, Speech Sight Info: Adequate Hearing Info: Adequate Speech Info: Adequate    SPECIAL CARE FACTORS FREQUENCY  PT (By licensed PT), OT (By licensed OT), Speech therapy     PT Frequency: 5x/week OT Frequency: 3x/week     Speech Therapy Frequency: 2x/week      Contractures      Additional Factors Info  Code Status, Allergies, Psychotropic Code Status Info: Full Allergies Info: NKA Psychotropic Info: Zoloft         Current Medications (06/24/2017):  This is the current hospital active medication list Current Facility-Administered Medications  Medication Dose Route Frequency Provider Last Rate Last Dose  . acetaminophen (TYLENOL) tablet 650 mg  650 mg Oral Q6H PRN Rise Patience, MD   650 mg at 06/23/17 1709   Or  . acetaminophen (TYLENOL) suppository 650 mg  650 mg Rectal Q6H PRN Rise Patience, MD      . amantadine (SYMMETREL) capsule 100 mg  100 mg Oral BID Rise Patience, MD   100 mg at 06/23/17 2304  . aspirin EC tablet 81 mg  81 mg Oral Daily Rise Patience, MD      . carvedilol (COREG) tablet 25 mg  25 mg Oral BID WC Rise Patience, MD   25 mg at 06/23/17 1709  . digoxin (LANOXIN) tablet 0.0625 mg  0.0625 mg Oral Daily Rise Patience, MD   0.0625 mg at 06/23/17 0926  . donepezil (ARICEPT) tablet 5 mg  5 mg Oral QHS Rise Patience, MD   5 mg at 06/23/17  2304  . enoxaparin (LOVENOX) injection 40 mg  40 mg Subcutaneous Q24H Hall, Carole N, DO   40 mg at 06/23/17 1353  . ezetimibe (ZETIA) tablet 10 mg  10 mg Oral QHS Rise Patience, MD   10 mg at 06/23/17 2304  . hydrALAZINE (APRESOLINE) injection 10 mg  10 mg Intravenous Q4H PRN Rise Patience, MD   10 mg at 06/23/17 6967  . MEDLINE mouth rinse  15 mL Mouth Rinse BID Irene Pap N, DO   15 mL at 06/23/17 2304  . niacin (NIASPAN) CR tablet 500 mg  500 mg Oral QHS  Rise Patience, MD   500 mg at 06/23/17 2304  . nitroGLYCERIN (NITROSTAT) SL tablet 0.4 mg  0.4 mg Sublingual Q5 min PRN Rise Patience, MD      . ondansetron [facility]) tablet 4 mg  4 mg Oral Q6H PRN Rise Patience, MD       Or  . ondansetron [facility]) injection 4 mg  4 mg Intravenous Q6H PRN Rise Patience, MD      . pantoprazole (PROTONIX) EC tablet 40 mg  40 mg Oral Daily Rise Patience, MD   40 mg at 06/23/17 0926  . piperacillin-tazobactam (ZOSYN) IVPB 3.375 g  3.375 g Intravenous Q8H Rise Patience, MD 12.5 mL/hr at 06/24/17 0556 3.375 g at 06/24/17 0556  . potassium chloride (KLOR-CON) packet 40 mEq  40 mEq Oral BID Bhagat, Bhavinkumar, PA   40 mEq at 06/23/17 1353  . risperiDONE (RISPERDAL) tablet 0.25 mg  0.25 mg Oral BID PRN Rise Patience, MD      . sertraline (ZOLOFT) tablet 100 mg  100 mg Oral Daily Rise Patience, MD   100 mg at 06/23/17 0926  . sucralfate (CARAFATE) tablet 1 g  1 g Oral TID WC & HS Rise Patience, MD   1 g at 06/23/17 2304     Discharge Medications: Please see discharge summary for a list of discharge medications.  Relevant Imaging Results:  Relevant Lab Results:   Additional Information SSN: [place], [place]

## 2017-06-24 NOTE — Progress Notes (Signed)
This RN just got out of report and realized that the pt had been discharged and this RN was not notified of the family member's arrival to take the patient home. The pt was walked out by another staff member. The pt was not cognitive enough to have him sign the d/c papers that were placed in his room by the day RN before this RN. The D/C orders needed to be gone over and signed. This RN was unable to do that as the pt was walked out of the floor without this RN being made aware that the patient was ready to go. CN made aware of the situation.

## 2017-06-24 NOTE — Care Management Note (Addendum)
Case Management Note  Patient Details  Name: Ronnie Nelson MRN: [id number] Date of Birth: 11-20-1945  Subjective/Objective:     Admitted with sepsis.   PCP:  Alean Rinne  Action/Plan: Transition to home with daughter, home health services to follow.  Daughter,Pam,states will provide transportation to home.  Expected Discharge Date:  06/24/17               Expected Discharge Plan:  [name]  In-House Referral:  Clinical Social Work  Discharge planning Services  CM Consult  Post Acute Care Choice:   home health Choice offered to:    patient DME Arranged:   N/A DME Agency:   N/A  HH Arranged:  PT, OT, RN [place] Agency:   [facility] [phone]), PTA pt was active with [facility].  Home health orders  faxed to [phone] per NCM.  Status of Service:  Completed, signed off  If discussed at [place] of Stay Meetings, dates discussed:    Additional Comments:  Sharin Mons, RN 06/24/2017, 1:01 PM

## 2017-06-24 NOTE — Progress Notes (Signed)
Received call from telemetry that patient had a 9 beat run of v-tach. MD notified.

## 2017-06-24 NOTE — Progress Notes (Signed)
Patient only urinated 85cc for the shift. Bladder scan showed 116cc. Will report this to on-coming RN to monitor

## 2017-06-24 NOTE — Progress Notes (Signed)
    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  A cane will not resolve  issue with performing activities of daily living. A wheelchair will allow patient to safely perform daily activities. Patient can safely propel the wheelchair in the home or has a caregiver who can provide assistance.  Accessories: elevating leg rests (ELRs), wheel locks, extensions and anti-tippers.   06/24/17 1538

## 2017-06-24 NOTE — Progress Notes (Signed)
CSW received consult regarding PT recommendation of SNF at discharge.  Patient's daughter and sister are refusing SNF. Daughter states that she lives with the patient and they will be taking him back home with home health in [place]. RNCM notified. Family will pick patient up by car.  CSW signing off.   Percell Locus Ronnie Miedema LCSW [phone]

## 2017-06-25 LAB — CULTURE, BLOOD (ROUTINE X 2)
CULTURE: NO GROWTH
CULTURE: NO GROWTH
Special Requests: ADEQUATE

## 2017-06-30 ENCOUNTER — Ambulatory Visit (INDEPENDENT_AMBULATORY_CARE_PROVIDER_SITE_OTHER): Payer: Medicare Other | Admitting: *Deleted

## 2017-06-30 DIAGNOSIS — I499 Cardiac arrhythmia, unspecified: Secondary | ICD-10-CM

## 2017-06-30 DIAGNOSIS — I428 Other cardiomyopathies: Secondary | ICD-10-CM

## 2017-06-30 DIAGNOSIS — I429 Cardiomyopathy, unspecified: Secondary | ICD-10-CM

## 2017-06-30 NOTE — Progress Notes (Signed)
Remote ICD transmission.   

## 2017-07-10 LAB — CUP PACEART REMOTE DEVICE CHECK
Battery Remaining Longevity: 111 mo
Battery Voltage: 3 V
Brady Statistic RV Percent Paced: 0.02 %
HIGH POWER IMPEDANCE MEASURED VALUE: 60 Ohm
Implantable Lead Implant Date: 20160329
Lead Channel Impedance Value: 342 Ohm
Lead Channel Impedance Value: 399 Ohm
Lead Channel Pacing Threshold Amplitude: 0.75 V
Lead Channel Sensing Intrinsic Amplitude: 3 mV
Lead Channel Sensing Intrinsic Amplitude: 3 mV
Lead Channel Setting Pacing Pulse Width: 0.4 ms
MDC IDC LEAD LOCATION: 753860
MDC IDC MSMT LEADCHNL RV PACING THRESHOLD PULSEWIDTH: 0.4 ms
MDC IDC PG IMPLANT DT: 20160329
MDC IDC SESS DTM: 20190521082206
MDC IDC SET LEADCHNL RV PACING AMPLITUDE: 2 V
MDC IDC SET LEADCHNL RV SENSING SENSITIVITY: 0.3 mV

## 2017-08-05 ENCOUNTER — Inpatient Hospital Stay (HOSPITAL_COMMUNITY)
Admission: EM | Admit: 2017-08-05 | Discharge: 2017-08-14 | DRG: 292 | Disposition: A | Discharge status: Hospice | Payer: Medicare Other | Attending: Family Medicine | Admitting: Family Medicine

## 2017-08-05 ENCOUNTER — Other Ambulatory Visit: Payer: Self-pay

## 2017-08-05 ENCOUNTER — Emergency Department (HOSPITAL_COMMUNITY): Payer: Medicare Other

## 2017-08-05 ENCOUNTER — Other Ambulatory Visit: Payer: Self-pay | Admitting: Family Medicine

## 2017-08-05 ENCOUNTER — Encounter (HOSPITAL_COMMUNITY): Payer: Self-pay | Admitting: Emergency Medicine

## 2017-08-05 DIAGNOSIS — Z8673 Personal history of transient ischemic attack (TIA), and cerebral infarction without residual deficits: Secondary | ICD-10-CM | POA: Diagnosis not present

## 2017-08-05 DIAGNOSIS — I340 Nonrheumatic mitral (valve) insufficiency: Secondary | ICD-10-CM | POA: Diagnosis not present

## 2017-08-05 DIAGNOSIS — K219 Gastro-esophageal reflux disease without esophagitis: Secondary | ICD-10-CM | POA: Diagnosis present

## 2017-08-05 DIAGNOSIS — I5023 Acute on chronic systolic (congestive) heart failure: Secondary | ICD-10-CM | POA: Diagnosis present

## 2017-08-05 DIAGNOSIS — Z87891 Personal history of nicotine dependence: Secondary | ICD-10-CM

## 2017-08-05 DIAGNOSIS — Z95810 Presence of automatic (implantable) cardiac defibrillator: Secondary | ICD-10-CM

## 2017-08-05 DIAGNOSIS — I25810 Atherosclerosis of coronary artery bypass graft(s) without angina pectoris: Secondary | ICD-10-CM | POA: Diagnosis not present

## 2017-08-05 DIAGNOSIS — F0390 Unspecified dementia without behavioral disturbance: Secondary | ICD-10-CM | POA: Diagnosis present

## 2017-08-05 DIAGNOSIS — R400 Somnolence: Secondary | ICD-10-CM | POA: Diagnosis not present

## 2017-08-05 DIAGNOSIS — Z79899 Other long term (current) drug therapy: Secondary | ICD-10-CM | POA: Diagnosis not present

## 2017-08-05 DIAGNOSIS — I5042 Chronic combined systolic (congestive) and diastolic (congestive) heart failure: Secondary | ICD-10-CM | POA: Diagnosis not present

## 2017-08-05 DIAGNOSIS — I493 Ventricular premature depolarization: Secondary | ICD-10-CM | POA: Diagnosis present

## 2017-08-05 DIAGNOSIS — I255 Ischemic cardiomyopathy: Secondary | ICD-10-CM | POA: Diagnosis present

## 2017-08-05 DIAGNOSIS — L89152 Pressure ulcer of sacral region, stage 2: Secondary | ICD-10-CM | POA: Diagnosis present

## 2017-08-05 DIAGNOSIS — E785 Hyperlipidemia, unspecified: Secondary | ICD-10-CM | POA: Diagnosis present

## 2017-08-05 DIAGNOSIS — I447 Left bundle-branch block, unspecified: Secondary | ICD-10-CM | POA: Diagnosis present

## 2017-08-05 DIAGNOSIS — Z7982 Long term (current) use of aspirin: Secondary | ICD-10-CM | POA: Diagnosis not present

## 2017-08-05 DIAGNOSIS — B9620 Unspecified Escherichia coli [E. coli] as the cause of diseases classified elsewhere: Secondary | ICD-10-CM | POA: Diagnosis present

## 2017-08-05 DIAGNOSIS — E039 Hypothyroidism, unspecified: Secondary | ICD-10-CM | POA: Diagnosis present

## 2017-08-05 DIAGNOSIS — G4710 Hypersomnia, unspecified: Secondary | ICD-10-CM | POA: Diagnosis not present

## 2017-08-05 DIAGNOSIS — Z515 Encounter for palliative care: Secondary | ICD-10-CM

## 2017-08-05 DIAGNOSIS — I959 Hypotension, unspecified: Secondary | ICD-10-CM | POA: Diagnosis not present

## 2017-08-05 DIAGNOSIS — I2510 Atherosclerotic heart disease of native coronary artery without angina pectoris: Secondary | ICD-10-CM | POA: Diagnosis present

## 2017-08-05 DIAGNOSIS — R7303 Prediabetes: Secondary | ICD-10-CM | POA: Diagnosis present

## 2017-08-05 DIAGNOSIS — I472 Ventricular tachycardia: Secondary | ICD-10-CM | POA: Diagnosis present

## 2017-08-05 DIAGNOSIS — Z951 Presence of aortocoronary bypass graft: Secondary | ICD-10-CM | POA: Diagnosis not present

## 2017-08-05 DIAGNOSIS — Z7189 Other specified counseling: Secondary | ICD-10-CM | POA: Diagnosis not present

## 2017-08-05 DIAGNOSIS — I110 Hypertensive heart disease with heart failure: Secondary | ICD-10-CM | POA: Diagnosis present

## 2017-08-05 DIAGNOSIS — E876 Hypokalemia: Secondary | ICD-10-CM | POA: Diagnosis not present

## 2017-08-05 DIAGNOSIS — Z792 Long term (current) use of antibiotics: Secondary | ICD-10-CM

## 2017-08-05 DIAGNOSIS — I4510 Unspecified right bundle-branch block: Secondary | ICD-10-CM | POA: Diagnosis present

## 2017-08-05 DIAGNOSIS — R079 Chest pain, unspecified: Secondary | ICD-10-CM | POA: Diagnosis present

## 2017-08-05 DIAGNOSIS — G8929 Other chronic pain: Secondary | ICD-10-CM | POA: Diagnosis present

## 2017-08-05 DIAGNOSIS — F0391 Unspecified dementia with behavioral disturbance: Secondary | ICD-10-CM

## 2017-08-05 DIAGNOSIS — D333 Benign neoplasm of cranial nerves: Secondary | ICD-10-CM | POA: Diagnosis present

## 2017-08-05 DIAGNOSIS — F329 Major depressive disorder, single episode, unspecified: Secondary | ICD-10-CM | POA: Diagnosis present

## 2017-08-05 DIAGNOSIS — E0590 Thyrotoxicosis, unspecified without thyrotoxic crisis or storm: Secondary | ICD-10-CM | POA: Diagnosis present

## 2017-08-05 DIAGNOSIS — I509 Heart failure, unspecified: Secondary | ICD-10-CM

## 2017-08-05 DIAGNOSIS — F03918 Unspecified dementia, unspecified severity, with other behavioral disturbance: Secondary | ICD-10-CM | POA: Insufficient documentation

## 2017-08-05 DIAGNOSIS — R4781 Slurred speech: Secondary | ICD-10-CM | POA: Diagnosis present

## 2017-08-05 DIAGNOSIS — N3000 Acute cystitis without hematuria: Secondary | ICD-10-CM | POA: Diagnosis present

## 2017-08-05 DIAGNOSIS — H9193 Unspecified hearing loss, bilateral: Secondary | ICD-10-CM | POA: Diagnosis present

## 2017-08-05 LAB — URINALYSIS, ROUTINE W REFLEX MICROSCOPIC
Bilirubin Urine: NEGATIVE
Glucose, UA: NEGATIVE mg/dL
KETONES UR: NEGATIVE mg/dL
Nitrite: POSITIVE — AB
PH: 5 (ref 5.0–8.0)
PROTEIN: NEGATIVE mg/dL
Specific Gravity, Urine: 1.013 (ref 1.005–1.030)

## 2017-08-05 LAB — COMPREHENSIVE METABOLIC PANEL
ALBUMIN: 3.5 g/dL (ref 3.5–5.0)
ALK PHOS: 73 U/L (ref 38–126)
ALT: 22 U/L (ref 0–44)
ANION GAP: 10 (ref 5–15)
AST: 23 U/L (ref 15–41)
BILIRUBIN TOTAL: 1.1 mg/dL (ref 0.3–1.2)
BUN: 10 mg/dL (ref 8–23)
CO2: 23 mmol/L (ref 22–32)
Calcium: 9 mg/dL (ref 8.9–10.3)
Chloride: 108 mmol/L (ref 98–111)
Creatinine, Ser: 0.91 mg/dL (ref 0.61–1.24)
GFR calc Af Amer: >60 mL/min (ref >60)
GLUCOSE: 105 mg/dL — AB (ref 70–99)
POTASSIUM: 3.8 mmol/L (ref 3.5–5.1)
Sodium: 141 mmol/L (ref 135–145)
TOTAL PROTEIN: 6.4 g/dL — AB (ref 6.5–8.1)

## 2017-08-05 LAB — I-STAT CHEM 8, ED
BUN: 12 mg/dL (ref 8–23)
Calcium, Ion: 1.16 mmol/L (ref 1.15–1.40)
Chloride: 105 mmol/L (ref 98–111)
Creatinine, Ser: 0.8 mg/dL (ref 0.61–1.24)
Glucose, Bld: 103 mg/dL — ABNORMAL HIGH (ref 70–99)
HCT: 42 % (ref 39.0–52.0)
Hemoglobin: 14.3 g/dL (ref 13.0–17.0)
Potassium: 3.7 mmol/L (ref 3.5–5.1)
SODIUM: 143 mmol/L (ref 135–145)
TCO2: 26 mmol/L (ref 22–32)

## 2017-08-05 LAB — CBC
HCT: 43.1 % (ref 39.0–52.0)
Hemoglobin: 13.8 g/dL (ref 13.0–17.0)
MCH: 29.9 pg (ref 26.0–34.0)
MCHC: 32 g/dL (ref 30.0–36.0)
MCV: 93.3 fL (ref 78.0–100.0)
PLATELETS: 175 10*3/uL (ref 150–400)
RBC: 4.62 MIL/uL (ref 4.22–5.81)
RDW: 16.2 % — AB (ref 11.5–15.5)
WBC: 6.9 10*3/uL (ref 4.0–10.5)

## 2017-08-05 LAB — I-STAT TROPONIN, ED: TROPONIN I, POC: 0.01 ng/mL (ref 0.00–0.08)

## 2017-08-05 LAB — DIFFERENTIAL
Abs Immature Granulocytes: 0 10*3/uL (ref 0.0–0.1)
Basophils Absolute: 0 10*3/uL (ref 0.0–0.1)
Basophils Relative: 0 %
EOS ABS: 0.1 10*3/uL (ref 0.0–0.7)
EOS PCT: 2 %
Immature Granulocytes: 0 %
LYMPHS ABS: 1.6 10*3/uL (ref 0.7–4.0)
LYMPHS PCT: 24 %
MONO ABS: 0.4 10*3/uL (ref 0.1–1.0)
MONOS PCT: 6 %
NEUTROS PCT: 68 %
Neutro Abs: 4.7 10*3/uL (ref 1.7–7.7)

## 2017-08-05 LAB — HEMOGLOBIN A1C
HEMOGLOBIN A1C: 5.8 % — AB (ref 4.8–5.6)
MEAN PLASMA GLUCOSE: 119.76 mg/dL

## 2017-08-05 LAB — PROTIME-INR
INR: 1.17
PROTHROMBIN TIME: 14.8 s (ref 11.4–15.2)

## 2017-08-05 LAB — TROPONIN I: Troponin I: <0.03 ng/mL (ref <0.03)

## 2017-08-05 LAB — GLUCOSE, CAPILLARY
GLUCOSE-CAPILLARY: 87 mg/dL (ref 70–99)
Glucose-Capillary: 79 mg/dL (ref 70–99)

## 2017-08-05 LAB — BRAIN NATRIURETIC PEPTIDE: B NATRIURETIC PEPTIDE 5: 1988.2 pg/mL — AB (ref 0.0–100.0)

## 2017-08-05 LAB — TSH: TSH: 0.14 u[IU]/mL — ABNORMAL LOW (ref 0.350–4.500)

## 2017-08-05 LAB — PHOSPHORUS: PHOSPHORUS: 3.3 mg/dL (ref 2.5–4.6)

## 2017-08-05 LAB — APTT: aPTT: 29 seconds (ref 24–36)

## 2017-08-05 LAB — MAGNESIUM: Magnesium: 1.9 mg/dL (ref 1.7–2.4)

## 2017-08-05 LAB — CBG MONITORING, ED: GLUCOSE-CAPILLARY: 101 mg/dL — AB (ref 70–99)

## 2017-08-05 MED ORDER — MIRTAZAPINE 7.5 MG PO TABS
7.5000 mg | ORAL_TABLET | Freq: Every day | ORAL | Status: DC
  Administered 2017-08-05 – 2017-08-13 (×8): 7.5 mg via ORAL
  Filled 2017-08-05 (×8): qty 1

## 2017-08-05 MED ORDER — CARVEDILOL 25 MG PO TABS
25.0000 mg | ORAL_TABLET | Freq: Two times a day (BID) | ORAL | Status: DC
  Administered 2017-08-06 – 2017-08-10 (×9): 25 mg via ORAL
  Filled 2017-08-05: qty 2
  Filled 2017-08-05 (×8): qty 1

## 2017-08-05 MED ORDER — CEFTRIAXONE SODIUM 1 G IJ SOLR
1.0000 g | INTRAMUSCULAR | Status: DC
Start: 2017-08-05 — End: 2017-08-06
  Administered 2017-08-05: 1 g via INTRAVENOUS
  Filled 2017-08-05: qty 10

## 2017-08-05 MED ORDER — RAMIPRIL 10 MG PO CAPS
10.0000 mg | ORAL_CAPSULE | Freq: Every day | ORAL | Status: DC
  Administered 2017-08-06: 10 mg via ORAL
  Filled 2017-08-05: qty 1
  Filled 2017-08-05: qty 4

## 2017-08-05 MED ORDER — ACETAMINOPHEN 325 MG PO TABS
650.0000 mg | ORAL_TABLET | Freq: Four times a day (QID) | ORAL | Status: DC | PRN
  Administered 2017-08-06 – 2017-08-13 (×3): 650 mg via ORAL
  Filled 2017-08-05 (×3): qty 2

## 2017-08-05 MED ORDER — INSULIN ASPART 100 UNIT/ML ~~LOC~~ SOLN
0.0000 [IU] | Freq: Three times a day (TID) | SUBCUTANEOUS | Status: DC
  Administered 2017-08-06 – 2017-08-10 (×3): 1 [IU] via SUBCUTANEOUS
  Administered 2017-08-10: 2 [IU] via SUBCUTANEOUS
  Administered 2017-08-11: 3 [IU] via SUBCUTANEOUS

## 2017-08-05 MED ORDER — FUROSEMIDE 10 MG/ML IJ SOLN
40.0000 mg | Freq: Once | INTRAMUSCULAR | Status: AC
  Administered 2017-08-05: 40 mg via INTRAVENOUS
  Filled 2017-08-05: qty 4

## 2017-08-05 MED ORDER — FUROSEMIDE 10 MG/ML IJ SOLN
40.0000 mg | Freq: Once | INTRAMUSCULAR | Status: AC
  Administered 2017-08-06: 40 mg via INTRAVENOUS
  Filled 2017-08-05: qty 4

## 2017-08-05 MED ORDER — EZETIMIBE 10 MG PO TABS
10.0000 mg | ORAL_TABLET | Freq: Every day | ORAL | Status: DC
  Administered 2017-08-05 – 2017-08-13 (×8): 10 mg via ORAL
  Filled 2017-08-05 (×8): qty 1

## 2017-08-05 MED ORDER — DIGOXIN 125 MCG PO TABS
0.0625 mg | ORAL_TABLET | Freq: Every day | ORAL | Status: DC
  Administered 2017-08-06 – 2017-08-14 (×9): 0.0625 mg via ORAL
  Filled 2017-08-05 (×10): qty 1

## 2017-08-05 MED ORDER — AMANTADINE HCL 100 MG PO CAPS
100.0000 mg | ORAL_CAPSULE | Freq: Two times a day (BID) | ORAL | Status: DC
  Administered 2017-08-05 – 2017-08-13 (×16): 100 mg via ORAL
  Filled 2017-08-05 (×16): qty 1

## 2017-08-05 MED ORDER — ATORVASTATIN CALCIUM 40 MG PO TABS: 40.0000 mg | ORAL_TABLET | Freq: Every day | ORAL | Status: DC

## 2017-08-05 MED ORDER — DONEPEZIL HCL 5 MG PO TABS
5.0000 mg | ORAL_TABLET | Freq: Every day | ORAL | Status: DC
  Administered 2017-08-05 – 2017-08-13 (×8): 5 mg via ORAL
  Filled 2017-08-05 (×8): qty 1

## 2017-08-05 MED ORDER — NIACIN ER (ANTIHYPERLIPIDEMIC) 500 MG PO TBCR
500.0000 mg | EXTENDED_RELEASE_TABLET | Freq: Every day | ORAL | Status: DC
  Administered 2017-08-05: 500 mg via ORAL
  Filled 2017-08-05: qty 1

## 2017-08-05 MED ORDER — SERTRALINE HCL 100 MG PO TABS
100.0000 mg | ORAL_TABLET | Freq: Every day | ORAL | Status: DC
  Administered 2017-08-06 – 2017-08-14 (×9): 100 mg via ORAL
  Filled 2017-08-05 (×9): qty 1

## 2017-08-05 MED ORDER — ENOXAPARIN SODIUM 40 MG/0.4ML ~~LOC~~ SOLN
40.0000 mg | SUBCUTANEOUS | Status: DC
  Administered 2017-08-05 – 2017-08-13 (×8): 40 mg via SUBCUTANEOUS
  Filled 2017-08-05 (×9): qty 0.4

## 2017-08-05 MED ORDER — ASPIRIN EC 81 MG PO TBEC
81.0000 mg | DELAYED_RELEASE_TABLET | Freq: Every day | ORAL | Status: DC
  Administered 2017-08-06 – 2017-08-13 (×8): 81 mg via ORAL
  Filled 2017-08-05 (×8): qty 1

## 2017-08-05 MED ORDER — PANTOPRAZOLE SODIUM 40 MG PO TBEC
40.0000 mg | DELAYED_RELEASE_TABLET | Freq: Two times a day (BID) | ORAL | Status: DC
  Administered 2017-08-05 – 2017-08-14 (×18): 40 mg via ORAL
  Filled 2017-08-05 (×18): qty 1

## 2017-08-05 MED ORDER — ACETAMINOPHEN 650 MG RE SUPP: 650.0000 mg | Freq: Four times a day (QID) | RECTAL | Status: DC | PRN

## 2017-08-05 NOTE — ED Provider Notes (Signed)
[place] EMERGENCY DEPARTMENT Provider Note   CSN: [id number] Arrival date & time: 08/05/17  1532   LEVEL 5 CAVEAT - ALTERED MENTAL STATUS  History   Chief Complaint Chief Complaint  Patient presents with  . Slurred speech  . Altered Mental Status    HPI Ronnie Nelson is a 72 y.o. male.  HPI  72 year old male presents with slurred speech, altered mental status, and chest pain.  The history is limited as the patient is confused and no family is present.  EMS has already left prior to me seeing the patient.  They report that he was last known normal at 10 AM.  Around 145 he was noted to be confused and having slurred speech.  Is also complaining of chest pain.  He denies slurred speech and tells me the chest pain feels dull and is both left lower and right lower chest.  No shortness of breath.  He has chronic bilateral lower extremity edema.  He denies any headache.  Patient and EMS report the patient recently had a TIA.  Past Medical History:  Diagnosis Date  . CAD (coronary artery disease)    a. CABG 1996: SVG to LAD,LIMA to CX,SVG to RCA  . DM (diabetes mellitus) ([place])   . HTN (hypertension)   . Hyperlipidemia   . ICD (implantable cardioverter-defibrillator) in place    a. Generator Medtronic Evera MRI [place] VR model [id number] serial number [id number]  . Ischemic cardiomyopathy    a.s/p ICD placement    Patient Active Problem List   Diagnosis Date Noted  . CHF exacerbation ([place]) 08/05/2017  . Chest pain at rest   . Dementia with behavioral disturbance   . Sepsis ([place]) 06/20/2017  . Acute encephalopathy 06/20/2017  . 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 ICD (implantable cardioverter-defibrillator) lead failure 05/09/2014  . DM2 (diabetes mellitus, type 2) ([place]) 11/14/2012  . HTN (hypertension) 11/14/2012  . CAD (coronary artery disease) 11/14/2012  . Cardiomyopathy, ischemic 11/14/2012  . Chronic systolic CHF  (congestive heart failure) ([place]) 11/14/2012  . NSVT (nonsustained ventricular tachycardia) ([place]) 11/14/2012  . ICD (implantable cardioverter-defibrillator) in place 11/14/2012  . Hyperlipidemia 11/14/2012  . Low TSH level 11/14/2012    Past Surgical History:  Procedure Laterality Date  . CARDIAC CATHETERIZATION  11/14/1998   severe depressed LV systolic function EF 56-43%  . CARDIAC DEFIBRILLATOR PLACEMENT  06/08/07   Medtronic  . CORONARY ARTERY BYPASS GRAFT  1996   [facility]  . IMPLANTABLE CARDIOVERTER DEFIBRILLATOR GENERATOR CHANGE N/A 05/09/2014   Procedure: IMPLANTABLE CARDIOVERTER DEFIBRILLATOR GENERATOR CHANGE;  Surgeon: Sanda Klein, MD;  Location: [place] CATH LAB;  Service: Cardiovascular;  Laterality: N/A;  . NM MYOCAR PERF WALL MOTION  11/21/2011   no significant ischemia        Home Medications    Prior to Admission medications   Medication Sig Start Date End Date Taking? Authorizing Provider  acetaminophen (TYLENOL) 325 MG tablet Take 2 tablets (650 mg total) by mouth every 6 (six) hours as needed for mild pain (or Fever >/= 101). 06/24/17   Domenic Polite, MD  amantadine (SYMMETREL) 100 MG capsule Take 100 mg by mouth 2 (two) times daily.    [provider]  amoxicillin-clavulanate (AUGMENTIN) 875-125 MG tablet Take 1 tablet by mouth every 12 (twelve) hours. For 2days Patient not taking: Reported on 08/05/2017 06/24/17   Domenic Polite, MD  aspirin EC 81 MG tablet Take 81 mg by mouth daily.  [provider]  carvedilol (COREG) 25 MG tablet TAKE 1 TABLET BY MOUTH TWICE A DAY WITH A MEAL 03/31/16   Croitoru, Mihai, MD  digoxin (LANOXIN) 0.125 MG tablet Take 0.0625 mg by mouth daily.     [provider]  donepezil (ARICEPT) 5 MG tablet Take 5 mg by mouth at bedtime.    [provider]  doxycycline (VIBRAMYCIN) 100 MG capsule Take 100 mg by mouth 2 (two) times daily. For 10 days 08/03/17 08/13/17  [provider]  ezetimibe  (ZETIA) 10 MG tablet Take 10 mg by mouth at bedtime.     [provider]  fluticasone (FLONASE) 50 MCG/ACT nasal spray Place 1 spray into both nostrils daily.    [provider]  furosemide (LASIX) 20 MG tablet Take 20 mg by mouth daily.     [provider]  mirtazapine (REMERON) 7.5 MG tablet Take 7.5 mg by mouth at bedtime.    [provider]  niacin (NIASPAN) 500 MG CR tablet Take 500 mg by mouth at bedtime.  04/04/14   [provider]  nitroGLYCERIN (NITROSTAT) 0.4 MG SL tablet Place 0.4 mg under the tongue every 5 (five) minutes as needed for chest pain.    [provider]  nystatin (NYSTATIN) powder Apply 1 g topically 3 (three) times daily.    [provider]  omeprazole (PRILOSEC) 20 MG capsule Take 20 mg by mouth 2 (two) times daily.     [provider]  ramipril (ALTACE) 10 MG capsule Take 10 mg by mouth daily.    [provider]  risperiDONE (RISPERDAL) 0.25 MG tablet Take 0.25 mg by mouth 2 (two) times daily as needed (agitation).    [provider]  sertraline (ZOLOFT) 100 MG tablet Take 100 mg by mouth daily.    [provider]  simvastatin (ZOCOR) 80 MG tablet Take 80 mg by mouth at bedtime.    [provider]  sucralfate (CARAFATE) 1 G tablet Take 1 g by mouth 4 (four) times daily -  with meals and at bedtime.  11/01/12   [provider]    Family History Family History  Family history unknown: Yes    Social History Social History   Tobacco Use  . Smoking status: Former Smoker    Last attempt to quit: 02/10/1995    Years since quitting: 22.4  . Smokeless tobacco: Never Used  Substance Use Topics  . Alcohol use: No  . Drug use: No     Allergies   Patient has no known allergies.   Review of Systems Review of Systems  Unable to perform ROS: Mental status change     Physical Exam Updated Vital Signs BP (!) 113/92 (BP Location: Left Arm)   Pulse  71   Temp 98.4 F (36.9 C) (Oral)   Resp 18   Ht 5\' 7"  (1.702 m)   Wt 70.6 kg (155 lb 10.3 oz)   SpO2 98%   BMI 24.38 kg/m   Physical Exam  Constitutional: He appears well-developed and well-nourished. No distress.  HENT:  Head: Normocephalic and atraumatic.  Right Ear: External ear normal.  Left Ear: External ear normal.  Nose: Nose normal.  Eyes: Pupils are equal, round, and reactive to light. EOM are normal. Right eye exhibits no discharge. Left eye exhibits no discharge.  Neck: Neck supple.  Cardiovascular: Normal rate and normal heart sounds. An irregular rhythm present.  Pulmonary/Chest: Effort normal and breath sounds normal.  Abdominal: Soft.  He exhibits no distension. There is no tenderness.  Musculoskeletal: He exhibits edema (bilateral, feet and ankles).  Neurological: He is alert. He is disoriented.  Mildly slurred speech. Oriented to person. Disoriented to place and time. CN 3-12 grossly intact. 5/5 strength in all 4 extremities. Grossly normal sensation. Normal finger to nose.   Skin: Skin is warm and dry. He is not diaphoretic.  Nursing note and vitals reviewed.    ED Treatments / Results  Labs (all labs ordered are listed, but only abnormal results are displayed) Labs Reviewed  CBC - Abnormal; Notable for the following components:      Result Value   RDW 16.2 (*)    All other components within normal limits  COMPREHENSIVE METABOLIC PANEL - Abnormal; Notable for the following components:   Glucose, Bld 105 (*)    Total Protein 6.4 (*)    All other components within normal limits  BRAIN NATRIURETIC PEPTIDE - Abnormal; Notable for the following components:   B Natriuretic Peptide 1,988.2 (*)    All other components within normal limits  URINALYSIS, ROUTINE W REFLEX MICROSCOPIC - Abnormal; Notable for the following components:   APPearance HAZY (*)    Hgb urine dipstick MODERATE (*)    Nitrite POSITIVE (*)    Leukocytes, UA SMALL (*)    Bacteria, UA MANY  (*)    All other components within normal limits  TSH - Abnormal; Notable for the following components:   TSH 0.140 (*)    All other components within normal limits  HEMOGLOBIN A1C - Abnormal; Notable for the following components:   Hgb A1c MFr Bld 5.8 (*)    All other components within normal limits  CBG MONITORING, ED - Abnormal; Notable for the following components:   Glucose-Capillary 101 (*)    All other components within normal limits  I-STAT CHEM 8, ED - Abnormal; Notable for the following components:   Glucose, Bld 103 (*)    All other components within normal limits  URINE CULTURE  PROTIME-INR  APTT  DIFFERENTIAL  TROPONIN I  MAGNESIUM  PHOSPHORUS  TROPONIN I  GLUCOSE, CAPILLARY  HIV ANTIBODY (ROUTINE TESTING)  TROPONIN I  TROPONIN I  BASIC METABOLIC PANEL  T4, FREE  I-STAT TROPONIN, ED  CBG MONITORING, ED    EKG EKG Interpretation  Date/Time:  Wednesday August 05 2017 15:41:11 EDT Ventricular Rate:  72 PR Interval:    QRS Duration: 172 QT Interval:  470 QTC Calculation: 515 R Axis:   130 Text Interpretation:  Sinus rhythm Multiform ventricular premature complexes Left atrial enlargement 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 EXAM: CHEST - 2 VIEW COMPARISON:  Chest x-ray dated Jun 19, 2017. FINDINGS: The lateral view is limited due to overlapping arms. Unchanged left chest wall pacer device. Stable cardiomegaly status post CABG. Normal pulmonary vascularity. Unchanged scarring/atelectasis in the left lower lobe. No focal consolidation, pleural effusion, or pneumothorax. No acute osseous abnormality. IMPRESSION: No active cardiopulmonary disease. Electronically Signed   By: Titus Dubin M.D.   On: 08/05/2017 17:12   Ct Head Wo Contrast  Result Date: 08/05/2017 CLINICAL DATA:  Increased mental status and slurred speech  with chest pain beginning this afternoon. EXAM: CT HEAD WITHOUT CONTRAST TECHNIQUE: Contiguous axial images were obtained from the base of the skull through the vertex without intravenous contrast. COMPARISON:  06/19/2017 and  05/16/2017 FINDINGS: Brain: Examination demonstrates mild stable prominence of the lateral and third ventricles. Cisterns are within normal. There is minimal chronic ischemic microvascular disease. 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 No evidence of intra-axial mass, mass effect, shift of midline structures or acute hemorrhage. No evidence of acute infarction. 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 Vascular: No hyperdense vessel or unexpected calcification. Skull: Normal. Negative for fracture or focal lesion. Sinuses/Orbits: No acute finding. Other: None. IMPRESSION: No acute findings. Minimal chronic ischemic microvascular disease. 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 Electronically Signed   By: Marin Olp M.D.   On: 08/05/2017 16:44    Procedures Procedures (including critical care time)  Medications Ordered in ED Medications  amantadine (SYMMETREL) capsule 100 mg (100 mg Oral Given 08/05/17 2142)  aspirin EC tablet 81 mg (has no administration in time range)  carvedilol (COREG) tablet 25 mg (has no administration in time range)  digoxin (LANOXIN) tablet 0.0625 mg (has no administration in time range)  donepezil (ARICEPT) tablet 5 mg (5 mg Oral Given 08/05/17 2142)  ezetimibe (ZETIA) tablet 10 mg (10 mg Oral Given 08/05/17 2142)  mirtazapine (REMERON) tablet 7.5 mg (7.5 mg Oral Given 08/05/17 2142)  niacin (NIASPAN) CR tablet 500 mg (500 mg Oral Given 08/05/17 2142)  pantoprazole (PROTONIX) EC tablet 40 mg (40 mg Oral Given 08/05/17 2142)  ramipril (ALTACE) capsule 10 mg (has no administration in time range)  sertraline  (ZOLOFT) tablet 100 mg (has no administration in time range)  atorvastatin (LIPITOR) tablet 40 mg (has no administration in time range)  enoxaparin (LOVENOX) injection 40 mg (40 mg Subcutaneous Given 08/05/17 2142)  acetaminophen (TYLENOL) tablet 650 mg (has no administration in time range)    Or  acetaminophen (TYLENOL) suppository 650 mg (has no administration in time range)  furosemide (LASIX) injection 40 mg (has no administration in time range)  cefTRIAXone (ROCEPHIN) 1 g in sodium chloride 0.9 % 100 mL IVPB (1 g Intravenous New Bag/Given 08/05/17 2141)  insulin aspart (novoLOG) injection 0-9 Units (has no administration in time range)  furosemide (LASIX) injection 40 mg (40 mg Intravenous Given 08/05/17 1821)     Initial Impression / Assessment and Plan / ED Course  I have reviewed the triage vital signs and the nursing notes.  Pertinent labs & imaging results that were available during my care of the patient were reviewed by me and considered in my medical decision making (see chart for details).     Initially patient was billed as slurred speech and altered mental status with chest pain.  However when I talked to the daughter over the phone, the patient has not been altered.  It seems like he has some dementia based on past records.  However the chest pain is new today.  Given his coronary disease he will need admission for further work-up.  His vitals have been unremarkable.  His blood pressure is a little bit low normal anti-do not think nitroglycerin is a good idea although he has taken this earlier today.  Admission for ACS rule out.  Final Clinical Impressions(s) / ED Diagnoses   Final diagnoses:  Chest pain at rest    ED Discharge Orders    None       Sherwood Gambler, MD 08/05/17 2302

## 2017-08-05 NOTE — ED Notes (Signed)
Patient transported to X-ray 

## 2017-08-05 NOTE — ED Notes (Signed)
Condom cath applied. Pt encouraged to urinate.

## 2017-08-05 NOTE — H&P (Addendum)
[facility] Admission History and Physical Service Pager: [phone]  Patient name: Ronnie Nelson Medical record number: [id number] Date of birth: May 11, 1945 Age: 72 y.o. Gender: male  Primary Care Provider: Coy Saunas, MD Consultants: None Code Status: Full  Chief Complaint: Chest pain  Assessment and Plan: DIONDRE PULIS is a 72 y.o. male with a past medical history significant for CAD status post CABG, hypertension, ischemic cardiomyopathy, dementia, depression, type 2 diabetes (unclear), stroke who presents today complaining of chest pain and found to have CHF exacerbation.  #Chest pain, acute, resolved Patient presented with chest pain for the past 2 days.  Patient described pain as a dull left-sided without any radiation. Shortness of breath also reported by patient in the past 2 days. In the ED initial troponin was negative.  EKG normal sinus rhythm with evidence of right bundle branch block.  Heart score 6. Atypical presentation for chest pain. Patient has known history of CAD with extensive disease burden and LAD, circumflex and right coronary.  No electrolyte abnormalities or vital signs instability noted.  Patient also has known history of ischemic cardiomyopathy with an ICD in place.  No recent SVT reported by defibrillator per cardiology.  Patient has elevated BNP with evidence of volume overload on exam.  Chest pain could possibly be secondary to progression of CAD versus CHF exacerbation.  Will admit for ACS rule out and management of acute heart failure exacerbation. --Admit to FMTS, admitting physician Dr. Ardelia Mems --Admit to telemetry --Consult Cardiology, appreciate recs --Completeled 1 dose 40 mg IV Lasix --Repeat Lasix 40 mg in the morning --Order repeat echo --Daily weights, strict I's and O's --EKG as needed --Nitroglycerin if BP allow or as needed  #UTI UA on admission showed small leukocyte esterase positive nitrite and moderate  hemoglobin.  Patient appears to be treated by PCP with doxycycline which is an unusual antibiotic for urinary tract infection.  Antibiotic choice could be due to a concern for possible prostatitis as a source of urinary tract infection. --Ceftriaxone 1 g IV --Follow-up on urine culture  #HFrEF Patient with a history of heart failure with reduced ejection fraction.  Last echo on April 2018 showed an EF of 20-25% with grade 3 diastolic dysfunction.  Patient also had mild to moderate dilated LA.  BNP on admission elevated at 1980.2.  Exam with mild crackles at the bases as well as lower extremity edema +1 pitting.  Weight on discharge at last admission (06/24/2017) was 155 pounds.  Patient has a symmetry on admission despite clear volume overload.  No evidence of  pulmonary edema or vascular congestion on chest x-ray.  Patient has recently lost 20 pounds.  Weight back in December 2018 at cardiologist office was 182 pounds.  Unclear dry weight. --Repeat echo --Diuresis as above --Patient usually on furosemide 20 mg daily  --Continue ramipril 10 mg daily --Continue coreg 25 mg twice daily --Continue digoxin 0. 0625 daily --Strict I's/O's --Daily weights  --Delene Loll transition considered by cardiology  #Nonsustained ventricular tachycardia (NSVT) Patient is followed by Dr.Croitoru and currently has ICD.  Patient has had a defibrillator since 2009.  Patient has had reported VT by device with no shock required.  Last pacing back in 2011.  Patient has frequent defibrillator check with cardiology. --Placed on telemetry  #CAD s/p CABG Patient presents today with left-sided chest pain described as dull.  Patient received aspirin 325 mg.  Chest pain has resolved.  Initial EKG showed T wave flattening in lateral  leads which resolved with repeat EKG. initial troponin negative.  Patient has extensive scarring in the distribution of his distal LAD circumflex and right coronary artery. --Trend troponin --Repeat  EKG as needed patient currently on telemetry --Could consider cardiology consult --Continue aspirin 81 mg  #Ischemic cardiomyopathy Patient has ICD in place with known reduced ejection fraction.  Closely followed by cardiology.   #History of subclinical hyperthyroidism Patient has history of low TSH with normal T4 consistent with clinical hypothyroidism.  Given cardiac history tendency for tachycardia/arrhythmias monitoring is warranted.  TSH today is low at 0.140. --Follow-up on free T4  #Hypertension BP on admission 114/85.  Well-controlled on current regimen. --Continue ramipril 10 mg daily --Continue Coreg 25 mg twice daily  #Right occipital stroke Patient has a recent history of occipital stroke for which she was hospitalized April 2018.  Developed some left-sided deficit in addition to confusion.  Patient was sent to rehab.  We will continue to monitor.  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 Patient has had issues with bilateral hearing loss followed by PCP.  Patient was seen by ENT in early May, with recommendation for cardiology clearance prior to discussing surgery versus CyberKnife therapy. --Defer to outpatient management  #Type 2 diabetes? Unclear diagnosis.  No glycemic control medications. --Order A1c --Sensitive SSI --CBG AC nightly  #Hyperlipidemia --Continue ezetimibe 10 mg daily, niacin 500 mg daily, simvastatin 80 mg  #Dementia Continue home regimen --Amantadine 100 mg twice daily --Donepezil 500 mg daily nightly --Risperidone 0.25 mg twice daily   #Depression Patient recently lost with back in September 2018.  Patient has been significantly affected by significant office loss.  In the past month patient has lost 20 pounds and was noted to be emotional multiple providers visit.  Recently started on SSRI. --Continue mirtazapine 7.5 mg nightly --Continue sertraline 100 mg  daily  #Sacral decubitus ulcer, stage II --Consult wound care  #GERD --Continue omeprazole  20 mg bid  --Continue ezetimibe 10 mg qhs --Continue sulcrafate 1 g qid  FEN/GI: Heart Healthy  Prophylaxis: Lovenox  Disposition: Home pending clinical improvement  History of Present Illness:  ELIAZ FOUT is a 72 y.o. male with a past medical history significant for CAD status post CABG, hypertension, ischemic cardiomyopathy, dementia, depression, type 2 diabetes (unclear), stroke who presents today with chest pain and lower extremity swelling.  Patient reports he has been having left-sided chest pain for the past 3 days.  Patient reports that pain is dull in nature.  Patient denies any radiation to arm shoulder or jaw.  Patient has also endorses shortness of breath, worsening the past 2 days.  He also reports worsening bilateral lower extremity swelling.  Patient was recently seen by PCP and was treated for what appears to be a urinary tract infection with doxycycline.  Today on initial presentation there was concern for altered mental status given delay response/speech.  However, upon further discussion with patient's sister, there was no concern for stroke assist reports that this is patient's baselin.  Patient does have a history of dementia.  Patient denies any cough, abdominal pain, nausea vomiting, headache, fever, chills.  Patient also denies any urinary symptoms such as dysuria.  It appears that patient has urinary incontinence as he was wearing pads when admitted.  In the ED, head CT was negative for any acute abnormality.  Chest x-ray did not show any signs of vascular congestion or interstitial edema.  Initial  troponin was negative.  EKG showed normal sinus rhythm with finding consistent with LVH.  UA showed positive nitrites, small leukocytes and moderate hemoglobin.  BNP was elevated at 1988.2.  Patient was given 40 mg IV Lasix.  Family medicine teaching service was consulted for admission  acute heart failure exacerbation.  Review Of Systems: Per HPI with the following additions:  Review of Systems  Constitutional: Negative.   HENT: Negative.   Eyes: Negative.   Respiratory: Positive for shortness of breath.   Cardiovascular: Positive for chest pain.  Gastrointestinal: Negative.   Genitourinary: Negative.   Musculoskeletal: Negative.   Skin: Negative.   Neurological: Negative.   Endo/Heme/Allergies: Negative.   Psychiatric/Behavioral: Negative.     Patient Active Problem List   Diagnosis Date Noted  . CHF exacerbation ([place]) 08/05/2017  . Sepsis ([place]) 06/20/2017  . Acute encephalopathy 06/20/2017  . 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 ICD (implantable cardioverter-defibrillator) lead failure 05/09/2014  . DM2 (diabetes mellitus, type 2) ([place]) 11/14/2012  . HTN (hypertension) 11/14/2012  . CAD (coronary artery disease) 11/14/2012  . Cardiomyopathy, ischemic 11/14/2012  . Chronic systolic CHF (congestive heart failure) ([place]) 11/14/2012  . NSVT (nonsustained ventricular tachycardia) ([place]) 11/14/2012  . ICD (implantable cardioverter-defibrillator) in place 11/14/2012  . Hyperlipidemia 11/14/2012  . Low TSH level 11/14/2012    Past Medical History: Past Medical History:  Diagnosis Date  . CAD (coronary artery disease)    a. CABG 1996: SVG to LAD,LIMA to CX,SVG to RCA  . DM (diabetes mellitus) ([place])   . HTN (hypertension)   . Hyperlipidemia   . ICD (implantable cardioverter-defibrillator) in place    a. Generator Medtronic Evera MRI [place] VR model [id number] serial number [id number]  . Ischemic cardiomyopathy    a.s/p ICD placement    Past Surgical History: Past Surgical History:  Procedure Laterality Date  . CARDIAC CATHETERIZATION  11/14/1998   severe depressed LV systolic function EF 16-10%  . CARDIAC DEFIBRILLATOR PLACEMENT  06/08/07   Medtronic  . CORONARY ARTERY BYPASS GRAFT  1996   [facility]  .  IMPLANTABLE CARDIOVERTER DEFIBRILLATOR GENERATOR CHANGE N/A 05/09/2014   Procedure: IMPLANTABLE CARDIOVERTER DEFIBRILLATOR GENERATOR CHANGE;  Surgeon: Sanda Klein, MD;  Location: [facility] CATH LAB;  Service: Cardiovascular;  Laterality: N/A;  . NM MYOCAR PERF WALL MOTION  11/21/2011   no significant ischemia    Social History: Social History   Tobacco Use  . Smoking status: Former Smoker    Last attempt to quit: 02/10/1995    Years since quitting: 22.4  . Smokeless tobacco: Never Used  Substance Use Topics  . Alcohol use: No  . Drug use: No   Additional social history:  Please also refer to relevant sections of EMR.  Family History: Family History  Family history unknown: Yes   (If not completed, MUST add something in)  Allergies and Medications: No Known Allergies No current facility-administered medications on file prior to encounter.    Current Outpatient Medications on File Prior to Encounter  Medication Sig Dispense Refill  . acetaminophen (TYLENOL) 325 MG tablet Take 2 tablets (650 mg total) by mouth every 6 (six) hours as needed for mild pain (or Fever >/= 101).    Marland Kitchen amantadine (SYMMETREL) 100 MG capsule Take 100 mg by mouth 2 (two) times daily.    Marland Kitchen amoxicillin-clavulanate (AUGMENTIN) 875-125 MG tablet Take 1 tablet by mouth every 12 (twelve) hours. For 2days (Patient not taking: Reported on 08/05/2017) 4 tablet 0  . aspirin EC  81 MG tablet Take 81 mg by mouth daily.    . carvedilol (COREG) 25 MG tablet TAKE 1 TABLET BY MOUTH TWICE A DAY WITH A MEAL 180 tablet 2  . digoxin (LANOXIN) 0.125 MG tablet Take 0.0625 mg by mouth daily.     Marland Kitchen donepezil (ARICEPT) 5 MG tablet Take 5 mg by mouth at bedtime.    Marland Kitchen doxycycline (VIBRAMYCIN) 100 MG capsule Take 100 mg by mouth 2 (two) times daily. For 10 days    . ezetimibe (ZETIA) 10 MG tablet Take 10 mg by mouth at bedtime.     . fluticasone (FLONASE) 50 MCG/ACT nasal spray Place 1 spray into both nostrils daily.    . furosemide  (LASIX) 20 MG tablet Take 20 mg by mouth daily.     . mirtazapine (REMERON) 7.5 MG tablet Take 7.5 mg by mouth at bedtime.    . niacin (NIASPAN) 500 MG CR tablet Take 500 mg by mouth at bedtime.   5  . nitroGLYCERIN (NITROSTAT) 0.4 MG SL tablet Place 0.4 mg under the tongue every 5 (five) minutes as needed for chest pain.    Marland Kitchen nystatin (NYSTATIN) powder Apply 1 g topically 3 (three) times daily.    Marland Kitchen omeprazole (PRILOSEC) 20 MG capsule Take 20 mg by mouth 2 (two) times daily.     . ramipril (ALTACE) 10 MG capsule Take 10 mg by mouth daily.    . risperiDONE (RISPERDAL) 0.25 MG tablet Take 0.25 mg by mouth 2 (two) times daily as needed (agitation).    . sertraline (ZOLOFT) 100 MG tablet Take 100 mg by mouth daily.    . simvastatin (ZOCOR) 80 MG tablet Take 80 mg by mouth at bedtime.    . sucralfate (CARAFATE) 1 G tablet Take 1 g by mouth 4 (four) times daily -  with meals and at bedtime.       Objective: BP 117/84   Pulse 71   Temp 98 F (36.7 C) (Oral)   Resp 20   Ht 5\' 7"  (1.702 m)   Wt 155 lb (70.3 kg)   SpO2 96%   BMI 24.28 kg/m  Physical Exam: General: Elderly gentleman, slow in answering questions, pleasant, able to participate in exam. Cardiac: RRR, normal heart sounds, no murmurs.  Pulses difficult to palpate given significant swelling in lower extremity bilaterally Respiratory: Minimal crackles at the bases bilaterally, no wheezes or increased work of breathing. Abdomen: soft, nontender, nondistended, no hepatic or splenomegaly, +BS Extremities: Bilateral lower extremity +1 pitting edema Skin: warm and dry, no rashes noted Neuro: alert and oriented x4, no focal deficits Psych: flat affect and mood  Labs and Imaging: CBC BMET  Recent Labs  Lab 08/05/17 1615 08/05/17 1622  WBC 6.9  --   HGB 13.8 14.3  HCT 43.1 42.0  PLT 175  --    Recent Labs  Lab 08/05/17 1615 08/05/17 1622  NA 141 143  K 3.8 3.7  CL 108 105  CO2 23  --   BUN 10 12  CREATININE 0.91 0.80   GLUCOSE 105* 103*  CALCIUM 9.0  --      Trop: <0.03 BNP: 1988.2  Urinalysis    Component Value Date/Time   COLORURINE YELLOW 08/05/2017 1615   APPEARANCEUR HAZY (A) 08/05/2017 1615   LABSPEC 1.013 08/05/2017 1615   PHURINE 5.0 08/05/2017 1615   GLUCOSEU NEGATIVE 08/05/2017 1615   HGBUR MODERATE (A) 08/05/2017 1615   BILIRUBINUR NEGATIVE 08/05/2017 1615   [place] 08/05/2017 1615  PROTEINUR NEGATIVE 08/05/2017 1615   NITRITE POSITIVE (A) 08/05/2017 1615   LEUKOCYTESUR SMALL (A) 08/05/2017 1615     Dg Chest 2 View  Result Date: 08/05/2017 CLINICAL DATA:  Chest pain. EXAM: CHEST - 2 VIEW COMPARISON:  Chest x-ray dated Jun 19, 2017. FINDINGS: The lateral view is limited due to overlapping arms. Unchanged left chest wall pacer device. Stable cardiomegaly status post CABG. Normal pulmonary vascularity. Unchanged scarring/atelectasis in the left lower lobe. No focal consolidation, pleural effusion, or pneumothorax. No acute osseous abnormality. IMPRESSION: No active cardiopulmonary disease. Electronically Signed   By: Titus Dubin M.D.   On: 08/05/2017 17:12   Ct Head Wo Contrast  Result Date: 08/05/2017 CLINICAL DATA:  Increased mental status and slurred speech with chest pain beginning this afternoon. EXAM: CT HEAD WITHOUT CONTRAST TECHNIQUE: Contiguous axial images were obtained from the base of the skull through the vertex without intravenous contrast. COMPARISON:  06/19/2017 and 05/16/2017 FINDINGS: Brain: Examination demonstrates mild stable prominence of the lateral and third ventricles. Cisterns are within normal. There is minimal chronic ischemic microvascular disease. 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 No evidence of intra-axial mass, mass effect, shift of midline structures or acute hemorrhage. No evidence of acute infarction. No change over the region of the right cerebellar pontine angle with there is a known mass likely schwannoma with widening of the adjacent  internal auditory canal. Vascular: No hyperdense vessel or unexpected calcification. Skull: Normal. Negative for fracture or focal lesion. Sinuses/Orbits: No acute finding. Other: None. IMPRESSION: No acute findings. Minimal chronic ischemic microvascular disease. 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 Electronically Signed   By: Marin Olp M.D.   On: 08/05/2017 16:44     Marjie Skiff, MD 08/05/2017, 7:34 PM PGY-2, [place] [facility] pager: [phone], text pages welcome

## 2017-08-05 NOTE — ED Notes (Signed)
Admitting MD at bedside.

## 2017-08-05 NOTE — ED Notes (Signed)
Patient transported to CT 

## 2017-08-05 NOTE — ED Notes (Addendum)
Upon speaking with pt's sister Lorriane Shire, pt had stroke 2 months ago without any deficits. Per family, pt also does not have hx of dementia.

## 2017-08-05 NOTE — ED Triage Notes (Addendum)
Pt by [facility], from home. Per EMS, Pt LSN at 1000 08/05/17. First sx noted at 1343. Pt has slurred speech, increased ams, and cp. Pt is communicating less with ems. EMS gave 324 asa. Pt reports 6/10 cp. Hx of TIA with no previous deficits. Hx CHF, diabetes and dementia - baseline able to communicate. 12 lead sinus with frequent PVCs. Pt A&Ox2.  EMS CBG 178.

## 2017-08-05 NOTE — ED Notes (Signed)
Patient in Ronnie Nelson

## 2017-08-05 NOTE — ED Notes (Signed)
Spoke to provider on treatment team. Provider stated to collect urine culture, and patient does not require NIH stroke scale ordered. Pt per baseline.

## 2017-08-05 NOTE — ED Notes (Signed)
ED Provider at bedside. 

## 2017-08-06 ENCOUNTER — Inpatient Hospital Stay (HOSPITAL_COMMUNITY): Payer: Medicare Other

## 2017-08-06 DIAGNOSIS — I340 Nonrheumatic mitral (valve) insufficiency: Secondary | ICD-10-CM

## 2017-08-06 DIAGNOSIS — R079 Chest pain, unspecified: Secondary | ICD-10-CM

## 2017-08-06 DIAGNOSIS — I5023 Acute on chronic systolic (congestive) heart failure: Secondary | ICD-10-CM

## 2017-08-06 LAB — BASIC METABOLIC PANEL
ANION GAP: 10 (ref 5–15)
BUN: 9 mg/dL (ref 8–23)
CHLORIDE: 106 mmol/L (ref 98–111)
CO2: 28 mmol/L (ref 22–32)
Calcium: 8.8 mg/dL — ABNORMAL LOW (ref 8.9–10.3)
Creatinine, Ser: 0.94 mg/dL (ref 0.61–1.24)
GFR calc Af Amer: >60 mL/min (ref >60)
Glucose, Bld: 118 mg/dL — ABNORMAL HIGH (ref 70–99)
POTASSIUM: 3.4 mmol/L — AB (ref 3.5–5.1)
Sodium: 144 mmol/L (ref 135–145)

## 2017-08-06 LAB — GLUCOSE, CAPILLARY
GLUCOSE-CAPILLARY: 115 mg/dL — AB (ref 70–99)
Glucose-Capillary: 114 mg/dL — ABNORMAL HIGH (ref 70–99)
Glucose-Capillary: 124 mg/dL — ABNORMAL HIGH (ref 70–99)
Glucose-Capillary: 129 mg/dL — ABNORMAL HIGH (ref 70–99)

## 2017-08-06 LAB — TROPONIN I: Troponin I: 0.03 ng/mL (ref <0.03)

## 2017-08-06 LAB — ECHOCARDIOGRAM COMPLETE
Height: 67 in
Weight: 2419.2 oz

## 2017-08-06 LAB — HIV ANTIBODY (ROUTINE TESTING W REFLEX): HIV Screen 4th Generation wRfx: NONREACTIVE

## 2017-08-06 LAB — T4, FREE: Free T4: 1.16 ng/dL (ref 0.82–1.77)

## 2017-08-06 MED ORDER — NITROGLYCERIN 0.4 MG SL SUBL
SUBLINGUAL_TABLET | SUBLINGUAL | Status: AC
  Filled 2017-08-06: qty 1

## 2017-08-06 MED ORDER — ROSUVASTATIN CALCIUM 20 MG PO TABS
20.0000 mg | ORAL_TABLET | Freq: Every day | ORAL | Status: DC
Start: 2017-08-06 — End: 2017-08-13
  Administered 2017-08-06 – 2017-08-12 (×7): 20 mg via ORAL
  Filled 2017-08-06 (×7): qty 1

## 2017-08-06 MED ORDER — ZINC OXIDE 40 % EX OINT
TOPICAL_OINTMENT | Freq: Two times a day (BID) | CUTANEOUS | Status: DC
  Administered 2017-08-06 – 2017-08-07 (×4): via TOPICAL
  Administered 2017-08-08: 1 via TOPICAL
  Administered 2017-08-08 – 2017-08-10 (×4): via TOPICAL
  Administered 2017-08-10: 1 via TOPICAL
  Administered 2017-08-11 – 2017-08-14 (×7): via TOPICAL
  Filled 2017-08-06: qty 57

## 2017-08-06 MED ORDER — SACUBITRIL-VALSARTAN 24-26 MG PO TABS: 1.0000 | ORAL_TABLET | Freq: Two times a day (BID) | ORAL | Status: DC

## 2017-08-06 MED ORDER — SACUBITRIL-VALSARTAN 24-26 MG PO TABS
1.0000 | ORAL_TABLET | Freq: Two times a day (BID) | ORAL | Status: DC
  Administered 2017-08-08 – 2017-08-10 (×5): 1 via ORAL
  Filled 2017-08-06 (×6): qty 1

## 2017-08-06 MED ORDER — RANOLAZINE ER 500 MG PO TB12
500.0000 mg | ORAL_TABLET | Freq: Two times a day (BID) | ORAL | Status: DC
  Administered 2017-08-06 – 2017-08-14 (×16): 500 mg via ORAL
  Filled 2017-08-06 (×17): qty 1

## 2017-08-06 MED ORDER — CEPHALEXIN 250 MG PO CAPS
250.0000 mg | ORAL_CAPSULE | Freq: Four times a day (QID) | ORAL | Status: AC
  Administered 2017-08-06 – 2017-08-12 (×26): 250 mg via ORAL
  Filled 2017-08-06 (×26): qty 1

## 2017-08-06 NOTE — Progress Notes (Signed)
  Echocardiogram 2D Echocardiogram has been performed.  Taejon Irani T Eileene Kisling 08/06/2017, 2:22 PM

## 2017-08-06 NOTE — Consult Note (Signed)
[facility] Nurse wound consult note Assessment completed in [place]. A foam sacral dressing was in place along with two Dermatherapy underpads beneath the buttocks.  The area under the foam was moist, but not wet.  Reason for Consult: Sacral wound Wound type: MASD ID POA: Yes Measurement: At the coccyx in the gluteal fold the patient had a 0.4 cm x 0.2 cm superficial opening that just needs to re-epithelialize. Wound bed: 100% pink Drainage (amount, consistency, odor) No drainage from area, no odor, no induration, no erythema. Periwound: Intact, normal color and texture. Dressing procedure/placement/frequency:  40% Desitin to the open area and along the gluteal fold twice daily. Turn every 2 hours. Monitor the wound area(s) for worsening of condition such as: Signs/symptoms of infection,  Increase in size,  Development of or worsening of odor, Development of pain, or increased pain at the affected locations.  Notify the medical team if any of these develop.  Thank you for the consult.  Discussed plan of care with the patient.  [place] nurse will not follow at this time.  Please re-consult the [facility] team if needed.  Val Riles, RN, MSN, CWOCN, CNS-BC, pager [phone]

## 2017-08-06 NOTE — Discharge Summary (Signed)
[facility] Discharge Summary  Patient name: Ronnie Nelson Medical record number: [id number] Date of birth: [birth year] Age: 72 y.o. Gender: male Date of Admission: 08/05/2017  Date of Discharge: 08/14/2017 Admitting Physician: Leeanne Rio, MD  Primary Care Provider: Coy Saunas, MD Consultants: cardiology  Indication for Hospitalization: chest pain  Discharge Diagnoses/Problem List:  1. Atypical chest pain 2. Acute/chronic HFrEF 3. Urinary Tract Infection 4. Dementia  Disposition: Home Hospice  Discharge Condition: Stable  Discharge Exam:  Physical Exam: General: 72 yo male, in NAD HEENT: NCAT Cardio: RRR no m/r/g Lungs: CTAB, normal effort, no crackles Abdomen: Soft, non-tender, + bowel sounds Extremities: No edema Skin: warm and dry  Brief Hospital Course:  Chest pain 72 year old who presented on 6/26 due to chest pain for previous 2 days. He was admitted for a chest pain rule out. Initial troponin was negative and known RBBB on ekg. Cardiology was consulted who held his home ace-inhibitor and started entresto for him. They also switched from crestor to high dose lipitor. For is chest pain they added ranolazine. They did not feel like he was a candidate for catheterization due to his advanced dementia. On 6/29 they signed off. Patient had repeat echo which showed worsening EF of 25% with diffuse hypokinesis. A palliative consult was placed 2/2 patient's disease burden. Decision was made by daughter to proceed with home hospice care.    UTI Patient found to have 100k CFU ecoli on ua. He did have a UTI noted a couple of days prior by his pcp and was started on doxycycline for unknown reasons. He received ceftriaxone from 6/26-6/27. Keflex was started when sensitivities resulted on 6/27. He completed a 7 day course of Keflex on 08/12/2017.  HFrEF Echo as above. Cardiology started entresto while admitted. To continue his furosemide 20mg  daily,  digoxin, coreg, entresto. Arlyce Harman likely not started due to NYHA class I.  Patient developed hypotension during hospitalization and decision was made to hold entresto, coreg, and lasix until it improved.  After receiving 250cc bolus, hypotension resolved and Coreg was restarted at 6.25mg  BID.  Patient remained normotensive.  Symptom driven care recommended at this point, with addition of Lasix as needed for comfort.  Dementia Patient noted to have been significantly affected by the passing of his wife 10/2016 and experienced a 20 pound weight loss since this time.  He was started on Sertraline and mirtazapine as outpatient.  Throughout hospital course, patient has continued to be oriented to self only with impaired cognition and memory.  Following palliative consult, his daughter made the decision to focus on comfort care and not aggressive medical interventions.  Mirtazapine was discontinued for this reason.      Issues for Follow Up:  1. Continue symptom driven care for patient  Significant Procedures: None  Significant Labs and Imaging:  Recent Labs  Lab 08/09/17 0650 08/11/17 0910 08/12/17 0615  WBC 7.6 9.2 8.2  HGB 15.4 14.5 14.6  HCT 46.4 44.2 44.6  PLT 173 194 215   Recent Labs  Lab 08/09/17 0650 08/11/17 0449 08/12/17 0615  NA 140 141 137  K 3.4* 3.1* 4.3  CL 104 102 104  CO2 25 28 23   GLUCOSE 76 82 69*  BUN 7* 7* 8  CREATININE 0.90 0.98 0.95  CALCIUM 8.6* 8.9 8.6*  ALKPHOS 70  --   --   AST 19  --   --   ALT 15  --   --  ALBUMIN 3.0*  --   --    Echocardiogram  LV EF: 20% -   25%  ------------------------------------------------------------------- Indications:      CHF - 428.0.  ------------------------------------------------------------------- Study Conclusions  - Left ventricle: The cavity size was moderately dilated. Wall   thickness was increased in a pattern of mild LVH. Systolic   function was severely reduced. The estimated ejection fraction   was  in the range of 20% to 25%. Diffuse hypokinesis. There is   akinesis of the inferior myocardium. Features are consistent with   a pseudonormal left ventricular filling pattern, with concomitant   abnormal relaxation and increased filling pressure (grade 2   diastolic dysfunction). - Aortic valve: Trileaflet; mildly thickened, mildly calcified   leaflets. - Mitral valve: There was moderate regurgitation. - Left atrium: The atrium was severely dilated. Volume/bsa, ES,   (1-plane Simpson&'s, A2C): 53.1 ml/m^2. - Right ventricle: Pacer wire or catheter noted in right ventricle. - Pulmonic valve: There was moderate regurgitation. - Pulmonary arteries: Systolic pressure was mildly increased. PA   peak pressure: 36 mm Hg (S).  ------------------------------------------------------------------- Study data:  No prior study was available for comparison.  Study status:  Routine.  Procedure:  Transthoracic echocardiography. Image quality was adequate.  Study completion:  There were no complications.          Transthoracic echocardiography.  M-mode, complete 2D, spectral Doppler, and color Doppler.  Birthdate: Patient birthdate: 06-16-1945.  Age:  Patient is 72 yr old.  Sex: Gender: male.    BMI: 23.7 kg/m^2.  Blood pressure:     126/94 Patient status:  Inpatient.  Study date:  Study date: 08/06/2017. Study time: 01:55 PM.  Location:  Bedside.  -------------------------------------------------------------------  ------------------------------------------------------------------- Left ventricle:  The cavity size was moderately dilated. Wall thickness was increased in a pattern of mild LVH. Systolic function was severely reduced. The estimated ejection fraction was in the range of 20% to 25%. Diffuse hypokinesis.  Regional wall motion abnormalities:   There is akinesis of the inferior myocardium. Features are consistent with a pseudonormal left ventricular filling pattern, with concomitant  abnormal relaxation and increased filling pressure (grade 2 diastolic dysfunction).  ------------------------------------------------------------------- Aortic valve:   Trileaflet; mildly thickened, mildly calcified leaflets. Mobility was not restricted.  Doppler:  Transvalvular velocity was within the normal range. There was no stenosis. There was no regurgitation.  ------------------------------------------------------------------- Aorta:  Aortic root: The aortic root was normal in size.  ------------------------------------------------------------------- Mitral valve:   Structurally normal valve.   Mobility was not restricted.  Doppler:  Transvalvular velocity was within the normal range. There was no evidence for stenosis. There was moderate regurgitation.    Peak gradient (D): 3 mm Hg.  ------------------------------------------------------------------- Left atrium:  The atrium was severely dilated.  ------------------------------------------------------------------- Right ventricle:  The cavity size was normal. Wall thickness was normal. Pacer wire or catheter noted in right ventricle. Systolic function was normal.  ------------------------------------------------------------------- Pulmonic valve:   Poorly visualized.  Structurally normal valve. Cusp separation was normal.  Doppler:  Transvalvular velocity was within the normal range. There was no evidence for stenosis. There was moderate regurgitation.  ------------------------------------------------------------------- Tricuspid valve:   Structurally normal valve.    Doppler: Transvalvular velocity was within the normal range. There was mild regurgitation.  ------------------------------------------------------------------- Pulmonary artery:   The main pulmonary artery was normal-sized. Systolic pressure was mildly increased.  ------------------------------------------------------------------- Right atrium:   The atrium was normal in size.  ------------------------------------------------------------------- Pericardium:  There was no pericardial effusion.  ------------------------------------------------------------------- Systemic veins: Inferior  vena cava: The vessel was normal in size.    Results/Tests Pending at Time of Discharge: None  Discharge Medications:  Allergies as of 08/14/2017   No Known Allergies     Medication List    STOP taking these medications   amantadine 100 MG capsule Commonly known as:  SYMMETREL   amoxicillin-clavulanate 875-125 MG tablet Commonly known as:  AUGMENTIN   aspirin EC 81 MG tablet   donepezil 5 MG tablet Commonly known as:  ARICEPT   doxycycline 100 MG capsule Commonly known as:  VIBRAMYCIN   ezetimibe 10 MG tablet Commonly known as:  ZETIA   fluticasone 50 MCG/ACT nasal spray Commonly known as:  FLONASE   furosemide 20 MG tablet Commonly known as:  LASIX   mirtazapine 7.5 MG tablet Commonly known as:  REMERON   niacin 500 MG CR tablet Commonly known as:  NIASPAN   nystatin powder Generic drug:  nystatin   ramipril 10 MG capsule Commonly known as:  ALTACE   risperiDONE 0.25 MG tablet Commonly known as:  RISPERDAL   simvastatin 80 MG tablet Commonly known as:  ZOCOR   sucralfate 1 g tablet Commonly known as:  CARAFATE     TAKE these medications   acetaminophen 325 MG tablet Commonly known as:  TYLENOL Take 2 tablets (650 mg total) by mouth every 6 (six) hours as needed for mild pain (or Fever >/= 101).   carvedilol 6.25 MG tablet Commonly known as:  COREG Take 1 tablet (6.25 mg total) by mouth 2 (two) times daily with a meal. What changed:    medication strength  See the new instructions.   digoxin 0.125 MG tablet Commonly known as:  LANOXIN Take 0.0625 mg by mouth daily.   nitroGLYCERIN 0.4 MG SL tablet Commonly known as:  NITROSTAT Place 0.4 mg under the tongue every 5 (five) minutes as needed for  chest pain.   omeprazole 20 MG capsule Commonly known as:  PRILOSEC Take 20 mg by mouth 2 (two) times daily.   sertraline 100 MG tablet Commonly known as:  ZOLOFT Take 100 mg by mouth daily.       Discharge Instructions: Please refer to Patient Instructions section of EMR for full details.  Patient was counseled important signs and symptoms that should prompt return to medical care, changes in medications, dietary instructions, activity restrictions, and follow up appointments.   Follow-Up Appointments: Follow-up Information    Coy Saunas, MD.   Specialty:  Family Medicine Why:  Call office to make appointment Contact information: [address] [place] [id number] [place], [place] Follow up.   Specialty:  [facility] Why:  They will do your home hospice at your home Contact information: [place] [address] [place], Bernita Raisin, DO 08/14/2017, 12:05 PM PGY-1, [place]

## 2017-08-06 NOTE — Progress Notes (Signed)
Family Medicine Teaching Service Daily Progress Note Intern Pager: [phone]  Patient name: Ronnie Nelson Medical record number: [id number] Date of birth: 1945-02-19 Age: 72 y.o. Gender: male  Primary Care Provider: Coy Saunas, MD Consultants: Cardiology Code Status: Full  Pt Overview and Major Events to Date:  Ronnie Nelson is a 72 y.o. male with a past medical history significant for CAD status post CABG, hypertension, ischemic cardiomyopathy, dementia, depression, type 2 diabetes (unclear), stroke who presents today complaining of chest pain and found to have CHF exacerbation.  Assessment and Plan:   Chest pain, acute on chronic Patient has improved chest pain this am, no complaints. Patient described pain as a dull left-sided without any radiation. Shortness of breath also reported by patient in the past 2 days. In the ED initial troponin was negative.  EKG normal sinus rhythm with evidence of right bundle branch block.  Heart score 6. Atypical presentation for chest pain. Patient has known history of CAD with extensive disease burden and LAD, circumflex and right coronary. Patient also has known history of ischemic cardiomyopathy with an ICD in place.  No recent SVT reported by defibrillator per cardiology.  Patient has elevated BNP with evidence of volume overload on exam.  Chest pain could possibly be secondary to progression of CAD versus CHF exacerbation. --Admit to telemetry --Consult Cardiology, appreciate recs --Cont Lasix 40 mg IV --Repeat echocardiogram --Daily weights, strict I's and O's: 845ml out since admission with net -271ml --Nitroglycerin if BP allow or as needed  #UTI UA on admission showed small leukocyte esterase positive nitrite and moderate hemoglobin.  Patient appears to be treated by PCP with doxycycline which is an unusual antibiotic for urinary tract infection.  Antibiotic choice could be due to a concern for possible prostatitis as a source of urinary tract  infection. S/p CTX 1g IV in ED. --Start Keflex 250mg  q6 for total of 7 days --Follow-up on urine culture  #HFrEF Patient with a history of heart failure with reduced ejection fraction.  Last echo on April 2018 showed an EF of 20-25% with grade 3 diastolic dysfunction.  Patient also had mild to moderate dilated LA.  BNP on admission elevated at 1980.2.  Exam with mild crackles at the bases as well as lower extremity edema +1 pitting.  Weight on discharge at last admission (06/24/2017) was 155 pounds.  Patient has a symmetry on admission despite clear volume overload.  No evidence of  pulmonary edema or vascular congestion on chest x-ray.  Patient has recently lost 20 pounds.  Weight back in December 2018 at cardiologist office was 182 pounds.  Unclear dry weight. --Cardiology consult today; appreciate recs --Repeat echo --Diuresis as above --Patient usually on furosemide 20 mg daily  --Continue ramipril 10 mg daily --Continue coreg 25 mg twice daily --Continue digoxin 0. 8099 daily --Consider starting Spironolactone  --Strict I's/O's --Daily weights  --Delene Loll transition considered by cardiology  #Nonsustained ventricular tachycardia (NSVT) Patient is followed by Dr.Croitoru and currently has ICD.  Patient has had a defibrillator since 2009.  Patient has had reported VT by device with no shock required.  Last pacing back in 2011.  Patient has frequent defibrillator check with cardiology. --Placed on telemetry  #CAD s/p CABG Patient presents today with left-sided chest pain described as dull.  Patient received aspirin 325 mg.  Chest pain has resolved.  Initial EKG showed T wave flattening in lateral leads which resolved with repeat EKG. initial troponin negative.  Patient has extensive scarring in  the distribution of his distal LAD circumflex and right coronary artery. --Trend troponin - flat --Cont telemetry --Consult to Cardiology --Continue aspirin 81 mg  #Ischemic  cardiomyopathy Patient has ICD in place with known reduced ejection fraction.  Closely followed by cardiology.   #History of subclinical hyperthyroidism Patient has history of low TSH with normal T4 consistent with clinical hypothyroidism.  Given cardiac history tendency for tachycardia/arrhythmias monitoring is warranted. T4 normal --Outpatient follow up  #Hypertension SBP since admission 104-126 and DBP 74-97.  Well-controlled on current regimen. --Continue ramipril 10 mg daily --Continue Coreg 25 mg twice daily  #Right occipital stroke Patient has a recent history of occipital stroke for which she was hospitalized April 2018.  Developed some left-sided deficit in addition to confusion.  Patient was sent to rehab.  We will continue to monitor.  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 Patient has had issues with bilateral hearing loss followed by PCP.  Patient was seen by ENT in early May, with recommendation for cardiology clearance prior to discussing surgery versus CyberKnife therapy. --Defer to outpatient management  #Prediabetes HgbA1c of 5/8  No glycemic control medications. --Order A1c --Sensitive SSI --CBG AC nightly  #Hyperlipidemia --Continue ezetimibe 10 mg daily, niacin 500 mg daily, simvastatin 80 mg  #Dementia Continue home regimen --Amantadine 100 mg twice daily --Donepezil 500 mg daily nightly --Risperidone 0.25 mg twice daily   #Depression Patient recently lost with back in September 2018.  Patient has been significantly affected by significant office loss.  In the past month patient has lost 20 pounds and was noted to be emotional multiple providers visit.  Recently started on SSRI. --Continue mirtazapine 7.5 mg nightly --Continue sertraline 100 mg daily  #Sacral decubitus ulcer, stage II --Consult wound care  #GERD --Continue omeprazole  20 mg bid  --Continue ezetimibe 10  mg qhs --Continue sulcrafate 1 g qid  FEN/GI: Heart Healthy PPx: Lovenox  Disposition: inpatient  Subjective:  Patient is able to respond to name but does not say much else and is somnolent. He denies any pain or discomfort this am. He is able to follow commands and is resting comfortably in bed.  Objective: Temp:  [98 F (36.7 C)-98.6 F (37 C)] 98.6 F (37 C) (06/27 0508) Pulse Rate:  [25-141] 74 (06/27 0508) Resp:  [11-26] 18 (06/27 0508) BP: (104-126)/(74-97) 126/94 (06/27 0508) SpO2:  [89 %-100 %] 100 % (06/27 0508) Weight:  [151 lb 3.2 oz (68.6 kg)-155 lb 10.3 oz (70.6 kg)] 151 lb 3.2 oz (68.6 kg) (06/27 0508) Physical Exam: Gen: Alert and Oriented x 3, NAD HEENT: Normocephalic, atraumatic, PERRLA, EOMI CV: RRR, 3/6 murmurs, normal S1, S2 split with S4 gallop, +2 pulses dorsalis pedis bilaterally Resp: decreased breath sounds bilaterally, no wheezing, rales, or rhonchi, comfortable work of breathing Abd: non-distended, non-tender, soft, +bs in all four quadrants MSK: Moves all four extremities Ext: no clubbing, cyanosis, trace edema Skin: warm, dry, intact, no rashes  Laboratory: Recent Labs  Lab 08/05/17 1615 08/05/17 1622  WBC 6.9  --   HGB 13.8 14.3  HCT 43.1 42.0  PLT 175  --    Recent Labs  Lab 08/05/17 1615 08/05/17 1622 08/06/17 0735  NA 141 143 144  K 3.8 3.7 3.4*  CL 108 105 106  CO2 23  --  28  BUN 10 12 9   CREATININE 0.91 0.80 0.94  CALCIUM 9.0  --  8.8*  PROT 6.4*  --   --  BILITOT 1.1  --   --   ALKPHOS 73  --   --   ALT 22  --   --   AST 23  --   --   GLUCOSE 105* 103* 118*   Troponin I: <0.03, 0.03, <0.03 TSH: 0.140 T4: 1.16 HgbA1c: 5.8 Mag: 1.9 Phosphorous: 3.3 U/A: Hgb mod, Pos Nitrites,small Leuks, many bacteria UCx: pending BNP: 1988.2  Imaging/Diagnostic Tests:  CXR IMPRESSION: No active cardiopulmonary disease.  CT Head IMPRESSION: No acute findings.  Minimal chronic ischemic microvascular disease. Small old  left occipital infarct.  Stable known right CP angle mass.  Nuala Alpha, DO 08/06/2017, 9:23 AM PGY-1, [place]

## 2017-08-06 NOTE — Progress Notes (Signed)
CRITICAL VALUE ALERT  Critical Value:  Troponin 0.03  Date & Time Notied:  08/06/17 0350  Provider Notified:  yes  Orders Received/Actions taken: No new orders given.

## 2017-08-06 NOTE — Progress Notes (Signed)
Patient complaining of "chest pain."  When asked where it hurts, he points to right side of chest/ribs.  Vital signs stable, tylenol given approximately 20 minutes ago.  MD made aware and came to see patient at the bedside.  Will continue to monitor.

## 2017-08-06 NOTE — Consult Note (Signed)
CARDIOLOGY CONSULT NOTE       Patient ID: Ronnie Nelson MRN: [id number] DOB/AGE: 03-25-1945 72 y.o.  Admit date: 08/05/2017 Referring Physician: Ardelia Mems Primary Physician: Coy Saunas, MD Primary Cardiologist: Croitoru Reason for Consultation: Chest Pain  Active Problems:   CHF exacerbation [facility])   HPI:  72 y.o. CABG in 1996 with SVG to LAD, LIMA to circumflex and SVG RCA. Ischemic DCM with EF 20-25% by echo  This admission 08/06/17 Admitted by family medicine for SSCP and CHF exacerbation yesterday. Given 2 doses of iv lasix  BNP 1988. He appears to have significant dementia and does not know where he is. 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 He has atypical SSCP that he indicates for the last 48 hours. To me he indicates having it during interview sharp in center of chest. 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 CAD (coronary artery disease)    a. CABG 1996: SVG to LAD,LIMA to CX,SVG to RCA  . DM (diabetes mellitus) ([place])   . HTN (hypertension)   . Hyperlipidemia   . ICD (implantable cardioverter-defibrillator) in place    a. Generator Medtronic Evera MRI [place] VR model [id number] serial number [id number]  . Ischemic cardiomyopathy    a.s/p ICD placement    Family History  Family history unknown: Yes    Social History   Socioeconomic History  . Marital status: Married    Spouse name: Not on file  . Number of children: Not on file  . Years of education: Not on file  . Highest education level: Not on file  Occupational History  . Not on file  Social Needs  . Financial  resource strain: Not on file  . Food insecurity:    Worry: Not on file    Inability: Not on file  . Transportation needs:    Medical: Not on file    Non-medical: Not on file  Tobacco Use  . Smoking status: Former Smoker    Last attempt to quit: 02/10/1995    Years since quitting: 22.5  . Smokeless tobacco: Never Used  Substance and Sexual Activity  . Alcohol use: No  . Drug use: No  . Sexual activity: Not on file  Lifestyle  . Physical activity:    Days per week: Not on file    Minutes per session: Not on file  . Stress: Not on file  Relationships  . Social connections:    Talks on phone: Not on file    Gets together: Not on file    Attends religious service: Not on file    Active member of club or organization: Not on file    Attends meetings of clubs or organizations: Not on file    Relationship status: Not on file  . Intimate partner violence:    Fear of current or ex partner: Not on file    Emotionally abused: Not on file    Physically abused: Not on file    Forced sexual activity: Not on file  Other Topics Concern  . Not  on file  Social History Narrative  . Not on file    Past Surgical History:  Procedure Laterality Date  . CARDIAC CATHETERIZATION  11/14/1998   severe depressed LV systolic function EF 02-63%  . CARDIAC DEFIBRILLATOR PLACEMENT  06/08/07   Medtronic  . CORONARY ARTERY BYPASS GRAFT  1996   [facility]  . IMPLANTABLE CARDIOVERTER DEFIBRILLATOR GENERATOR CHANGE N/A 05/09/2014   Procedure: IMPLANTABLE CARDIOVERTER DEFIBRILLATOR GENERATOR CHANGE;  Surgeon: Sanda Klein, MD;  Location: [facility] CATH [facility];  Service: Cardiovascular;  Laterality: N/A;  . NM MYOCAR PERF WALL MOTION  11/21/2011   no significant ischemia     . amantadine  100 mg Oral BID  . aspirin EC  81 mg Oral Daily  . carvedilol  25 mg Oral BID WC  . cephALEXin  250 mg Oral Q6H  . digoxin  0.0625 mg Oral Daily  . donepezil  5 mg Oral QHS  . enoxaparin (LOVENOX) injection  40 mg  Subcutaneous Q24H  . ezetimibe  10 mg Oral QHS  . insulin aspart  0-9 Units Subcutaneous TID WC  . liver oil-zinc oxide   Topical BID  . mirtazapine  7.5 mg Oral QHS  . nitroGLYCERIN      . pantoprazole  40 mg Oral BID  . ramipril  10 mg Oral Daily  . rosuvastatin  20 mg Oral q1800  . sertraline  100 mg Oral Daily     Physical Exam: BP 108/89   Pulse 67   Temp (!) 97 F (36.1 C) (Oral)   Resp 17   Ht 5\' 7"  (1.702 m)   Wt 151 lb 3.2 oz (68.6 kg)   SpO2 94%   BMI 23.68 kg/m   Not oriented poor memory  Chronically ill black male  HEENT: normal Neck supple with no adenopathy JVP normal no bruits no thyromegaly Lungs clear with no wheezing and good diaphragmatic motion Heart:  S1/S2 SEM  murmur, no rub, gallop or click PMI normal Abdomen: benighn, BS positve, no tenderness, no AAA no bruit.  No HSM or HJR condom catheter  Distal pulses intact with no bruits No edema Neuro non-focal Skin warm and dry No muscular weakness    Labs:   Lab Results  Component Value Date   WBC 6.9 08/05/2017   HGB 14.3 08/05/2017   HCT 42.0 08/05/2017   MCV 93.3 08/05/2017   PLT 175 08/05/2017    Recent Labs  Lab 08/05/17 1615  08/06/17 0735  NA 141   < > 144  K 3.8   < > 3.4*  CL 108   < > 106  CO2 23  --  28  BUN 10   < > 9  CREATININE 0.91   < > 0.94  CALCIUM 9.0  --  8.8*  PROT 6.4*  --   --   BILITOT 1.1  --   --   ALKPHOS 73  --   --   ALT 22  --   --   AST 23  --   --   GLUCOSE 105*   < > 118*   < > = values in this interval not displayed.      Radiology: Dg Chest 2 View  Result Date: 08/05/2017 CLINICAL DATA:  Chest pain. EXAM: CHEST - 2 VIEW COMPARISON:  Chest x-ray dated Jun 19, 2017. FINDINGS: The lateral view is limited due to overlapping arms. Unchanged left chest wall pacer device. Stable cardiomegaly status post CABG. Normal pulmonary vascularity. Unchanged  scarring/atelectasis in the left lower lobe. No focal consolidation, pleural effusion, or  pneumothorax. No acute osseous abnormality. IMPRESSION: No active cardiopulmonary disease. Electronically Signed   By: Titus Dubin M.D.   On: 08/05/2017 17:12   Ct Head Wo Contrast  Result Date: 08/05/2017 CLINICAL DATA:  Increased mental status and slurred speech with chest pain beginning this afternoon. EXAM: CT HEAD WITHOUT CONTRAST TECHNIQUE: Contiguous axial images were obtained from the base of the skull through the vertex without intravenous contrast. COMPARISON:  06/19/2017 and 05/16/2017 FINDINGS: Brain: Examination demonstrates mild stable prominence of the lateral and third ventricles. Cisterns are within normal. There is minimal chronic ischemic microvascular disease. 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 No evidence of intra-axial mass, mass effect, shift of midline structures or acute hemorrhage. No evidence of acute infarction. 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 Vascular: No hyperdense vessel or unexpected calcification. Skull: Normal. Negative for fracture or focal lesion. Sinuses/Orbits: No acute finding. Other: None. IMPRESSION: No acute findings. Minimal chronic ischemic microvascular disease. 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 Electronically Signed   By: Marin Olp M.D.   On: 08/05/2017 16:44    EKG: SR PVC RBBB old ILMI    ASSESSMENT AND PLAN:   Chest Pain: atypical with no acute ECG changes and r/o despite 48 hours of sharp resting pain. Has old bypass grafts but given patients overall shape atypical nature of current pain would adjust meds and not recommend cath. Add Ranexa   CHF:  Currently not volume overloaded on exam despite BNP Cr ok and BP not too low Reasonable to start entresto after holding ACE for 36 hours continue coreg   AICD:  No d/c NSVT on beta blocker  HLD:  Changed to crestor from high dose  lipitor   Signed: Jenkins Rouge 08/06/2017, 4:09 PM

## 2017-08-07 LAB — BASIC METABOLIC PANEL
Anion gap: 11 (ref 5–15)
BUN: 10 mg/dL (ref 8–23)
CHLORIDE: 104 mmol/L (ref 98–111)
CO2: 26 mmol/L (ref 22–32)
CREATININE: 0.91 mg/dL (ref 0.61–1.24)
Calcium: 8.8 mg/dL — ABNORMAL LOW (ref 8.9–10.3)
GFR calc Af Amer: >60 mL/min (ref >60)
GFR calc non Af Amer: >60 mL/min (ref >60)
GLUCOSE: 89 mg/dL (ref 70–99)
POTASSIUM: 3.5 mmol/L (ref 3.5–5.1)
Sodium: 141 mmol/L (ref 135–145)

## 2017-08-07 LAB — CBC
HEMATOCRIT: 42 % (ref 39.0–52.0)
Hemoglobin: 13.9 g/dL (ref 13.0–17.0)
MCH: 29.8 pg (ref 26.0–34.0)
MCHC: 33.1 g/dL (ref 30.0–36.0)
MCV: 90.1 fL (ref 78.0–100.0)
PLATELETS: 191 10*3/uL (ref 150–400)
RBC: 4.66 MIL/uL (ref 4.22–5.81)
RDW: 16 % — ABNORMAL HIGH (ref 11.5–15.5)
WBC: 7.2 10*3/uL (ref 4.0–10.5)

## 2017-08-07 LAB — DIGOXIN LEVEL: Digoxin Level: <0.2 ng/mL — ABNORMAL LOW (ref 0.8–2.0)

## 2017-08-07 LAB — GLUCOSE, CAPILLARY
GLUCOSE-CAPILLARY: 111 mg/dL — AB (ref 70–99)
Glucose-Capillary: 73 mg/dL (ref 70–99)
Glucose-Capillary: 104 mg/dL — ABNORMAL HIGH (ref 70–99)

## 2017-08-07 MED ORDER — FUROSEMIDE 20 MG PO TABS
20.0000 mg | ORAL_TABLET | Freq: Every day | ORAL | Status: DC
  Administered 2017-08-07 – 2017-08-10 (×4): 20 mg via ORAL
  Filled 2017-08-07 (×5): qty 1

## 2017-08-07 NOTE — Evaluation (Signed)
Physical Therapy Evaluation Patient Details Name: Ronnie Nelson MRN: [id number] DOB: [birth year] Today's Date: 08/07/2017   History of Present Illness  Pt is a 72 y.o. male presenting with chest pain and found to have CHF exacerbation. PMHx: CAD s/p CABG, HTN, Ischemic cardiomyopathy, Dementia, Depression, DM2, CVA.     Clinical Impression  Pt admitted with above diagnosis. Pt currently with functional limitations due to the deficits listed below (see PT Problem List). Pt not oriented with hx of dementia, unable to provide reliable history. Reports to nursing w/c bound, reports to PT walks without AD and lives with sister. Upon eval pt following commands 50% of the time, able to supine to sit with supervision and cues, however unable to stand despite max A. Posterior lean standing and collapsing back onto bed, in part due to confusion. No family present to determine baseline or level of assistance at home. Will progress as tolerated.  Pt will benefit from skilled PT to increase their independence and safety with mobility to allow discharge to the venue listed below.       Follow Up Recommendations SNF;Supervision/Assistance - 24 hour    Equipment Recommendations  (TBD next venue)    Recommendations for Other Services       Precautions / Restrictions Precautions Precautions: Fall Restrictions Weight Bearing Restrictions: No      Mobility  Bed Mobility               General bed mobility comments: Pt OOB in chair upon arrival  Transfers Overall transfer level: Needs assistance Equipment used: Rolling walker (2 wheeled) Transfers: Sit to/from Stand           General transfer comment: trialed standing x4, pt max A to stand unable to get stand posterior lean backwards onto bed each time.   Ambulation/Gait             General Gait Details: unable  Stairs            Wheelchair Mobility    Modified Rankin (Stroke Patients Only)       Balance Overall  balance assessment: Needs assistance Sitting-balance support: Feet supported;Bilateral upper extremity supported Sitting balance-Leahy Scale: Poor   Postural control: Posterior lean Standing balance support: Bilateral upper extremity supported Standing balance-Leahy Scale: Zero                               Pertinent Vitals/Pain Pain Assessment: No/denies pain    Home Living Family/patient expects to be discharged to:: Skilled nursing facility                 Additional Comments: Pt unable to provide home set up or PLOF information. No family present.    Prior Function           Comments: Pt unable to provide home set up or PLOF information. No family present.     Hand Dominance        Extremity/Trunk Assessment   Upper Extremity Assessment Upper Extremity Assessment: Generalized weakness    Lower Extremity Assessment Lower Extremity Assessment: Difficult to assess due to impaired cognition;Generalized weakness       Communication   Communication: Other (comment)  Cognition Arousal/Alertness: Awake/alert Behavior During Therapy: Flat affect Overall Cognitive Status: No family/caregiver present to determine baseline cognitive functioning Area of Impairment: Orientation;Memory;Following commands;Safety/judgement;Problem solving                 Orientation Level:  Disoriented to;Place;Time;Situation   Memory: Decreased short-term memory Following Commands: Follows one step commands inconsistently;Follows one step commands with increased time Safety/Judgement: Decreased awareness of safety;Decreased awareness of deficits   Problem Solving: Slow processing;Decreased initiation;Difficulty sequencing;Requires verbal cues;Requires tactile cues General Comments: Pt with known hx of dementia, unsure of baseline.      General Comments      Exercises     Assessment/Plan    PT Assessment Patient needs continued PT services  PT Problem  List Decreased strength;Decreased range of motion;Decreased activity tolerance;Decreased balance;Decreased mobility;Decreased coordination;Decreased cognition;Pain;Decreased safety awareness       PT Treatment Interventions DME instruction;Gait training;Stair training;Functional mobility training;Therapeutic activities;Therapeutic exercise;Balance training    PT Goals (Current goals can be found in the Care Plan section)  Acute Rehab PT Goals Patient Stated Goal: none stated PT Goal Formulation: With patient Time For Goal Achievement: 08/14/17 Potential to Achieve Goals: Good    Frequency Min 2X/week   Barriers to discharge        Co-evaluation               AM-PAC PT "6 Clicks" Daily Activity  Outcome Measure Difficulty turning over in bed (including adjusting bedclothes, sheets and blankets)?: A Little Difficulty moving from lying on back to sitting on the side of the bed? : A Little Difficulty sitting down on and standing up from a chair with arms (e.g., wheelchair, bedside commode, etc,.)?: A Little Help needed moving to and from a bed to chair (including a wheelchair)?: Total Help needed walking in hospital room?: Total Help needed climbing 3-5 steps with a railing? : Total 6 Click Score: 12    End of Session Equipment Utilized During Treatment: Gait belt Activity Tolerance: Patient tolerated treatment well Patient left: in bed;with call bell/phone within reach;with bed alarm set;with nursing/sitter in room Nurse Communication: Mobility status PT Visit Diagnosis: Unsteadiness on feet (R26.81);Other abnormalities of gait and mobility (R26.89);Repeated falls (R29.6);Muscle weakness (generalized) (M62.81)    Time: 1700-1720 PT Time Calculation (min) (ACUTE ONLY): 20 min   Charges:   PT Evaluation $PT Eval Moderate Complexity: 1 Mod     PT G Codes:        Reinaldo Berber, PT, DPT Acute Rehab Services Pager: [phone]    Reinaldo Berber 08/07/2017, 5:55  PM

## 2017-08-07 NOTE — Progress Notes (Signed)
Progress Note  Patient Name: Ronnie Nelson Date of Encounter: 08/07/2017  Primary Cardiologist: No primary care provider on file.   Subjective   No dyspnea still complains of some atypical chest pain   Inpatient Medications    Scheduled Meds: . amantadine  100 mg Oral BID  . aspirin EC  81 mg Oral Daily  . carvedilol  25 mg Oral BID WC  . cephALEXin  250 mg Oral Q6H  . digoxin  0.0625 mg Oral Daily  . donepezil  5 mg Oral QHS  . enoxaparin (LOVENOX) injection  40 mg Subcutaneous Q24H  . ezetimibe  10 mg Oral QHS  . insulin aspart  0-9 Units Subcutaneous TID WC  . liver oil-zinc oxide   Topical BID  . mirtazapine  7.5 mg Oral QHS  . pantoprazole  40 mg Oral BID  . ranolazine  500 mg Oral BID  . rosuvastatin  20 mg Oral q1800  . sacubitril-valsartan  1 tablet Oral BID  . sertraline  100 mg Oral Daily   Continuous Infusions:  PRN Meds: acetaminophen **OR** acetaminophen   Vital Signs    Vitals:   08/06/17 2345 08/07/17 0216 08/07/17 0404 08/07/17 0530  BP: 118/86  (!) 142/109 (!) 140/97  Pulse: 74  84 82  Resp: 18  18   Temp: 98.2 F (36.8 C)  98.6 F (37 C)   TempSrc: Oral  Oral   SpO2: 94%  99%   Weight:  151 lb 7.3 oz (68.7 kg)    Height:        Intake/Output Summary (Last 24 hours) at 08/07/2017 0814 Last data filed at 08/06/2017 1700 Gross per 24 hour  Intake 240 ml  Output 1100 ml  Net -860 ml   Filed Weights   08/05/17 2045 08/06/17 0508 08/07/17 0216  Weight: 155 lb 10.3 oz (70.6 kg) 151 lb 3.2 oz (68.6 kg) 151 lb 7.3 oz (68.7 kg)    Telemetry    SR PVC 08/07/2017   ECG    SR old ILMI PVC no acute changes  - Personally Reviewed  Physical Exam  Elderly black male  Dementia  GEN: No acute distress.   Neck: No JVD Cardiac: RRR, no murmurs, rubs, or gallops.  Respiratory: Clear to auscultation bilaterally. GI: Soft, nontender, non-distended  MS: No edema; No deformity. Neuro:  Nonfocal  Psych: Normal affect   Labs     Chemistry Recent Labs  Lab 08/05/17 1615 08/05/17 1622 08/06/17 0735  NA 141 143 144  K 3.8 3.7 3.4*  CL 108 105 106  CO2 23  --  28  GLUCOSE 105* 103* 118*  BUN 10 12 9   CREATININE 0.91 0.80 0.94  CALCIUM 9.0  --  8.8*  PROT 6.4*  --   --   ALBUMIN 3.5  --   --   AST 23  --   --   ALT 22  --   --   ALKPHOS 73  --   --   BILITOT 1.1  --   --   GFRNONAA >60  --  >60  GFRAA >60  --  >60  ANIONGAP 10  --  10     Hematology Recent Labs  Lab 08/05/17 1615 08/05/17 1622 08/07/17 0720  WBC 6.9  --  7.2  RBC 4.62  --  4.66  HGB 13.8 14.3 13.9  HCT 43.1 42.0 42.0  MCV 93.3  --  90.1  MCH 29.9  --  29.8  MCHC 32.0  --  33.1  RDW 16.2*  --  16.0*  PLT 175  --  191    Cardiac Enzymes Recent Labs  Lab 08/05/17 1615 08/05/17 2009 08/06/17 0221 08/06/17 0735  TROPONINI <0.03 <0.03 0.03* <0.03    Recent Labs  Lab 08/05/17 1621  TROPIPOC 0.01     BNP Recent Labs  Lab 08/05/17 1615  BNP 1,988.2*     DDimer No results for input(s): DDIMER in the last 168 hours.   Radiology    Dg Chest 2 View  Result Date: 08/05/2017 CLINICAL DATA:  Chest pain. EXAM: CHEST - 2 VIEW COMPARISON:  Chest x-ray dated Jun 19, 2017. FINDINGS: The lateral view is limited due to overlapping arms. Unchanged left chest wall pacer device. Stable cardiomegaly status post CABG. Normal pulmonary vascularity. Unchanged scarring/atelectasis in the left lower lobe. No focal consolidation, pleural effusion, or pneumothorax. No acute osseous abnormality. IMPRESSION: No active cardiopulmonary disease. Electronically Signed   By: Titus Dubin M.D.   On: 08/05/2017 17:12   Ct Head Wo Contrast  Result Date: 08/05/2017 CLINICAL DATA:  Increased mental status and slurred speech with chest pain beginning this afternoon. EXAM: CT HEAD WITHOUT CONTRAST TECHNIQUE: Contiguous axial images were obtained from the base of the skull through the vertex without intravenous contrast. COMPARISON:  06/19/2017 and  05/16/2017 FINDINGS: Brain: Examination demonstrates mild stable prominence of the lateral and third ventricles. Cisterns are within normal. There is minimal chronic ischemic microvascular disease. 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 No evidence of intra-axial mass, mass effect, shift of midline structures or acute hemorrhage. No evidence of acute infarction. 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 Vascular: No hyperdense vessel or unexpected calcification. Skull: Normal. Negative for fracture or focal lesion. Sinuses/Orbits: No acute finding. Other: None. IMPRESSION: No acute findings. Minimal chronic ischemic microvascular disease. 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 Electronically Signed   By: Marin Olp M.D.   On: 08/05/2017 16:44    Cardiac Studies   TTE EF 20-25% moderate MR Severe LAE   Patient Profile     72 y.o. male distant CABG 1996 ischemic DCM EF 20-25% severe dementia stroke in  April with residual acoustic schwannoma Previous AICD Admitted with CHF exacerbation And atypical chest pain    Assessment & Plan    Chest Pain: atypical with no acute ECG changes and r/o despite 48 hours of sharp resting pain. Has old bypass grafts but given patients overall shape atypical nature of current pain would adjust meds and not recommend cath. Ranolazine added 08/06/17  CHF:  Currently not volume overloaded on exam despite BNP Cr ok and BP not too low Reasonable to start ACE held to start entresto in am   AICD:  No d/c NSVT on beta blocker  HLD:  Changed to crestor from high dose lipitor     For questions or updates, please contact [facility] Please consult [email or web address] for contact info under Cardiology/STEMI.      Signed, Jenkins Rouge, MD  08/07/2017, 8:14 AM

## 2017-08-07 NOTE — Care Management Note (Signed)
Case Management Note  Patient Details  Name: Ronnie Nelson MRN: [id number] Date of Birth: 02/14/1945  Subjective/Objective:    CHF               Action/Plan: Patient lives at home with family members; Primary Care Provider: Coy Saunas, MD Has private insurance with Medicare; he is active with [facility] is [place]. CM will continue to follow for progression of care. Attending MD please enter the face to face for [facility] services.  Expected Discharge Date:     Possibly 08/11/2017             Expected Discharge Plan:  [place]  Discharge planning Services  CM Consult  HH Arranged:  RN, PT, Nurse's Aide [facility] Agency:  [place]  Status of Service:  In process, will continue to follow  Sherrilyn Rist [phone] 08/07/2017, 12:31 PM

## 2017-08-07 NOTE — Evaluation (Signed)
Occupational Therapy Evaluation Patient Details Name: Ronnie Nelson MRN: [id number] DOB: February 25, 1945 Today's Date: 08/07/2017    History of Present Illness Pt is a 72 y.o. male presenting with chest pain and found to have CHF exacerbation. PMHx: CAD s/p CABG, HTN, Ischemic cardiomyopathy, Dementia, Depression, DM2, CVA.    Clinical Impression   Unsure of PLOF, pt unable to provide and no family present. Currently pt requires max assist for ADL. Attempted sit to stand from chair x2 with max assist +1; pt unable to achieve full upright standing due to bil LE weakness, knee buckling, and poor sequencing of task. Recommending SNF for follow up to maximize independence and safety with ADL and functional mobility. Pt would benefit from continued skilled OT to address established goals.    Follow Up Recommendations  SNF;Supervision/Assistance - 24 hour    Equipment Recommendations  Other (comment)(TBD at next venue)    Recommendations for Other Services PT consult     Precautions / Restrictions Precautions Precautions: Fall Restrictions Weight Bearing Restrictions: No      Mobility Bed Mobility               General bed mobility comments: Pt OOB in chair upon arrival  Transfers Overall transfer level: Needs assistance Equipment used: Rolling walker (2 wheeled) Transfers: Sit to/from Stand           General transfer comment: Attempted sit to stand x2 with max assist +1. Pt unable to acheive upright posture despite max cues. Pt with bil LE weakness, knee buckling and difficulty sequencing transfer.    Balance Overall balance assessment: Needs assistance Sitting-balance support: Feet supported;Bilateral upper extremity supported Sitting balance-Leahy Scale: Poor   Postural control: Posterior lean Standing balance support: Bilateral upper extremity supported Standing balance-Leahy Scale: Zero                             ADL either performed or assessed with  clinical judgement   ADL Overall ADL's : Needs assistance/impaired Eating/Feeding: Minimal assistance;Sitting Eating/Feeding Details (indicate cue type and reason): Min assist to support cup to mouth when taking meds Grooming: Minimal assistance;Sitting Grooming Details (indicate cue type and reason): Cues for initiation and sequencing Upper Body Bathing: Maximal assistance;Sitting   Lower Body Bathing: Maximal assistance;+2 for physical assistance;Sit to/from stand   Upper Body Dressing : Maximal assistance;Sitting   Lower Body Dressing: Maximal assistance;+2 for physical assistance;Sit to/from stand                 General ADL Comments: Attempted sit to stand x2 from chair with max assist. Pt unable to acheive full upright standing due to bil knee buckling and poor sequencing of task despite cues.     Vision   Additional Comments: Difficult to assess due to impaired cognition. Pt able to point to red call button with increased time.     Perception     Praxis      Pertinent Vitals/Pain Pain Assessment: No/denies pain     Hand Dominance     Extremity/Trunk Assessment Upper Extremity Assessment Upper Extremity Assessment: Generalized weakness;Difficult to assess due to impaired cognition   Lower Extremity Assessment Lower Extremity Assessment: Defer to PT evaluation       Communication Communication Communication: Other (comment)(difficult to understand at times)   Cognition Arousal/Alertness: Awake/alert Behavior During Therapy: Flat affect Overall Cognitive Status: No family/caregiver present to determine baseline cognitive functioning Area of Impairment: Orientation;Memory;Following commands;Safety/judgement;Problem solving  Orientation Level: Disoriented to;Place;Time;Situation   Memory: Decreased short-term memory Following Commands: Follows one step commands inconsistently;Follows one step commands with increased  time Safety/Judgement: Decreased awareness of safety;Decreased awareness of deficits   Problem Solving: Slow processing;Decreased initiation;Difficulty sequencing;Requires verbal cues;Requires tactile cues General Comments: Pt with known hx of dementia, unsure of baseline.   General Comments       Exercises     Shoulder Instructions      Home Living Family/patient expects to be discharged to:: Unsure                                 Additional Comments: Pt unable to provide home set up or PLOF information. No family present.      Prior Functioning/Environment          Comments: Pt unable to provide home set up or PLOF information. No family present.        OT Problem List: Decreased strength;Decreased activity tolerance;Impaired balance (sitting and/or standing);Decreased cognition;Decreased safety awareness;Decreased knowledge of use of DME or AE      OT Treatment/Interventions: Self-care/ADL training;Therapeutic exercise;Energy conservation;DME and/or AE instruction;Therapeutic activities;Cognitive remediation/compensation;Balance training;Patient/family education    OT Goals(Current goals can be found in the care plan section) Acute Rehab OT Goals Patient Stated Goal: none stated OT Goal Formulation: With patient Time For Goal Achievement: 08/21/17 Potential to Achieve Goals: Fair ADL Goals Pt Will Perform Grooming: with supervision;sitting Pt Will Perform Upper Body Bathing: with min assist;sitting Pt Will Perform Lower Body Bathing: with mod assist;sit to/from stand Pt Will Transfer to Toilet: with min assist;stand pivot transfer;bedside commode Pt Will Perform Toileting - Clothing Manipulation and hygiene: with mod assist;sit to/from stand  OT Frequency: Min 2X/week   Barriers to D/C:            Co-evaluation              AM-PAC PT "6 Clicks" Daily Activity     Outcome Measure Help from another person eating meals?: A Little Help from  another person taking care of personal grooming?: A Little Help from another person toileting, which includes using toliet, bedpan, or urinal?: Total Help from another person bathing (including washing, rinsing, drying)?: A Lot Help from another person to put on and taking off regular upper body clothing?: A Lot Help from another person to put on and taking off regular lower body clothing?: A Lot 6 Click Score: 13   End of Session Equipment Utilized During Treatment: Gait belt;Rolling walker Nurse Communication: Mobility status  Activity Tolerance: Patient tolerated treatment well Patient left: in chair;with call bell/phone within reach;with chair alarm set  OT Visit Diagnosis: Other abnormalities of gait and mobility (R26.89);Unsteadiness on feet (R26.81);Muscle weakness (generalized) (M62.81);Other symptoms and signs involving cognitive function                Time: 1113-1130 OT Time Calculation (min): 17 min Charges:  OT General Charges $OT Visit: 1 Visit OT Evaluation $OT Eval Moderate Complexity: 1 Mod G-Codes:     Avice Funchess A. Ulice Brilliant, M.S., OTR/L Acute Rehab Department: [phone]  Binnie Kand 08/07/2017, 12:15 PM

## 2017-08-07 NOTE — Progress Notes (Signed)
Family Medicine Teaching Service Daily Progress Note Intern Pager: [phone]  Patient name: Ronnie Nelson Medical record number: [id number] Date of birth: [birth year] Age: 72 y.o. Gender: male  Primary Care Provider: Coy Saunas, MD Consultants: Cardiology Code Status: Full  Pt Overview and Major Events to Date:  Ronnie Nelson is a 72 y.o. male with a past medical history significant for CAD status post CABG, hypertension, ischemic cardiomyopathy, dementia, depression, type 2 diabetes (unclear), stroke who presents today complaining of chest pain and found to have CHF exacerbation.  Assessment and Plan:   Chest pain, acute on chronic Patient has improved chest pain this am, no complaints. Patient described pain as a dull left-sided without any radiation. In the ED initial troponin was negative. EKG normal sinus rhythm with evidence of right bundle branch block.  Heart score 6. Atypical presentation for chest pain. Patient has known history of CAD with extensive disease burden and LAD, circumflex and right coronary. Patient also has known history of ischemic cardiomyopathy with an ICD in place.  No recent SVT reported by defibrillator per cardiology.  Patient has elevated BNP with evidence of volume overload on exam.  Chest pain could possibly be secondary to progression of CAD versus CHF exacerbation. --Consult Cardiology; Cont medical management, change from crestor to lipitor, cont carvedilol, start entresto after holding acei for 36 hours --Daily weights, strict I's and O's: 1167ml out since admission with net -834ml --Nitroglycerin if BP allow or as needed --BNP elevated at 1,988.2  UTI UA on admission showed small leukocyte esterase positive nitrite and moderate hemoglobin.  Patient appears to be treated by PCP with doxycycline which is an unusual antibiotic for urinary tract infection.  Antibiotic choice could be due to a concern for possible prostatitis as a source of urinary tract  infection. S/p CTX 1g IV in ED. --Start Keflex 250mg  q6 (6/27-) for total of 7 days; stop date 7/3 --Urine culture, grew >100k gram negative rods, species pending  HFrEF Patient with a history of heart failure with reduced ejection fraction.  Last echo on April 2018 showed an EF of 20-25% with grade 3 diastolic dysfunction.  Patient also had mild to moderate dilated LA.  BNP on admission elevated at 1980.2.  Exam with mild crackles at the bases as well as lower extremity edema +1 pitting.  Weight on discharge at last admission (06/24/2017) was 155 pounds.  Patient has a symmetry on admission despite clear volume overload.  No evidence of  pulmonary edema or vascular congestion on chest x-ray.  Patient has recently lost 20 pounds.  Weight back in December 2018 at cardiologist office was 182 pounds.  Unclear dry weight. -- Digoxin level low, outpatient follow up or defer to Cardiology for management --Cardiology consult today; see note on chest pain, start entresto --Restart furosemide 20 mg daily  --Continue coreg 25 mg twice daily --Continue digoxin 0.0625 daily --Strict I's/O's --Daily weights   Nonsustained ventricular tachycardia (NSVT) Patient is followed by Dr.Croitoru and currently has ICD. Patient has had a defibrillator since 2009.  Patient has had reported VT by device with no shock required. Last pacing back in 2011. Patient has frequent defibrillator check with cardiology. --Discontinue on telemetry  CAD s/p CABG Patient has lower right sided chest pain at rest unlikely to be ACS as troponins have been flat. Patient received aspirin 325 mg. Initial EKG showed T wave flattening in lateral leads which resolved with repeat EKG. initial troponin negative. --Continue aspirin 81 mg  Ischemic  cardiomyopathy Patient has ICD in place with known reduced ejection fraction.  Closely followed by cardiology.   History of subclinical hyperthyroidism Patient has history of low TSH with  normal T4 consistent with clinical hypothyroidism.  Given cardiac history tendency for tachycardia/arrhythmias monitoring is warranted. T4 normal --Outpatient follow up  Hypertension SBP since admission 104-126 and DBP 74-97.  Well-controlled on current regimen. --Discontinue ramipril 10 mg --Continue Coreg 25 mg twice daily  Right occipital stroke Patient has a recent history of occipital stroke for which she was hospitalized April 2018.  Developed some left-sided deficit in addition to confusion.  Patient was sent to rehab.  We will continue to monitor.  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 Patient has had issues with bilateral hearing loss followed by PCP.  Patient was seen by ENT in early May, with recommendation for cardiology clearance prior to discussing surgery versus CyberKnife therapy. --Defer to outpatient management  Prediabetes HgbA1c of 5.8 on 6/26. No glycemic control medications. --Sensitive SSI --CBG AC nightly  Hyperlipidemia --Changed simvastatin 80mg  to rosuvastatin 20mg  daily --Discontinue niacin 500mg  daily --Continue ezetimibe 10 mg daily  Dementia Continue home regimen --Amantadine 100 mg twice daily --Donepezil 500 mg daily nightly --Risperidone 0.25 mg twice daily   Depression Patient recently lost with back in September 2018.  Patient has been significantly affected by significant office loss.  In the past month patient has lost 20 pounds and was noted to be emotional multiple providers visit.  Recently started on SSRI. --Continue mirtazapine 7.5 mg nightly --Continue sertraline 100 mg daily  Sacral decubitus ulcer, stage II --Consult wound care  GERD --Continue omeprazole  20 mg bid  --Continue ezetimibe 10 mg qhs --Continue sulcrafate 1 g qid  FEN/GI: Heart Healthy PPx: Lovenox  Disposition: inpatient  Subjective:  Patient is still having lower right sided  chest pain that is now worse with palpation, intermittent. Patient is not a very reliable historian and changes his report on how painful his chest pain is frequently.  Objective: Temp:  [97 F (36.1 C)-98.6 F (37 C)] 98.6 F (37 C) (06/28 0404) Pulse Rate:  [66-84] 82 (06/28 0530) Resp:  [17-18] 18 (06/28 0404) BP: (102-142)/(82-109) 140/97 (06/28 0530) SpO2:  [94 %-100 %] 99 % (06/28 0404) Weight:  [151 lb 7.3 oz (68.7 kg)] 151 lb 7.3 oz (68.7 kg) (06/28 0216) Physical Exam: Gen: Alert and Oriented x 3, NAD HEENT: Normocephalic, atraumatic, PERRLA, EOMI CV: RRR, 3/6 murmurs, normal S1, S2 split with S4 gallop, +2 pulses dorsalis pedis bilaterally Resp: decreased breath sounds bilaterally, no wheezing, rales, or rhonchi, comfortable work of breathing Abd: non-distended, non-tender, soft, +bs in all four quadrants MSK: Moves all four extremities Ext: no clubbing, cyanosis, trace edema Skin: warm, dry, intact, no rashes  Laboratory: Recent Labs  Lab 08/05/17 1615 08/05/17 1622 08/07/17 0720  WBC 6.9  --  7.2  HGB 13.8 14.3 13.9  HCT 43.1 42.0 42.0  PLT 175  --  191   Recent Labs  Lab 08/05/17 1615 08/05/17 1622 08/06/17 0735 08/07/17 0720  NA 141 143 144 141  K 3.8 3.7 3.4* 3.5  CL 108 105 106 104  CO2 23  --  28 26  BUN 10 12 9 10   CREATININE 0.91 0.80 0.94 0.91  CALCIUM 9.0  --  8.8* 8.8*  PROT 6.4*  --   --   --   BILITOT 1.1  --   --   --  ALKPHOS 73  --   --   --   ALT 22  --   --   --   AST 23  --   --   --   GLUCOSE 105* 103* 118* 89   Troponin I: <0.03, 0.03, <0.03 TSH: 0.140 T4: 1.16 HgbA1c: 5.8 Mag: 1.9 Phosphorous: 3.3 U/A: Hgb mod, Pos Nitrites,small Leuks, many bacteria UCx: >100k gram negative rods BNP: 1988.2 Dig level: pending  Imaging/Diagnostic Tests:  CXR IMPRESSION: No active cardiopulmonary disease.  CT Head IMPRESSION: No acute findings.  Minimal chronic ischemic microvascular disease. Small old left occipital  infarct.  Stable known right CP angle mass.  Nuala Alpha, DO 08/07/2017, 7:01 AM PGY-1, [place]

## 2017-08-08 LAB — GLUCOSE, CAPILLARY
GLUCOSE-CAPILLARY: 84 mg/dL (ref 70–99)
GLUCOSE-CAPILLARY: 114 mg/dL — AB (ref 70–99)
Glucose-Capillary: 120 mg/dL — ABNORMAL HIGH (ref 70–99)
Glucose-Capillary: 95 mg/dL (ref 70–99)

## 2017-08-08 LAB — URINE CULTURE

## 2017-08-08 NOTE — Progress Notes (Signed)
Family Medicine Teaching Service Daily Progress Note Intern Pager: [phone]  Patient name: Ronnie Nelson Medical record number: [id number] Date of birth: 08/04/1945 Age: 72 y.o. Gender: male  Primary Care Provider: Coy Saunas, MD Consultants: Cardiology Code Status: Full  Pt Overview and Major Events to Date:  6/26 Admit for ACS workup, found to E. Coli UTI 6/27 TTE c/w worsened EF 25% with diffuse hypokenesis, G2DD, cards rec med management  Assessment and Plan: Ronnie Nelson is a 72 y.o. male with a past medical history significant for CAD status post CABG, hypertension, ischemic cardiomyopathy, dementia, depression, type 2 diabetes (unclear), stroke who presents today complaining of chest pain and found to have CHF exacerbation.  1.  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  Stable.  Has known diffuse CAD with ICD followed by Dr. Sallyanne Kuster.  TTE with worsened EF now 25% with diffuse hypokinesis and G1DD.  Cardiology recommending Ranexa, Entresto, high intensity statin, and beta-blocker for NSVT.  Not a candidate for cardiac cath. - Cardiology consulted, appreciate recommendations - Cardiac monitoring - Continue Entresto 24-26 mg twice daily, Ranexa 500 mg twice daily, digoxin 0.0625 mg daily, Coreg 25 mg twice daily - Continue aspirin 81 mg daily, Crestor 20 mg daily, Zetia 10 mg daily - Consider palliative consult while awaiting daughter's recommendations - Daily weights, strict I&O's - Supplemental O2, continuous pulse ox - PT/OT recommending SNF, need to discuss with daughter  2.  E. coli acute cystitis: Acute.  Awaiting septic ability.  Currently on Keflex.  Foley in place for acute on chronic heart failure. - Continue Keflex 250 mg 4 times daily, day 3 of 7 - Await urine culture sensitivity  3.  Subclinical hyperthyroidism: Chronic.  Free T4 normal. - Follow-up outpatient  4.  Primary hypertension: Chronic with known ICM.  BP is 140s/110s. -  Transitioning from ramipril to [facility] - See problem #1  5.  Right occipital stroke  acoustic schwannoma: Chronic.  Hospitalized 05/2016.  Developed residual left-sided deficit in addition to confusion.  Was followed at rehab.  Remains confused.  Awaiting outpatient cardiac clearance by ENT for CyberKnife therapy for schwannoma. - Continue to monitor  6.  Prediabetes: A1c 5.8.  No glycemic control at home. - Symptoms sliding scale and CBGs AC/nightly  7.  Depression  dementia: Chronic.  Per report patient has been significantly affected by the loss of his wife on 10/2016.  Patient has 20 pound weight loss with emotional difficulties by multiple providers.  Recently started on SSRI. - Continue mirtazapine 7.5 mg nightly and sertraline 100 mg daily  8.  Stage II sacral decubitus pressure injury: Uncertain as today.  Remains afebrile. - Wound care consulted  9.  GERD: Chronic.  Stable. - Omeprazole 20 mg twice daily, ezetimibe 10 mg nightly, sulcrafate 1 g 4 times daily  FEN/GI: Heart Healthy PPx: Lovenox  Disposition: Pending SNF placement and possible palliative consult based on family's recommendations.  Awaiting phone call by patient's daughter Ronnie Nelson.  Subjective:  Patient states he is no longer having chest pain.  He is without shortness of breath.  Objective: Temp:  [97.8 F (36.6 C)-98.7 F (37.1 C)] 97.8 F (36.6 C) (06/29 0607) Pulse Rate:  [71-85] 84 (06/29 0900) Resp:  [17-18] 18 (06/29 0607) BP: (112-160)/(90-117) 146/110 (06/29 0900) SpO2:  [93 %-100 %] 95 % (06/29 6578) Physical Exam: General: well nourished, well developed, NAD with non-toxic appearance HEENT: normocephalic, atraumatic, moist mucous  membranes Neck: supple, non-tender without lymphadenopathy, no JVD Cardiovascular: regular rate and rhythm without murmurs, rubs, or gallops Lungs: clear to auscultation bilaterally with normal work of breathing Abdomen: soft, non-tender, non-distended,  normoactive bowel sounds GU: urine Foley intact with amber urine Skin: warm, dry, no rashes or lesions, cap refill < 2 seconds Extremities: warm and well perfused, normal tone, no edema Neuro: A&O to self only, grossly moving all 4 extremities, no dysarthria Psych: euthymic mood, flat affect  Laboratory: Recent Labs  Lab 08/05/17 1615 08/05/17 1622 08/07/17 0720  WBC 6.9  --  7.2  HGB 13.8 14.3 13.9  HCT 43.1 42.0 42.0  PLT 175  --  191   Recent Labs  Lab 08/05/17 1615 08/05/17 1622 08/06/17 0735 08/07/17 0720  NA 141 143 144 141  K 3.8 3.7 3.4* 3.5  CL 108 105 106 104  CO2 23  --  28 26  BUN 10 12 9 10   CREATININE 0.91 0.80 0.94 0.91  CALCIUM 9.0  --  8.8* 8.8*  PROT 6.4*  --   --   --   BILITOT 1.1  --   --   --   ALKPHOS 73  --   --   --   ALT 22  --   --   --   AST 23  --   --   --   GLUCOSE 105* 103* 118* 89   6/28 digoxin level <0.2 6/27 troponin I <0.03 6/27 free T4 WNL 6/26 urine culture >100,000 colonies of E. coli 6/26 A1c 5.8 6/26 TSH low, 0.140 6/26 phosphorus WNL 6/26 magnesium WNL 6/26 HIV antibody nonreactive 6/26 UA AC, moderate hemoglobin, many bacteria, positive nitrite, small leukocyte, 11-20 WBC 6/26 BNP 1988.2 6/26 INR 1.17  Imaging/Diagnostic Tests: ECHO COMPLETE WO IMAGING ENHANCING AGENT (08/06/2017) - Left ventricle: The cavity size was moderately dilated. Wall   thickness was increased in a pattern of mild LVH. Systolic   function was severely reduced. The estimated ejection fraction   was in the range of 20% to 25%. Diffuse hypokinesis. There is   akinesis of the inferior myocardium. Features are consistent with   a pseudonormal left ventricular filling pattern, with concomitant   abnormal relaxation and increased filling pressure (grade 2   diastolic dysfunction). - Aortic valve: Trileaflet; mildly thickened, mildly calcified   leaflets. - Mitral valve: There was moderate regurgitation. - Left atrium: The atrium was severely  dilated. Volume/bsa, ES,   (1-plane Simpson&'s, A2C): 53.1 ml/m^2. - Right ventricle: Pacer wire or catheter noted in right ventricle. - Pulmonic valve: There was moderate regurgitation. - Pulmonary arteries: Systolic pressure was mildly increased. PA   peak pressure: 36 mm Hg (S).  CHEST - 2 VIEW (08/05/2017) No active cardiopulmonary disease.  CT HEAD WITHOUT CONTRAST (08/05/2017) No acute findings. Minimal chronic ischemic microvascular disease. 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    Shannon Bing, DO 08/08/2017, 10:17 AM PGY-2, [place] Intern pager: [phone], text pages welcome

## 2017-08-08 NOTE — Progress Notes (Signed)
Progress Note  Patient Name: KO BARDON Date of Encounter: 08/08/2017  Primary Cardiologist: Dr Sallyanne Kuster  Subjective   Confused; denies CP or dyspnea  Inpatient Medications    Scheduled Meds: . amantadine  100 mg Oral BID  . aspirin EC  81 mg Oral Daily  . carvedilol  25 mg Oral BID WC  . cephALEXin  250 mg Oral Q6H  . digoxin  0.0625 mg Oral Daily  . donepezil  5 mg Oral QHS  . enoxaparin (LOVENOX) injection  40 mg Subcutaneous Q24H  . ezetimibe  10 mg Oral QHS  . furosemide  20 mg Oral Daily  . insulin aspart  0-9 Units Subcutaneous TID WC  . liver oil-zinc oxide   Topical BID  . mirtazapine  7.5 mg Oral QHS  . pantoprazole  40 mg Oral BID  . ranolazine  500 mg Oral BID  . rosuvastatin  20 mg Oral q1800  . sacubitril-valsartan  1 tablet Oral BID  . sertraline  100 mg Oral Daily   Continuous Infusions:  PRN Meds: acetaminophen **OR** acetaminophen   Vital Signs    Vitals:   08/08/17 0607 08/08/17 0900 08/08/17 1045 08/08/17 1100  BP: (!) 140/106 (!) 146/110  140/90  Pulse: 81 84 71 70  Resp: 18   18  Temp: 97.8 F (36.6 C)   97.8 F (36.6 C)  TempSrc: Oral   Oral  SpO2: 95%   96%  Weight:      Height:        Intake/Output Summary (Last 24 hours) at 08/08/2017 1320 Last data filed at 08/08/2017 1108 Gross per 24 hour  Intake 170 ml  Output 1100 ml  Net -930 ml   Filed Weights   08/05/17 2045 08/06/17 0508 08/07/17 0216  Weight: 155 lb 10.3 oz (70.6 kg) 151 lb 3.2 oz (68.6 kg) 151 lb 7.3 oz (68.7 kg)    Telemetry    Sinus with pvcs and NSVT- Personally Reviewed   Physical Exam   GEN: No acute distress.   Neck: No JVD Cardiac: RRR, no murmurs, rubs, or gallops.  Respiratory: Clear to auscultation bilaterally. GI: Soft, nontender, non-distended  MS: No edema Neuro:  Moves ext; confused.   Labs    Chemistry Recent Labs  Lab 08/05/17 1615 08/05/17 1622 08/06/17 0735 08/07/17 0720  NA 141 143 144 141  K 3.8 3.7 3.4* 3.5  CL 108  105 106 104  CO2 23  --  28 26  GLUCOSE 105* 103* 118* 89  BUN 10 12 9 10   CREATININE 0.91 0.80 0.94 0.91  CALCIUM 9.0  --  8.8* 8.8*  PROT 6.4*  --   --   --   ALBUMIN 3.5  --   --   --   AST 23  --   --   --   ALT 22  --   --   --   ALKPHOS 73  --   --   --   BILITOT 1.1  --   --   --   GFRNONAA >60  --  >60 >60  GFRAA >60  --  >60 >60  ANIONGAP 10  --  10 11     Hematology Recent Labs  Lab 08/05/17 1615 08/05/17 1622 08/07/17 0720  WBC 6.9  --  7.2  RBC 4.62  --  4.66  HGB 13.8 14.3 13.9  HCT 43.1 42.0 42.0  MCV 93.3  --  90.1  MCH 29.9  --  29.8  MCHC 32.0  --  33.1  RDW 16.2*  --  16.0*  PLT 175  --  191    Cardiac Enzymes Recent Labs  Lab 08/05/17 1615 08/05/17 2009 08/06/17 0221 08/06/17 0735  TROPONINI <0.03 <0.03 0.03* <0.03    Recent Labs  Lab 08/05/17 1621  TROPIPOC 0.01     BNP Recent Labs  Lab 08/05/17 1615  BNP 1,988.2*      Patient Profile     72 y.o. male withh h/o CABG 1996, ischemic DCM EF 20-25%, dementia, CVA with residual acoustic schwannoma, previous AICD admitted with CHF exacerbation and atypical chest pain.    Assessment & Plan    1 chest pain-patient has ruled out.  He is pain-free.  Given dementia he is not a candidate for aggressive cardiac evaluation.  2 acute on chronic systolic congestive heart failure-patient appears to be euvolemic on examination.  Continue present dose of Lasix.  3 ischemic cardiomyopathy-continue Entresto and beta-blocker.  4 Coronary artery disease-continue aspirin and statin.  5 prior ICD  6 significant dementia  Cardiology will sign off.  Please call with questions.  For questions or updates, please contact [place] Please consult [email or web address] for contact info under Cardiology/STEMI.      Signed, Kirk Ruths, MD  08/08/2017, 1:20 PM

## 2017-08-08 NOTE — Plan of Care (Signed)
  Problem: Safety: Goal: Ability to remain free from injury will improve Outcome: Progressing Pt. Requires assistance with transfers   Problem: Activity: Goal: Risk for activity intolerance will decrease Outcome: Not Progressing

## 2017-08-08 NOTE — Progress Notes (Signed)
Patients family arrives at bedside insisting pt was hungry, contrary to his personal denial of  Hunger.  Pt was given a spoonful of applesauce with his antibiotic, to which he did not express appreciation. Pt denied desire for more applesauce, family continued to feed him the remaining sauce in the cup.  Pt tells family he is not hungry, family leaves bedside.

## 2017-08-08 NOTE — Progress Notes (Signed)
Patient awake during report, confused at baseline. Pt denies needs, safely in bed with bed alarm on.

## 2017-08-08 NOTE — Progress Notes (Signed)
Dr Erin Hearing at bedside; performs neuro assessment when notified of possible concern for neurological changes.   No changes/concerns noted by Dr Erin Hearing. Pt proceeded to take his pills.

## 2017-08-09 LAB — CBC
HCT: 46.4 % (ref 39.0–52.0)
Hemoglobin: 15.4 g/dL (ref 13.0–17.0)
MCH: 29.8 pg (ref 26.0–34.0)
MCHC: 33.2 g/dL (ref 30.0–36.0)
MCV: 89.7 fL (ref 78.0–100.0)
PLATELETS: 173 10*3/uL (ref 150–400)
RBC: 5.17 MIL/uL (ref 4.22–5.81)
RDW: 16.1 % — AB (ref 11.5–15.5)
WBC: 7.6 10*3/uL (ref 4.0–10.5)

## 2017-08-09 LAB — COMPREHENSIVE METABOLIC PANEL
ALT: 15 U/L (ref 0–44)
AST: 19 U/L (ref 15–41)
Albumin: 3 g/dL — ABNORMAL LOW (ref 3.5–5.0)
Alkaline Phosphatase: 70 U/L (ref 38–126)
Anion gap: 11 (ref 5–15)
BUN: 7 mg/dL — AB (ref 8–23)
CHLORIDE: 104 mmol/L (ref 98–111)
CO2: 25 mmol/L (ref 22–32)
Calcium: 8.6 mg/dL — ABNORMAL LOW (ref 8.9–10.3)
Creatinine, Ser: 0.9 mg/dL (ref 0.61–1.24)
Glucose, Bld: 76 mg/dL (ref 70–99)
POTASSIUM: 3.4 mmol/L — AB (ref 3.5–5.1)
Sodium: 140 mmol/L (ref 135–145)
TOTAL PROTEIN: 5.7 g/dL — AB (ref 6.5–8.1)
Total Bilirubin: 1.5 mg/dL — ABNORMAL HIGH (ref 0.3–1.2)

## 2017-08-09 LAB — GLUCOSE, CAPILLARY
GLUCOSE-CAPILLARY: 120 mg/dL — AB (ref 70–99)
GLUCOSE-CAPILLARY: 82 mg/dL (ref 70–99)
GLUCOSE-CAPILLARY: 84 mg/dL (ref 70–99)
GLUCOSE-CAPILLARY: 85 mg/dL (ref 70–99)

## 2017-08-09 NOTE — Progress Notes (Signed)
Order entered for cardiac monitoring per MD note from today that states "cardiac monitoring" as part of his cardiac careplan.  Without this order CCMD would not continue to monitor pt.

## 2017-08-09 NOTE — Plan of Care (Signed)
  Problem: Activity: Goal: Risk for activity intolerance will decrease Outcome: Progressing   Problem: Safety: Goal: Ability to remain free from injury will improve Outcome: Progressing   

## 2017-08-09 NOTE — Progress Notes (Signed)
Patient resting comfortably during shift report. Denies complaints.  

## 2017-08-09 NOTE — Progress Notes (Signed)
Sister-in-Law at bedside.  Pt's condom cath has come off. Will replace.

## 2017-08-09 NOTE — Progress Notes (Signed)
Family Medicine Teaching Service Daily Progress Note Intern Pager: [phone]  Patient name: Ronnie Nelson Medical record number: [id number] Date of birth: April 08, 1945 Age: 72 y.o. Gender: male  Primary Care Provider: Coy Saunas, MD Consultants: Cardiology Code Status: Full  Pt Overview and Major Events to Date:  6/26 Admit for ACS workup, found to E. Coli UTI 6/27 TTE c/w worsened EF 25% with diffuse hypokenesis, G2DD, cards rec med management  Assessment and Plan: Ronnie Nelson is a 72 y.o. male with a past medical history significant for CAD status post CABG, hypertension, ischemic cardiomyopathy, dementia, depression, type 2 diabetes (unclear), stroke who presents today complaining of chest pain and found to have CHF exacerbation.  1.  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  Stable.  Has known diffuse CAD with ICD followed by Dr. Sallyanne Kuster.  TTE with worsened EF now 25% with diffuse hypokinesis and G1DD.  Cardiology recommending Ranexa, Entresto, high intensity statin, and beta-blocker for NSVT.  Not a candidate for cardiac cath. - Cardiology consulted, appreciate recommendations - Cardiac monitoring - Continue Entresto 24-26 mg twice daily, Ranexa 500 mg twice daily, digoxin 0.0625 mg daily, Coreg 25 mg twice daily - Continue aspirin 81 mg daily, Crestor 20 mg daily, Zetia 10 mg daily - Consider palliative consult while awaiting daughter's recommendations - Daily weights, strict I&O's - Supplemental O2, continuous pulse ox - PT/OT recommending SNF, need to discuss with daughter  2.  E. coli acute cystitis: Acute.  Awaiting septic ability.  Currently on Keflex.  Foley in place for acute on chronic heart failure. - Continue Keflex 250 mg 4 times daily, day 4 of 7 - Await urine culture sensitivity  3.  Subclinical hyperthyroidism: Chronic.  Free T4 normal. - Follow-up outpatient  4.  Primary hypertension: Chronic with known ICM.  BP is 140s/110s. -  Transitioning from ramipril to [facility] - See problem #1  5.  Right occipital stroke  acoustic schwannoma: Chronic.  Hospitalized 05/2016.  Developed residual left-sided deficit in addition to confusion.  Was followed at rehab.  Remains confused.  Awaiting outpatient cardiac clearance by ENT for CyberKnife therapy for schwannoma. - Continue to monitor  6.  Prediabetes: A1c 5.8.  No glycemic control at home. - Symptoms sliding scale and CBGs AC/nightly  7.  Depression  dementia: Chronic.  Per report patient has been significantly affected by the loss of his wife on 10/2016.  Patient has 20 pound weight loss with emotional difficulties by multiple providers.  Recently started on SSRI. - Continue mirtazapine 7.5 mg nightly and sertraline 100 mg daily  8.  Stage II sacral decubitus pressure injury: Uncertain as today.  Remains afebrile. - Wound care consulted  9.  GERD: Chronic.  Stable. - Omeprazole 20 mg twice daily, ezetimibe 10 mg nightly, sulcrafate 1 g 4 times daily  FEN/GI: Heart Healthy PPx: Lovenox  Disposition: Pending SNF placement and possible palliative consult based on family's recommendations.  Awaiting phone call by patient's daughter [name].  Subjective:  Patient without complaints. Chest pain resolved.  Objective: Temp:  [97.4 F (36.3 C)-98.2 F (36.8 C)] 97.4 F (36.3 C) (06/30 0424) Pulse Rate:  [67-84] 70 (06/30 0424) Resp:  [18-20] 20 (06/30 0424) BP: (100-146)/(77-110) 128/91 (06/30 0424) SpO2:  [93 %-100 %] 93 % (06/30 0424) Physical Exam: General: well nourished, well developed, NAD with non-toxic appearance HEENT: normocephalic, atraumatic, moist mucous membranes Neck: supple, non-tender without lymphadenopathy, no JVD Cardiovascular: regular  rate and rhythm without murmurs, rubs, or gallops Lungs: clear to auscultation bilaterally with normal work of breathing Abdomen: soft, non-tender, non-distended, normoactive bowel sounds Skin: warm,  dry, no rashes or lesions, cap refill < 2 seconds Extremities: warm and well perfused, normal tone, no edema Psych: euthymic mood, congruent affect Neuro: A&O only to self, moves all 4 extremities  Laboratory: Recent Labs  Lab 08/05/17 1615 08/05/17 1622 08/07/17 0720  WBC 6.9  --  7.2  HGB 13.8 14.3 13.9  HCT 43.1 42.0 42.0  PLT 175  --  191   Recent Labs  Lab 08/05/17 1615 08/05/17 1622 08/06/17 0735 08/07/17 0720  NA 141 143 144 141  K 3.8 3.7 3.4* 3.5  CL 108 105 106 104  CO2 23  --  28 26  BUN 10 12 9 10   CREATININE 0.91 0.80 0.94 0.91  CALCIUM 9.0  --  8.8* 8.8*  PROT 6.4*  --   --   --   BILITOT 1.1  --   --   --   ALKPHOS 73  --   --   --   ALT 22  --   --   --   AST 23  --   --   --   GLUCOSE 105* 103* 118* 89   6/28 digoxin level <0.2 6/27 troponin I <0.03 6/27 free T4 WNL 6/26 urine culture >100,000 colonies of E. coli 6/26 A1c 5.8 6/26 TSH low, 0.140 6/26 phosphorus WNL 6/26 magnesium WNL 6/26 HIV antibody nonreactive 6/26 UA AC, moderate hemoglobin, many bacteria, positive nitrite, small leukocyte, 11-20 WBC 6/26 BNP 1988.2 6/26 INR 1.17  Imaging/Diagnostic Tests: ECHO COMPLETE WO IMAGING ENHANCING AGENT (08/06/2017) - Left ventricle: The cavity size was moderately dilated. Wall   thickness was increased in a pattern of mild LVH. Systolic   function was severely reduced. The estimated ejection fraction   was in the range of 20% to 25%. Diffuse hypokinesis. There is   akinesis of the inferior myocardium. Features are consistent with   a pseudonormal left ventricular filling pattern, with concomitant   abnormal relaxation and increased filling pressure (grade 2   diastolic dysfunction). - Aortic valve: Trileaflet; mildly thickened, mildly calcified   leaflets. - Mitral valve: There was moderate regurgitation. - Left atrium: The atrium was severely dilated. Volume/bsa, ES,   (1-plane Simpson&'s, A2C): 53.1 ml/m^2. - Right ventricle: Pacer wire  or catheter noted in right ventricle. - Pulmonic valve: There was moderate regurgitation. - Pulmonary arteries: Systolic pressure was mildly increased. PA   peak pressure: 36 mm Hg (S).  CHEST - 2 VIEW (08/05/2017) No active cardiopulmonary disease.  CT HEAD WITHOUT CONTRAST (08/05/2017) No acute findings. Minimal chronic ischemic microvascular disease. 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    [place] Bing, DO 08/09/2017, 7:19 AM PGY-2, [place] Intern pager: [phone], text pages welcome

## 2017-08-10 DIAGNOSIS — Z95810 Presence of automatic (implantable) cardiac defibrillator: Secondary | ICD-10-CM

## 2017-08-10 DIAGNOSIS — I447 Left bundle-branch block, unspecified: Secondary | ICD-10-CM

## 2017-08-10 DIAGNOSIS — I25810 Atherosclerosis of coronary artery bypass graft(s) without angina pectoris: Secondary | ICD-10-CM

## 2017-08-10 DIAGNOSIS — I5042 Chronic combined systolic (congestive) and diastolic (congestive) heart failure: Secondary | ICD-10-CM

## 2017-08-10 DIAGNOSIS — I472 Ventricular tachycardia: Secondary | ICD-10-CM

## 2017-08-10 DIAGNOSIS — Z515 Encounter for palliative care: Secondary | ICD-10-CM

## 2017-08-10 DIAGNOSIS — Z7189 Other specified counseling: Secondary | ICD-10-CM

## 2017-08-10 LAB — GLUCOSE, CAPILLARY
GLUCOSE-CAPILLARY: 146 mg/dL — AB (ref 70–99)
GLUCOSE-CAPILLARY: 100 mg/dL — AB (ref 70–99)
Glucose-Capillary: 87 mg/dL (ref 70–99)
Glucose-Capillary: 165 mg/dL — ABNORMAL HIGH (ref 70–99)

## 2017-08-10 MED ORDER — SODIUM CHLORIDE 0.9 % IV BOLUS
250.0000 mL | Freq: Once | INTRAVENOUS | Status: AC
  Administered 2017-08-10: 250 mL via INTRAVENOUS

## 2017-08-10 NOTE — Clinical Social Work Note (Signed)
CSW acknowledges SNF consult. Cardiolgy MD's note states that quality of life is steadily worsening. Palliative was consulted yesterday for goals of care. CSW will follow for their recommendations.  [place] Scrape, [place]

## 2017-08-10 NOTE — Consult Note (Signed)
Consultation Note Date: 08/10/2017   Patient Name: Ronnie Nelson  DOB: December 23, 1945  MRN: [id number]  Age / Sex: 72 y.o., male  PCP: Coy Saunas, MD Referring Physician: Lind Covert, MD  Reason for Consultation: Establishing goals of care  HPI/Patient Profile: 72 y.o. male  with past medical history of CAD s/p CABG in 1996, HTN, ischemic cardiomyopathy, dementia, depression, T2DM, stroke, and acoustic neuroma admitted on 08/05/2017 with atypical chest pain and found to have CHF exacerbation. Echo revealed worsened EF now 25%. Has known diffuse CAD with ICD followed by Dr. Sallyanne Kuster. Not a cardiac cath candidate. Also found to have E. Coli UTI. Per report, patient has been significantly affected by the loss of his wife on is September 2018. Patient has had 20 pound weight loss since then. Has stage 2 sacral decub. PMT consult for [place].  Clinical Assessment and Goals of Care: I have reviewed medical records including EPIC notes, labs and imaging, received report from nursing staff, assessed the patient and called daughter  to discuss diagnosis prognosis, GOC, EOL wishes, disposition and options.  Daughter not available to be here today d/t childcare issues. Does not want to have [place] discussion over phone. She tells me she will be here tomorrow morning for GOC. She is the only child. Patient is widowed. Patient's sister is also involved in his care.   I introduced Palliative Medicine as specialized medical care for people living with serious illness. It focuses on providing relief from the symptoms and stress of a serious illness. The goal is to improve quality of life for both the patient and the family.  We briefly discussed that patient has been declining at home - requiring 24 hour care. She understands he is very sick and we discussed his heart failure.   Questions and concerns were addressed.  The family was encouraged to call with questions  or concerns.   Primary Decision Maker NEXT OF KIN - daughter, [name]   SUMMARY OF RECOMMENDATIONS   -Brief discussion with daughter over phone today to introduce palliative care, answer questions regarding current status, and schedule GOC meeting for tomorrow (7/2) morning  Code Status/Advance Care Planning:  Full code - will address in [place] meeting tomorrow  Symptom Management:   Per primary - comfortable upon my assessment  Psycho-social/Spiritual:   Desire for further Chaplaincy support:no  Additional Recommendations: Education on Hospice  Prognosis:   Unable to determine - poor prognosis d/t heart failure and CAD   Discharge Planning: To Be Determined      Primary Diagnoses: Present on Admission: **None**   I have reviewed the medical record, interviewed the patient and family, and examined the patient. The following aspects are pertinent.  Past Medical History:  Diagnosis Date  . CAD (coronary artery disease)    a. CABG 1996: SVG to LAD,LIMA to CX,SVG to RCA  . DM (diabetes mellitus) ([place])   . HTN (hypertension)   . Hyperlipidemia   . ICD (implantable cardioverter-defibrillator) in place    a. Generator Medtronic Evera MRI [place] VR model [id number] serial number [id number]  . Ischemic cardiomyopathy    a.s/p ICD placement   Social History   Socioeconomic History  . Marital status: Married    Spouse name: Not on file  . Number of children: Not on file  . Years of education: Not on file  . Highest education level: Not on file  Occupational History  . Not on file  Social Needs  . Emergency planning/management officer  strain: Not on file  . Food insecurity:    Worry: Not on file    Inability: Not on file  . Transportation needs:    Medical: Not on file    Non-medical: Not on file  Tobacco Use  . Smoking status: Former Smoker    Last attempt to quit: 02/10/1995    Years since quitting: 22.5  . Smokeless tobacco: Never Used  Substance and Sexual Activity  . Alcohol  use: No  . Drug use: No  . Sexual activity: Not on file  Lifestyle  . Physical activity:    Days per week: Not on file    Minutes per session: Not on file  . Stress: Not on file  Relationships  . Social connections:    Talks on phone: Not on file    Gets together: Not on file    Attends religious service: Not on file    Active member of club or organization: Not on file    Attends meetings of clubs or organizations: Not on file    Relationship status: Not on file  Other Topics Concern  . Not on file  Social History Narrative  . Not on file   Family History  Family history unknown: Yes   Scheduled Meds: . amantadine  100 mg Oral BID  . aspirin EC  81 mg Oral Daily  . carvedilol  25 mg Oral BID WC  . cephALEXin  250 mg Oral Q6H  . digoxin  0.0625 mg Oral Daily  . donepezil  5 mg Oral QHS  . enoxaparin (LOVENOX) injection  40 mg Subcutaneous Q24H  . ezetimibe  10 mg Oral QHS  . furosemide  20 mg Oral Daily  . insulin aspart  0-9 Units Subcutaneous TID WC  . liver oil-zinc oxide   Topical BID  . mirtazapine  7.5 mg Oral QHS  . pantoprazole  40 mg Oral BID  . ranolazine  500 mg Oral BID  . rosuvastatin  20 mg Oral q1800  . sacubitril-valsartan  1 tablet Oral BID  . sertraline  100 mg Oral Daily   Continuous Infusions: . sodium chloride     PRN Meds:.acetaminophen **OR** acetaminophen No Known Allergies Review of Systems  Unable to perform ROS: Mental status change    Physical Exam  Constitutional: He appears lethargic. He appears cachectic. He has a sickly appearance. No distress.  HENT:  Head: Normocephalic and atraumatic.  Cardiovascular: Normal rate and regular rhythm.  Pulmonary/Chest: Effort normal.  Clear breath sounds anteriorly  Abdominal: Soft. Bowel sounds are normal.  Neurological: He appears lethargic. He is disoriented.  Skin: Skin is warm and dry.  Psychiatric: Cognition and memory are impaired.    Vital Signs: BP (!) 88/58 (BP Location: Left  Arm)   Pulse 66   Temp 98.4 F (36.9 C) (Oral)   Resp 16   Ht 5\' 7"  (1.702 m)   Wt 71.2 kg (157 lb)   SpO2 96%   BMI 24.59 kg/m  Pain Scale: 0-10   Pain Score: Asleep   SpO2: SpO2: 96 % O2 Device:SpO2: 96 % O2 Flow Rate: .   IO: Intake/output summary:   Intake/Output Summary (Last 24 hours) at 08/10/2017 1507 Last data filed at 08/10/2017 1300 Gross per 24 hour  Intake 480 ml  Output 650 ml  Net -170 ml    LBM: Last BM Date: 08/10/17 Baseline Weight: Weight: 70.3 kg (155 lb) Most recent weight: Weight: 71.2 kg (157 lb)  Palliative Assessment/Data: PPS 30%      Time Total: 60 minutes  Greater than 50%  of this time was spent counseling and coordinating care related to the above assessment and plan.  Juel Burrow, DNP, AGNP-C Palliative Medicine Team [phone] Pager: [phone]

## 2017-08-10 NOTE — Progress Notes (Signed)
Patient seen due to persistent hypotension, S/P 250 cc fluid bolus.  BP was 109/85, Pulse 72 at time of examination.  Patient awake and sitting up in bed eating dinner.  Denies symptoms of dizziness.    Gregery Na, DO  PGY-1, [place] Intern pager: [phone], text pages welcome

## 2017-08-10 NOTE — Progress Notes (Signed)
Progress Note  Patient Name: MAMIE DIIORIO Date of Encounter: 08/10/2017  Primary Cardiologist: No primary care provider on file.   Subjective   Very somnolent.  I was able to wake him up with some difficulty.  He states that he recognizes me, but does not remember my name.  When I asked him where we were he said he was in school.  He promptly fell asleep in about 30 more seconds.  Inpatient Medications    Scheduled Meds: . amantadine  100 mg Oral BID  . aspirin EC  81 mg Oral Daily  . carvedilol  25 mg Oral BID WC  . cephALEXin  250 mg Oral Q6H  . digoxin  0.0625 mg Oral Daily  . donepezil  5 mg Oral QHS  . enoxaparin (LOVENOX) injection  40 mg Subcutaneous Q24H  . ezetimibe  10 mg Oral QHS  . furosemide  20 mg Oral Daily  . insulin aspart  0-9 Units Subcutaneous TID WC  . liver oil-zinc oxide   Topical BID  . mirtazapine  7.5 mg Oral QHS  . pantoprazole  40 mg Oral BID  . ranolazine  500 mg Oral BID  . rosuvastatin  20 mg Oral q1800  . sacubitril-valsartan  1 tablet Oral BID  . sertraline  100 mg Oral Daily   Continuous Infusions:  PRN Meds: acetaminophen **OR** acetaminophen   Vital Signs    Vitals:   08/09/17 1119 08/09/17 1945 08/10/17 0431 08/10/17 0930  BP: 104/86 102/75 (!) 120/92 120/84  Pulse: 75 74 76 77  Resp: 20 16 18    Temp: 98 F (36.7 C) 98.4 F (36.9 C) 98.1 F (36.7 C)   TempSrc: Oral Oral Oral   SpO2: 96% 95% 96% 94%  Weight:   157 lb (71.2 kg)   Height:        Intake/Output Summary (Last 24 hours) at 08/10/2017 1145 Last data filed at 08/10/2017 0900 Gross per 24 hour  Intake 720 ml  Output 650 ml  Net 70 ml   Filed Weights   08/07/17 0216 08/07/17 1025 08/10/17 0431  Weight: 151 lb 7.3 oz (68.7 kg) 154 lb (69.9 kg) 157 lb (71.2 kg)    Telemetry    14 beat episode of nonsustained VT around 3 AM, otherwise mostly sinus rhythm with left bundle branch block- Personally Reviewed  ECG    No new tracing- Personally  Reviewed  Physical Exam  Somnolent, disoriented GEN: No acute distress.   Neck: No JVD Cardiac: RRR, paradoxically split second heart sound no murmurs, rubs, or gallops.  Well-healed left subclavian defibrillator site Respiratory: Clear to auscultation bilaterally. GI: Soft, nontender, non-distended  MS: No edema; No deformity. Neuro:  Nonfocal  Psych: Normal affect   Labs    Chemistry Recent Labs  Lab 08/05/17 1615  08/06/17 0735 08/07/17 0720 08/09/17 0650  NA 141   < > 144 141 140  K 3.8   < > 3.4* 3.5 3.4*  CL 108   < > 106 104 104  CO2 23  --  28 26 25   GLUCOSE 105*   < > 118* 89 76  BUN 10   < > 9 10 7*  CREATININE 0.91   < > 0.94 0.91 0.90  CALCIUM 9.0  --  8.8* 8.8* 8.6*  PROT 6.4*  --   --   --  5.7*  ALBUMIN 3.5  --   --   --  3.0*  AST 23  --   --   --  19  ALT 22  --   --   --  15  ALKPHOS 73  --   --   --  70  BILITOT 1.1  --   --   --  1.5*  GFRNONAA >60  --  >60 >60 >60  GFRAA >60  --  >60 >60 >60  ANIONGAP 10  --  10 11 11    < > = values in this interval not displayed.     Hematology Recent Labs  Lab 08/05/17 1615 08/05/17 1622 08/07/17 0720 08/09/17 0650  WBC 6.9  --  7.2 7.6  RBC 4.62  --  4.66 5.17  HGB 13.8 14.3 13.9 15.4  HCT 43.1 42.0 42.0 46.4  MCV 93.3  --  90.1 89.7  MCH 29.9  --  29.8 29.8  MCHC 32.0  --  33.1 33.2  RDW 16.2*  --  16.0* 16.1*  PLT 175  --  191 173    Cardiac Enzymes Recent Labs  Lab 08/05/17 1615 08/05/17 2009 08/06/17 0221 08/06/17 0735  TROPONINI <0.03 <0.03 0.03* <0.03    Recent Labs  Lab 08/05/17 1621  TROPIPOC 0.01     BNP Recent Labs  Lab 08/05/17 1615  BNP 1,988.2*     DDimer No results for input(s): DDIMER in the last 168 hours.   Radiology    No results found.  Cardiac Studies   Echo August 06, 2017 - Left ventricle: The cavity size was moderately dilated. Wall   thickness was increased in a pattern of mild LVH. Systolic   function was severely reduced. The estimated ejection  fraction   was in the range of 20% to 25%. Diffuse hypokinesis. There is   akinesis of the inferior myocardium. Features are consistent with   a pseudonormal left ventricular filling pattern, with concomitant   abnormal relaxation and increased filling pressure (grade 2   diastolic dysfunction). - Aortic valve: Trileaflet; mildly thickened, mildly calcified   leaflets. - Mitral valve: There was moderate regurgitation. - Left atrium: The atrium was severely dilated. Volume/bsa, ES,   (1-plane Simpson&'s, A2C): 53.1 ml/m^2. - Right ventricle: Pacer wire or catheter noted in right ventricle. - Pulmonic valve: There was moderate regurgitation. - Pulmonary arteries: Systolic pressure was mildly increased. PA   peak pressure: 36 mm Hg (S).   Patient Profile     72 y.o. 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  Assessment & Plan    1. CAD: Denies chest pain, although he is very sleepy.  He looks comfortable.  Suspicion for acute coronary syndrome is very low.  On aspirin and statin 2. CHF: Appears euvolemic on current medications.  He is on chronic therapy with Entresto and beta-blocker. 3. ICD: Normal function by recent remote download May 21. 4. NSVT: Has been seen frequently on previous defibrillator downloads.  He has not required defibrillator discharge.  Considering his severe clinical decline, need to readdress whether or active defibrillator therapies are appropriate for him. 5.  Altered mental status: Mr. Devan decline began when his wife passed away last 11/15/2022.  She had been making sure that he was receiving medications appropriately and he would come to his appointments.  It is possible that when she passed, his cognitive problems were unmasked.  Depression also probably played a significant part in his deterioration.  He drastically worsened following a stroke in  April.  Current hypersomnolence may  be related to medications and/or urinary infection.  His quality of life is clearly severely impacted and steadily worsening.  If the decision is made to proceed with palliative care only, we should turn off ventricular tachycardia/ventricular fibrillation detection and therapies.  For questions or updates, please contact [place] Please consult [email or web address] for contact info under Cardiology/STEMI.      Signed, Sanda Klein, MD  08/10/2017, 11:45 AM

## 2017-08-10 NOTE — Progress Notes (Addendum)
Family Medicine Teaching Service Daily Progress Note Intern Pager: [phone]  Patient name: Ronnie Nelson Medical record number: [id number] Date of birth: 29-Dec-1945 Age: 72 y.o. Gender: male  Primary Care Provider: Coy Saunas, MD Consultants: Cardiology Code Status: Full  Pt Overview and Major Events to Date:  6/26 Admit for ACS workup, found to E. Coli UTI 6/27 TTE c/w worsened EF 25% with diffuse hypokenesis, G2DD, cards rec med management  Assessment and Plan: Ronnie Nelson is a 72 y.o. male with a past medical history significant for CAD status post CABG, hypertension, ischemic cardiomyopathy, dementia, depression, type 2 diabetes (unclear), stroke who presented 6/26 complaining of chest pain and found to have CHF exacerbation.  1.  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  Stable.  Has known diffuse CAD with ICD followed by Dr. Sallyanne Kuster.  TTE with worsened EF now 25% with diffuse hypokinesis and G1DD.  Cardiology recommending Ranexa, Entresto, high intensity statin, and beta-blocker for NSVT.  Not a candidate for cardiac cath. - Cardiology consulted, appreciate recommendations - Cardiac monitoring - Continue Entresto 24-26 mg twice daily, Ranexa 500 mg twice daily, digoxin 0.0625 mg daily, Coreg 25 mg twice daily - Continue aspirin 81 mg daily, Crestor 20 mg daily, Zetia 10 mg daily - Palliative consulted, pending conversation with daughter - Daily weights, strict I&O's - Supplemental O2, continuous pulse ox - PT/OT recommending SNF, need to discuss with daughter  2.  E. coli acute cystitis: Acute. Currently on Keflex, appropriate per culture.  Foley in place for acute on chronic heart failure. - Continue Keflex 250 mg 4 times daily, day 5 of 7 - Await urine culture sensitivity  3.  Subclinical hyperthyroidism: Chronic.  Free T4 normal. - Follow-up outpatient  4.  Primary hypertension: Chronic with known ICM.  Actually hypotensive today with BP  86/59 at 1223, repeat BP 88/58.  Patient re-examined, found to be somnolent.  Given 250cc bolus NS.   - Holding Lasix and Entresto at present due to hypotension.  Will restart as needed. - See problem #1  5.  Right occipital stroke  acoustic schwannoma: Chronic.  Hospitalized 05/2016.  Developed residual left-sided deficit in addition to confusion.  Was followed at rehab.  Remains confused.  Awaiting outpatient cardiac clearance by ENT for CyberKnife therapy for schwannoma. - Continue to monitor  6.  Prediabetes: A1c 5.8.  No glycemic control at home. - Symptoms sliding scale and CBGs AC/nightly  7.  Depression  dementia: Chronic.  Per report patient has been significantly affected by the loss of his wife on 10/2016.  Patient has 20 pound weight loss with emotional difficulties by multiple providers.  Recently started on SSRI. - Continue mirtazapine 7.5 mg nightly and sertraline 100 mg daily  8.  Stage II sacral decubitus pressure injury: Uncertain as today.  Remains afebrile. - Wound care consulted  9.  GERD: Chronic.  Stable. - Omeprazole 20 mg twice daily, ezetimibe 10 mg nightly, sulcrafate 1 g 4 times daily  FEN/GI: Heart Healthy PPx: Lovenox  Disposition: Palliative consult ordered, pending SNF placement based on family's recommendations.  Palliative attempting to contact daughter  Subjective:  Patient denies complaints.    Objective: Temp:  [98.1 F (36.7 C)-98.4 F (36.9 C)] 98.4 F (36.9 C) (07/01 1223) Pulse Rate:  [64-77] 64 (07/01 1223) Resp:  [16-18] 16 (07/01 1223) BP: (86-120)/(59-92) 86/59 (07/01 1223) SpO2:  [94 %-96 %] 96 % (07/01 1223) Weight:  [71.2  kg (157 lb)] 71.2 kg (157 lb) (07/01 0431)  Physical Exam  Constitutional: He is well-developed, well-nourished, and in no distress.  HENT:  Head: Normocephalic and atraumatic.  Cardiovascular: Normal rate and regular rhythm. Exam reveals no gallop and no friction rub.  No murmur  heard. Pulmonary/Chest: Effort normal and breath sounds normal. No respiratory distress. He has no wheezes. He has no rales.  Abdominal: Soft. Bowel sounds are normal.  Neurological: He is alert.  Oriented to person only  Skin: Skin is warm and dry.    Laboratory: Recent Labs  Lab 08/05/17 1615 08/05/17 1622 08/07/17 0720 08/09/17 0650  WBC 6.9  --  7.2 7.6  HGB 13.8 14.3 13.9 15.4  HCT 43.1 42.0 42.0 46.4  PLT 175  --  191 173   Recent Labs  Lab 08/05/17 1615  08/06/17 0735 08/07/17 0720 08/09/17 0650  NA 141   < > 144 141 140  K 3.8   < > 3.4* 3.5 3.4*  CL 108   < > 106 104 104  CO2 23  --  28 26 25   BUN 10   < > 9 10 7*  CREATININE 0.91   < > 0.94 0.91 0.90  CALCIUM 9.0  --  8.8* 8.8* 8.6*  PROT 6.4*  --   --   --  5.7*  BILITOT 1.1  --   --   --  1.5*  ALKPHOS 73  --   --   --  70  ALT 22  --   --   --  15  AST 23  --   --   --  19  GLUCOSE 105*   < > 118* 89 76   < > = values in this interval not displayed.   6/28 digoxin level <0.2 6/27 troponin I <0.03 6/27 free T4 WNL 6/26 urine culture >100,000 colonies of E. coli 6/26 A1c 5.8 6/26 TSH low, 0.140 6/26 phosphorus WNL 6/26 magnesium WNL 6/26 HIV antibody nonreactive 6/26 UA AC, moderate hemoglobin, many bacteria, positive nitrite, small leukocyte, 11-20 WBC 6/26 BNP 1988.2 6/26 INR 1.17  Imaging/Diagnostic Tests: ECHO COMPLETE WO IMAGING ENHANCING AGENT (08/06/2017) - Left ventricle: The cavity size was moderately dilated. Wall   thickness was increased in a pattern of mild LVH. Systolic   function was severely reduced. The estimated ejection fraction   was in the range of 20% to 25%. Diffuse hypokinesis. There is   akinesis of the inferior myocardium. Features are consistent with   a pseudonormal left ventricular filling pattern, with concomitant   abnormal relaxation and increased filling pressure (grade 2   diastolic dysfunction). - Aortic valve: Trileaflet; mildly thickened, mildly calcified    leaflets. - Mitral valve: There was moderate regurgitation. - Left atrium: The atrium was severely dilated. Volume/bsa, ES,   (1-plane Simpson&'s, A2C): 53.1 ml/m^2. - Right ventricle: Pacer wire or catheter noted in right ventricle. - Pulmonic valve: There was moderate regurgitation. - Pulmonary arteries: Systolic pressure was mildly increased. PA   peak pressure: 36 mm Hg (S).  CHEST - 2 VIEW (08/05/2017) No active cardiopulmonary disease.  CT HEAD WITHOUT CONTRAST (08/05/2017) No acute findings. Minimal chronic ischemic microvascular disease. 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    Rittberger, Bernita Raisin, DO 08/10/2017, 2:19 PM PGY-1, [facility] Intern pager: [phone], text pages welcome

## 2017-08-10 NOTE — Progress Notes (Signed)
Paged recheck of bp to pager [phone], bp 88/58, map 68, hr 66, pt asleep and awoken to reposition

## 2017-08-11 DIAGNOSIS — Z7189 Other specified counseling: Secondary | ICD-10-CM

## 2017-08-11 LAB — BASIC METABOLIC PANEL
ANION GAP: 11 (ref 5–15)
BUN: 7 mg/dL — ABNORMAL LOW (ref 8–23)
CALCIUM: 8.9 mg/dL (ref 8.9–10.3)
CHLORIDE: 102 mmol/L (ref 98–111)
CO2: 28 mmol/L (ref 22–32)
CREATININE: 0.98 mg/dL (ref 0.61–1.24)
GFR calc Af Amer: >60 mL/min (ref >60)
GFR calc non Af Amer: >60 mL/min (ref >60)
Glucose, Bld: 82 mg/dL (ref 70–99)
Potassium: 3.1 mmol/L — ABNORMAL LOW (ref 3.5–5.1)
SODIUM: 141 mmol/L (ref 135–145)

## 2017-08-11 LAB — CBC
HEMATOCRIT: 44.2 % (ref 39.0–52.0)
Hemoglobin: 14.5 g/dL (ref 13.0–17.0)
MCH: 29.8 pg (ref 26.0–34.0)
MCHC: 32.8 g/dL (ref 30.0–36.0)
MCV: 90.8 fL (ref 78.0–100.0)
PLATELETS: 194 10*3/uL (ref 150–400)
RBC: 4.87 MIL/uL (ref 4.22–5.81)
RDW: 17.1 % — AB (ref 11.5–15.5)
WBC: 9.2 10*3/uL (ref 4.0–10.5)

## 2017-08-11 LAB — GLUCOSE, CAPILLARY
GLUCOSE-CAPILLARY: 110 mg/dL — AB (ref 70–99)
Glucose-Capillary: 116 mg/dL — ABNORMAL HIGH (ref 70–99)
Glucose-Capillary: 134 mg/dL — ABNORMAL HIGH (ref 70–99)
Glucose-Capillary: 93 mg/dL (ref 70–99)

## 2017-08-11 MED ORDER — CARVEDILOL 6.25 MG PO TABS
6.2500 mg | ORAL_TABLET | Freq: Two times a day (BID) | ORAL | Status: DC
  Administered 2017-08-11 – 2017-08-14 (×7): 6.25 mg via ORAL
  Filled 2017-08-11 (×7): qty 1

## 2017-08-11 MED ORDER — POTASSIUM CHLORIDE CRYS ER 20 MEQ PO TBCR
40.0000 meq | EXTENDED_RELEASE_TABLET | Freq: Once | ORAL | Status: AC
  Administered 2017-08-11: 40 meq via ORAL
  Filled 2017-08-11: qty 2

## 2017-08-11 NOTE — Progress Notes (Signed)
Progress Note  Patient Name: Ronnie Nelson Date of Encounter: 08/11/2017  Primary Cardiologist: No primary care provider on file.   Subjective   He is more alert today and was able to identify me as "C".  He still does not realize he is in the hospital.  Inpatient Medications    Scheduled Meds: . amantadine  100 mg Oral BID  . aspirin EC  81 mg Oral Daily  . carvedilol  6.25 mg Oral BID WC  . cephALEXin  250 mg Oral Q6H  . digoxin  0.0625 mg Oral Daily  . donepezil  5 mg Oral QHS  . enoxaparin (LOVENOX) injection  40 mg Subcutaneous Q24H  . ezetimibe  10 mg Oral QHS  . insulin aspart  0-9 Units Subcutaneous TID WC  . liver oil-zinc oxide   Topical BID  . mirtazapine  7.5 mg Oral QHS  . pantoprazole  40 mg Oral BID  . ranolazine  500 mg Oral BID  . rosuvastatin  20 mg Oral q1800  . sertraline  100 mg Oral Daily   Continuous Infusions:  PRN Meds: acetaminophen **OR** acetaminophen   Vital Signs    Vitals:   08/11/17 0753 08/11/17 1012 08/11/17 1039 08/11/17 1137  BP: (!) 132/106 99/61  106/75  Pulse: 81 76  79  Resp: 16     Temp:   97.8 F (36.6 C)   TempSrc:   Oral   SpO2: 98%     Weight:      Height:        Intake/Output Summary (Last 24 hours) at 08/11/2017 1202 Last data filed at 08/11/2017 1100 Gross per 24 hour  Intake 1330 ml  Output 475 ml  Net 855 ml   Filed Weights   08/07/17 0216 08/07/17 1025 08/10/17 0431  Weight: 151 lb 7.3 oz (68.7 kg) 154 lb (69.9 kg) 157 lb (71.2 kg)    Telemetry    Frequent PVCs and occasional brief runs of nonsustained ventricular tachycardia- Personally Reviewed  ECG    No new tracing- Personally Reviewed  Physical Exam  Alert, but disoriented GEN: No acute distress.   Neck: No JVD Cardiac: RRR, toxically split second heart sound no murmurs, rubs, or gallops.  Healthy left subclavian defibrillator site Respiratory: Clear to auscultation bilaterally. GI: Soft, nontender, non-distended  MS: No edema; No  deformity. Neuro:  Nonfocal  Psych: Normal affect   Labs    Chemistry Recent Labs  Lab 08/05/17 1615  08/07/17 0720 08/09/17 0650 08/11/17 0449  NA 141   < > 141 140 141  K 3.8   < > 3.5 3.4* 3.1*  CL 108   < > 104 104 102  CO2 23   < > 26 25 28   GLUCOSE 105*   < > 89 76 82  BUN 10   < > 10 7* 7*  CREATININE 0.91   < > 0.91 0.90 0.98  CALCIUM 9.0   < > 8.8* 8.6* 8.9  PROT 6.4*  --   --  5.7*  --   ALBUMIN 3.5  --   --  3.0*  --   AST 23  --   --  19  --   ALT 22  --   --  15  --   ALKPHOS 73  --   --  70  --   BILITOT 1.1  --   --  1.5*  --   GFRNONAA >60   < > >60 >60 >60  GFRAA >60   < > >60 >60 >60  ANIONGAP 10   < > 11 11 11    < > = values in this interval not displayed.     Hematology Recent Labs  Lab 08/07/17 0720 08/09/17 0650 08/11/17 0910  WBC 7.2 7.6 9.2  RBC 4.66 5.17 4.87  HGB 13.9 15.4 14.5  HCT 42.0 46.4 44.2  MCV 90.1 89.7 90.8  MCH 29.8 29.8 29.8  MCHC 33.1 33.2 32.8  RDW 16.0* 16.1* 17.1*  PLT 191 173 194    Cardiac Enzymes Recent Labs  Lab 08/05/17 1615 08/05/17 2009 08/06/17 0221 08/06/17 0735  TROPONINI <0.03 <0.03 0.03* <0.03    Recent Labs  Lab 08/05/17 1621  TROPIPOC 0.01     BNP Recent Labs  Lab 08/05/17 1615  BNP 1,988.2*     DDimer No results for input(s): DDIMER in the last 168 hours.   Radiology    No results found.  Cardiac Studies   Echo August 06, 2017 - Left ventricle: The cavity size was moderately dilated. Wall thickness was increased in a pattern of mild LVH. Systolic function was severely reduced. The estimated ejection fraction was in the range of 20% to 25%. Diffuse hypokinesis. There is akinesis of the inferior myocardium. Features are consistent with a pseudonormal left ventricular filling pattern, with concomitant abnormal relaxation and increased filling pressure (grade 2 diastolic dysfunction). - Aortic valve: Trileaflet; mildly thickened, mildly calcified leaflets. -  Mitral valve: There was moderate regurgitation. - Left atrium: The atrium was severely dilated. Volume/bsa, ES, (1-plane Simpson&'s, A2C): 53.1 ml/m^2. - Right ventricle: Pacer wire or catheter noted in right ventricle. - Pulmonic valve: There was moderate regurgitation. - Pulmonary arteries: Systolic pressure was mildly increased. PA peak pressure: 36 mm Hg (S).  Patient Profile     72 y.o. 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  Assessment & Plan    1. CAD:  Denies angina pectoris and does not recall complaining of it.   Suspicion for acute coronary syndrome is low.  On aspirin and statin 2. CHF: Appears euvolemic on current medications.  He is on chronic therapy with Entresto and beta-blocker.  Weight has been relatively stable in the 155 lb range.  He has lost a lot of true weight in the last 6 months. 3. ICD: Normal function by recent remote download May 21.  Never received defibrillator therapies for tachycardia. 4. NSVT: Has been seen frequently on previous defibrillator downloads.   5.  Altered mental status:  Steady decline over the last 9 months or so, beginning with his wife's demise, probable depression, stroke, possible dementia.  More alert today, interactive, but clearly disoriented.  Consultations continue between the family and our palliative care service.     For questions or updates, please contact [place] Please consult [email or web address] for contact info under Cardiology/STEMI.      Signed, Sanda Klein, MD  08/11/2017, 12:02 PM

## 2017-08-11 NOTE — Progress Notes (Signed)
Occupational Therapy Treatment Patient Details Name: Ronnie Nelson MRN: [id number] DOB: 1945/03/01 Today's Date: 08/11/2017    History of present illness Pt is a 72 y.o. male presenting with chest pain and found to have CHF exacerbation. PMHx: CAD s/p CABG, HTN, Ischemic cardiomyopathy, Dementia, Depression, DM2, CVA.    OT comments  Pt able to perform sit to stand with use of Stedy x4 today with min assist +2 to boost up. Pt required mod-max assist for ADL and min assist for balance during grooming task in sitting. D/c plan remains appropriate. Will continue to follow acutely.   Follow Up Recommendations  SNF;Supervision/Assistance - 24 hour    Equipment Recommendations  Other (comment)(TBD at next venue)    Recommendations for Other Services      Precautions / Restrictions Precautions Precautions: Fall Restrictions Weight Bearing Restrictions: No       Mobility Bed Mobility Overal bed mobility: Needs Assistance Bed Mobility: Rolling;Sidelying to Sit Rolling: Max assist Sidelying to sit: Max assist       General bed mobility comments: Cues throughout for sequencing and technique. Assist for LEs and trunk elevation from supine to sit. Assist to scoot hips out to EOB  Transfers Overall transfer level: Needs assistance   Transfers: Sit to/from Stand;Stand Pivot Transfers Sit to Stand: Min assist;+2 physical assistance Stand pivot transfers: Total assist(with use of Stedy)       General transfer comment: Min assist +2 to boost up from EOB x3, chair x1. Cues for hand placement, foot placement, and technique. Total assist for transfer EOB>chair    Balance Overall balance assessment: Needs assistance Sitting-balance support: Feet supported Sitting balance-Leahy Scale: Fair Sitting balance - Comments: Min guard to min assist for sitting balance Postural control: Posterior lean Standing balance support: Bilateral upper extremity supported Standing balance-Leahy Scale:  Zero Standing balance comment: Standing in Stedy                           ADL either performed or assessed with clinical judgement   ADL Overall ADL's : Needs assistance/impaired     Grooming: Minimal assistance;Sitting;Wash/dry face Grooming Details (indicate cue type and reason): Min assist for sitting balance at EOB during grooming task         Upper Body Dressing : Moderate assistance;Sitting Upper Body Dressing Details (indicate cue type and reason): to doff/don gown         Toileting- Clothing Manipulation and Hygiene: Maximal assistance;+2 for physical assistance;Sit to/from stand Toileting - Clothing Manipulation Details (indicate cue type and reason): Utilized Stedy; max assist for peri care following BM. Pt able to assist with cleaning front in sitting       General ADL Comments: Min assist +2 for sit to stand in [place]; total assist for transfers using [employer]     Praxis      Cognition Arousal/Alertness: Awake/alert(initially lethargic) Behavior During Therapy: Flat affect Overall Cognitive Status: No family/caregiver present to determine baseline cognitive functioning Area of Impairment: Following commands;Safety/judgement;Problem solving                       Following Commands: Follows one step commands with increased time Safety/Judgement: Decreased awareness of safety;Decreased awareness of deficits   Problem Solving: Slow processing;Decreased initiation;Difficulty sequencing;Requires verbal cues;Requires tactile cues          Exercises     Shoulder Instructions  General Comments      Pertinent Vitals/ Pain       Pain Assessment: No/denies pain  Home Living                                          Prior Functioning/Environment              Frequency  Min 2X/week        Progress Toward Goals  OT Goals(current goals can now be found in the care plan  section)  Progress towards OT goals: Progressing toward goals  Acute Rehab OT Goals Patient Stated Goal: none stated OT Goal Formulation: With patient  Plan Discharge plan remains appropriate    Co-evaluation    PT/OT/SLP Co-Evaluation/Treatment: Yes Reason for Co-Treatment: For patient/therapist safety;To address functional/ADL transfers   OT goals addressed during session: ADL's and self-care      AM-PAC PT "6 Clicks" Daily Activity     Outcome Measure   Help from another person eating meals?: A Little Help from another person taking care of personal grooming?: A Little Help from another person toileting, which includes using toliet, bedpan, or urinal?: A Lot Help from another person bathing (including washing, rinsing, drying)?: A Lot Help from another person to put on and taking off regular upper body clothing?: A Lot Help from another person to put on and taking off regular lower body clothing?: A Lot 6 Click Score: 14    End of Session Equipment Utilized During Treatment: Gait belt;Other (comment)(Stedy)  OT Visit Diagnosis: Other abnormalities of gait and mobility (R26.89);Unsteadiness on feet (R26.81);Muscle weakness (generalized) (M62.81);Other symptoms and signs involving cognitive function   Activity Tolerance Patient tolerated treatment well   Patient Left in chair;with call bell/phone within reach;with chair alarm set;with nursing/sitter in room   Nurse Communication Mobility status;Need for lift equipment;Other (comment)(pt had BM during session)        Time: 7341-9379 OT Time Calculation (min): 30 min  Charges: OT General Charges $OT Visit: 1 Visit OT Treatments $Self Care/Home Management : 8-22 mins  Leanah Kolander A. Ulice Brilliant, M.S., OTR/L Acute Rehab Department: [phone]   Binnie Kand 08/11/2017, 11:47 AM

## 2017-08-11 NOTE — Progress Notes (Signed)
Family Medicine Teaching Service Daily Progress Note Intern Pager: [phone]  Patient name: Ronnie Nelson Medical record number: [id number] Date of birth: 07-18-1945 Age: 72 y.o. Gender: male  Primary Care Provider: Coy Saunas, MD Consultants: Cardiology Code Status: Full  Pt Overview and Major Events to Date:  6/26 Admit for ACS workup, found to E. Coli UTI 6/27 TTE c/w worsened EF 25% with diffuse hypokenesis, G2DD, cards rec med management  Assessment and Plan: Ronnie Nelson is a 72 y.o. male with a past medical history significant for CAD status post CABG, hypertension, ischemic cardiomyopathy, dementia, depression, type 2 diabetes (unclear), stroke who presented 6/26 complaining of chest pain and found to have CHF exacerbation.  1.  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  Stable.  Has known diffuse CAD with ICD followed by Dr. Sallyanne Kuster.  TTE with worsened EF now 25% with diffuse hypokinesis and G1DD.  Cardiology recommending Ranexa, Entresto, high intensity statin, and beta-blocker for NSVT.  Not a candidate for cardiac cath.   - Cardiology consulted, appreciate recommendations - Cardiac monitoring - Cardio recommends Entresto 24-26 mg twice daily, Ranexa 500 mg twice daily, digoxin 0.0625 mg daily, Coreg 25 mg twice daily - Holding Entresto, Coreg, and Lasix due to hypotensive episodes yesterday with BP in 80s/50s-60s - Restart BP meds as needed - Continue aspirin 81 mg daily, Crestor 20 mg daily, Zetia 10 mg daily - Palliative consulted, pending conversation with daughter - Daily weights, strict I&O's - Supplemental O2, continuous pulse ox - PT/OT recommending SNF, need to discuss with daughter - Palliative care to have meeting with daughter this AM  2.  E. coli acute cystitis: Acute. Currently on Keflex, appropriate per culture.  Foley in place for acute on chronic heart failure.  Temp of 100.5 this AM, repeat at 1039 97.8, WBC count 9.2.   -  Continue Keflex 250 mg 4 times daily, day 6 of 7 - Culture sensitivities show sensitivity to cephalosporins  3. Hypokalemia: Trending down, today 3.1, was 3.8 on admission. - K-Dur 40 mEq once. - Recheck BMP tomorrow.  4.  Subclinical hyperthyroidism: Chronic.  Free T4 normal. - Follow-up outpatient  5.  Primary hypertension: Chronic with known ICM.  Actually hypotensive today with BP 86/59 at 1223, repeat BP 88/58.  Patient re-examined, found to be somnolent.  Given 250cc bolus NS.  Patient's BP this AM 124/97. - Holding Lasix and Entresto at present due to recent history of hypotension.  Will restart as needed. - Restart Coreg 6.25mg  QD.  Will adjust prn. - Consider restarting to Lasix. - See problem #1  6.  Right occipital stroke  acoustic schwannoma: Chronic.  Hospitalized 05/2016.  Developed residual left-sided deficit in addition to confusion.  Was followed at rehab.  Remains confused.  Awaiting outpatient cardiac clearance by ENT for CyberKnife therapy for schwannoma. - Continue to monitor  7.  Prediabetes: A1c 5.8.  No glycemic control at home. - Symptoms sliding scale and CBGs AC/nightly  8.  Depression  dementia: Chronic.  Per report patient has been significantly affected by the loss of his wife on 10/2016.  Patient has 20 pound weight loss with emotional difficulties by multiple providers.  Recently started on SSRI. - Continue mirtazapine 7.5 mg nightly and sertraline 100 mg daily  9.  Stage II sacral decubitus pressure injury: Uncertain as today.  Remains afebrile. - Wound care consulted  10.  GERD: Chronic.  Stable. - Omeprazole 20  mg twice daily, ezetimibe 10 mg nightly, sulcrafate 1 g 4 times daily  FEN/GI: Heart Healthy PPx: Lovenox  Disposition: Palliative meeting this AM with daughter  Subjective:  Patient denies complaints today.  Objective: Temp:  [98.4 F (36.9 C)-100.5 F (38.1 C)] 100.5 F (38.1 C) (07/02 0405) Pulse Rate:  [64-81] 81 (07/02  0753) Resp:  [16-20] 16 (07/02 0753) BP: (86-132)/(58-106) 132/106 (07/02 0753) SpO2:  [96 %-99 %] 98 % (07/02 0753)   Physical Exam General: 72 yo male lying in bed, in NAD HEENT: NCAT Cardio: RRR, no m/r/g Lungs: CTAB, no wheezes, rhonchi, crackles Abdomen: Soft, non-tender, + bowel sounds Neuro: A&Ox2, not oriented to time Skin: warm and dry   Laboratory: Recent Labs  Lab 08/07/17 0720 08/09/17 0650 08/11/17 0910  WBC 7.2 7.6 9.2  HGB 13.9 15.4 14.5  HCT 42.0 46.4 44.2  PLT 191 173 194   Recent Labs  Lab 08/05/17 1615  08/07/17 0720 08/09/17 0650 08/11/17 0449  NA 141   < > 141 140 141  K 3.8   < > 3.5 3.4* 3.1*  CL 108   < > 104 104 102  CO2 23   < > 26 25 28   BUN 10   < > 10 7* 7*  CREATININE 0.91   < > 0.91 0.90 0.98  CALCIUM 9.0   < > 8.8* 8.6* 8.9  PROT 6.4*  --   --  5.7*  --   BILITOT 1.1  --   --  1.5*  --   ALKPHOS 73  --   --  70  --   ALT 22  --   --  15  --   AST 23  --   --  19  --   GLUCOSE 105*   < > 89 76 82   < > = values in this interval not displayed.   6/28 digoxin level <0.2 6/27 troponin I <0.03 6/27 free T4 WNL 6/26 urine culture >100,000 colonies of E. coli 6/26 A1c 5.8 6/26 TSH low, 0.140 6/26 phosphorus WNL 6/26 magnesium WNL 6/26 HIV antibody nonreactive 6/26 UA AC, moderate hemoglobin, many bacteria, positive nitrite, small leukocyte, 11-20 WBC 6/26 BNP 1988.2 6/26 INR 1.17  Imaging/Diagnostic Tests: ECHO COMPLETE WO IMAGING ENHANCING AGENT (08/06/2017) - Left ventricle: The cavity size was moderately dilated. Wall   thickness was increased in a pattern of mild LVH. Systolic   function was severely reduced. The estimated ejection fraction   was in the range of 20% to 25%. Diffuse hypokinesis. There is   akinesis of the inferior myocardium. Features are consistent with   a pseudonormal left ventricular filling pattern, with concomitant   abnormal relaxation and increased filling pressure (grade 2   diastolic  dysfunction). - Aortic valve: Trileaflet; mildly thickened, mildly calcified   leaflets. - Mitral valve: There was moderate regurgitation. - Left atrium: The atrium was severely dilated. Volume/bsa, ES,   (1-plane Simpson&'s, A2C): 53.1 ml/m^2. - Right ventricle: Pacer wire or catheter noted in right ventricle. - Pulmonic valve: There was moderate regurgitation. - Pulmonary arteries: Systolic pressure was mildly increased. PA   peak pressure: 36 mm Hg (S).  CHEST - 2 VIEW (08/05/2017) No active cardiopulmonary disease.  CT HEAD WITHOUT CONTRAST (08/05/2017) No acute findings. Minimal chronic ischemic microvascular disease. 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    Rittberger, Bernita Raisin, DO 08/11/2017, 9:47 AM PGY-1, [place] Intern pager: [phone], text pages  welcome

## 2017-08-11 NOTE — Progress Notes (Signed)
Physical Therapy Treatment Patient Details Name: Ronnie Nelson MRN: [id number] DOB: 04/27/1945 Today's Date: 08/11/2017    History of Present Illness Pt is a 72 y.o. male presenting with chest pain and found to have CHF exacerbation. PMHx: CAD s/p CABG, HTN, Ischemic cardiomyopathy, Dementia, Depression, DM2, CVA.     PT Comments    Pt showing improvement with sit<>stand transfers with use of stedy. Mod cueing throughout for technique with all mobilities. Min A for balance sitting EOB. Patient would benefit from continued skilled PT to maximize functional independence and activity tolerance. Will continue to follow acutely.    Follow Up Recommendations  SNF;Supervision/Assistance - 24 hour     Equipment Recommendations  (TBD next venue)    Recommendations for Other Services       Precautions / Restrictions Precautions Precautions: Fall Restrictions Weight Bearing Restrictions: No    Mobility  Bed Mobility Overal bed mobility: Needs Assistance Bed Mobility: Rolling;Sidelying to Sit Rolling: Max assist Sidelying to sit: Max assist       General bed mobility comments: Cues throughout for sequencing and technique. Assist for LEs and trunk elevation from supine to sit. Assist to scoot hips out to EOB  Transfers Overall transfer level: Needs assistance   Transfers: Sit to/from Stand;Stand Pivot Transfers Sit to Stand: Min assist;+2 physical assistance Stand pivot transfers: Total assist(with use of Stedy)       General transfer comment: Min assist +2 to boost up from EOB x3, chair x1. Cues for hand placement, foot placement, and technique. Total assist for transfer EOB>chair  Ambulation/Gait             General Gait Details: unable   Stairs             Wheelchair Mobility    Modified Rankin (Stroke Patients Only)       Balance Overall balance assessment: Needs assistance Sitting-balance support: Feet supported Sitting balance-Leahy Scale:  Fair Sitting balance - Comments: Min guard to min assist for sitting balance Postural control: Posterior lean Standing balance support: Bilateral upper extremity supported Standing balance-Leahy Scale: Zero Standing balance comment: Standing in Stedy                            Cognition Arousal/Alertness: Awake/alert(initially lethargic) Behavior During Therapy: Flat affect Overall Cognitive Status: No family/caregiver present to determine baseline cognitive functioning Area of Impairment: Following commands;Safety/judgement;Problem solving                       Following Commands: Follows one step commands with increased time Safety/Judgement: Decreased awareness of safety;Decreased awareness of deficits   Problem Solving: Slow processing;Decreased initiation;Difficulty sequencing;Requires verbal cues;Requires tactile cues        Exercises      General Comments        Pertinent Vitals/Pain Pain Assessment: No/denies pain    Home Living                      Prior Function            PT Goals (current goals can now be found in the care plan section) Acute Rehab PT Goals Patient Stated Goal: none stated PT Goal Formulation: With patient Time For Goal Achievement: 08/14/17 Potential to Achieve Goals: Good Progress towards PT goals: Progressing toward goals    Frequency    Min 2X/week      PT Plan Current plan remains appropriate  Co-evaluation PT/OT/SLP Co-Evaluation/Treatment: Yes Reason for Co-Treatment: For patient/therapist safety;To address functional/ADL transfers PT goals addressed during session: Mobility/safety with mobility OT goals addressed during session: ADL's and self-care      AM-PAC PT "6 Clicks" Daily Activity  Outcome Measure  Difficulty turning over in bed (including adjusting bedclothes, sheets and blankets)?: Unable Difficulty moving from lying on back to sitting on the side of the bed? :  Unable Difficulty sitting down on and standing up from a chair with arms (e.g., wheelchair, bedside commode, etc,.)?: Unable Help needed moving to and from a bed to chair (including a wheelchair)?: A Lot Help needed walking in hospital room?: Total Help needed climbing 3-5 steps with a railing? : Total 6 Click Score: 7    End of Session Equipment Utilized During Treatment: Gait belt Activity Tolerance: Patient tolerated treatment well Patient left: with call bell/phone within reach;in chair;with chair alarm set;with nursing/sitter in room Nurse Communication: Mobility status PT Visit Diagnosis: Unsteadiness on feet (R26.81);Other abnormalities of gait and mobility (R26.89);Repeated falls (R29.6);Muscle weakness (generalized) (M62.81)     Time: 5170-0174 PT Time Calculation (min) (ACUTE ONLY): 28 min  Charges:  $Therapeutic Activity: 8-22 mins                    G Codes:       Benjiman Core, [place] Pager [phone] Acute Rehab  Allena Katz 08/11/2017, 11:58 AM

## 2017-08-11 NOTE — Progress Notes (Signed)
Palliative:  Patient seen - more interactive than yesterday yet still confused. Had meeting scheduled with daughter this AM - she did not come to hospital and did not answer phone. Left voicemail and she has not called back. PMT will continue to reach out to daughter to discuss goals of care.   Thank you for involving PMT in this patient's care.  No charge.  Juel Burrow, DNP, AGNP-C Palliative Medicine Team Team Phone # [phone]  Pager # [phone]

## 2017-08-12 LAB — CBC
HEMATOCRIT: 44.6 % (ref 39.0–52.0)
Hemoglobin: 14.6 g/dL (ref 13.0–17.0)
MCH: 29.6 pg (ref 26.0–34.0)
MCHC: 32.7 g/dL (ref 30.0–36.0)
MCV: 90.5 fL (ref 78.0–100.0)
Platelets: 215 10*3/uL (ref 150–400)
RBC: 4.93 MIL/uL (ref 4.22–5.81)
RDW: 17.2 % — ABNORMAL HIGH (ref 11.5–15.5)
WBC: 8.2 10*3/uL (ref 4.0–10.5)

## 2017-08-12 LAB — BASIC METABOLIC PANEL
ANION GAP: 10 (ref 5–15)
BUN: 8 mg/dL (ref 8–23)
CALCIUM: 8.6 mg/dL — AB (ref 8.9–10.3)
CHLORIDE: 104 mmol/L (ref 98–111)
CO2: 23 mmol/L (ref 22–32)
CREATININE: 0.95 mg/dL (ref 0.61–1.24)
GFR calc Af Amer: >60 mL/min (ref >60)
GFR calc non Af Amer: >60 mL/min (ref >60)
GLUCOSE: 69 mg/dL — AB (ref 70–99)
Potassium: 4.3 mmol/L (ref 3.5–5.1)
Sodium: 137 mmol/L (ref 135–145)

## 2017-08-12 LAB — GLUCOSE, CAPILLARY
GLUCOSE-CAPILLARY: 85 mg/dL (ref 70–99)
Glucose-Capillary: 81 mg/dL (ref 70–99)
Glucose-Capillary: 101 mg/dL — ABNORMAL HIGH (ref 70–99)
Glucose-Capillary: 116 mg/dL — ABNORMAL HIGH (ref 70–99)

## 2017-08-12 NOTE — Clinical Social Work Note (Signed)
Per palliative note, plan is for home with hospice once stable for discharge.  CSW signing off. Consult again if any other social work needs arise.  Dayton Scrape, [place]

## 2017-08-12 NOTE — Care Management Important Message (Signed)
Important Message  Patient Details  Name: Ronnie Nelson MRN: [id number] Date of Birth: 25-Jul-1945   Medicare Important Message Given:  Yes Patient unable to sign, unsigned copy left at bedside   Delorse Lek 08/12/2017, 2:52 PM

## 2017-08-12 NOTE — Progress Notes (Signed)
Family Medicine Teaching Service Daily Progress Note Intern Pager: [phone]  Patient name: Ronnie Nelson Medical record number: [id number] Date of birth: 22-Jul-1945 Age: 72 y.o. Gender: male  Primary Care Provider: Coy Saunas, MD Consultants: Cardiology Code Status: Full  Pt Overview and Major Events to Date:  6/26 Admit for ACS workup, found to E. Coli UTI 6/27 TTE c/w worsened EF 25% with diffuse hypokenesis, G2DD, cards rec med management  Assessment and Plan: Ronnie Nelson is a 72 y.o. male with a past medical history significant for CAD status post CABG, hypertension, ischemic cardiomyopathy, dementia, depression, type 2 diabetes (unclear), stroke who presented 6/26 complaining of chest pain and found to have CHF exacerbation.  1.  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  Stable.  Has known diffuse CAD with ICD followed by Dr. Sallyanne Kuster.  TTE with worsened EF now 25% with diffuse hypokinesis and G1DD.  Cardiology recommending Ranexa, Entresto, high intensity statin, and beta-blocker for NSVT.  Not a candidate for cardiac cath.   - Cardiology consulted, appreciate recommendations -Stop Cardiac monitoring -  Appreciate cardiology recommendations. - Daughter spoke with palliative care, has decided to proceed with Home Hospice.  Code status discussion is continuing. - Continue Coreg 6.25mg  BID yesterday, monitor BP - Recommend symptom driven care - Continue aspirin 81 mg daily, Crestor 20 mg daily, Zetia 10 mg daily   2.  E. coli acute cystitis: Acute. Currently on Keflex, appropriate per culture.  Foley in place for acute on chronic heart failure.  Remains afebrile.   - Keflex 250 mg 4 times daily, day 7 of 7 - D/C Keflex after doses today   3. Hypokalemia: Potassium 3.8 on admission, 3.1 on 7/2.  Today 4.3. - Given K-Dur 40 mEq once 7/2. - Monitoring no longer recommended as patient will be on home hospice per palliative discussion with daughter  this AM  4.  Subclinical hyperthyroidism: Chronic.  Free T4 normal. - Follow-up outpatient as needed  5.  Primary hypertension: Chronic with known ICM.  Hypotensive 7/1 with BP in 80s/50s-60s, was given 250 cc bolus and HTN meds were held.  Patient's BP this AM 131/99. Coreg was restarted at 6.25 yesterday. BP 131/99 this AM.  - Holding Lasix and Entresto at present due to recent history of hypotension.  Will consider restarting Lasix as needed for comfort. - Continue Coreg 6.25mg  QD.  Will adjust prn. - See problem #1  6.  Right occipital stroke  acoustic schwannoma: Chronic.  Hospitalized 05/2016.  Developed residual left-sided deficit in addition to confusion.  Was followed at rehab.  Remains confused.  Awaiting outpatient cardiac clearance by ENT for CyberKnife therapy for schwannoma. - Continue to monitor  7.  Prediabetes: A1c 5.8.  No glycemic control at home. - D/C CBG monitoring   8.  Depression  dementia: Chronic.  Per report patient has been significantly affected by the loss of his wife on 10/2016.  Patient has 20 pound weight loss with emotional difficulties by multiple providers.  Recently started on SSRI. - Continue mirtazapine 7.5 mg nightly and sertraline 100 mg daily  9.  Stage II sacral decubitus pressure injury: Uncertain as today.  Remains afebrile. - Wound care consulted  10.  GERD: Chronic.  Stable. - Omeprazole 20 mg twice daily, ezetimibe 10 mg nightly, sulcrafate 1 g 4 times daily  FEN/GI: Heart Healthy PPx: Lovenox  Disposition: Home hospice  Subjective:  Patient somnolent today.  Denies  complaints.    Objective: Temp:  [98 F (36.7 C)-99 F (37.2 C)] 98.2 F (36.8 C) (07/03 1159) Pulse Rate:  [71-91] 71 (07/03 1159) Resp:  [17-20] 20 (07/03 1159) BP: (111-131)/(87-99) 115/87 (07/03 1159) SpO2:  [96 %-100 %] 100 % (07/03 1159) Weight:  [177 lb (80.3 kg)] 177 lb (80.3 kg) (07/03 0241)   Physical Exam General: 72 yo male lying in bed, in  NAD HEENT: NCAT Cardio: RRR, no m/r/g Lungs: Normal effort CTAB, no wheezes, rhonchi, crackles Abdomen: Soft, non-tender, + bowel sounds Neuro: A&O to self only  Extremities: No edema Skin: warm and dry   Laboratory: Recent Labs  Lab 08/09/17 0650 08/11/17 0910 08/12/17 0615  WBC 7.6 9.2 8.2  HGB 15.4 14.5 14.6  HCT 46.4 44.2 44.6  PLT 173 194 215   Recent Labs  Lab 08/05/17 1615  08/09/17 0650 08/11/17 0449 08/12/17 0615  NA 141   < > 140 141 137  K 3.8   < > 3.4* 3.1* 4.3  CL 108   < > 104 102 104  CO2 23   < > 25 28 23   BUN 10   < > 7* 7* 8  CREATININE 0.91   < > 0.90 0.98 0.95  CALCIUM 9.0   < > 8.6* 8.9 8.6*  PROT 6.4*  --  5.7*  --   --   BILITOT 1.1  --  1.5*  --   --   ALKPHOS 73  --  70  --   --   ALT 22  --  15  --   --   AST 23  --  19  --   --   GLUCOSE 105*   < > 76 82 69*   < > = values in this interval not displayed.   6/28 digoxin level <0.2 6/27 troponin I <0.03 6/27 free T4 WNL 6/26 urine culture >100,000 colonies of E. coli 6/26 A1c 5.8 6/26 TSH low, 0.140 6/26 phosphorus WNL 6/26 magnesium WNL 6/26 HIV antibody nonreactive 6/26 UA AC, moderate hemoglobin, many bacteria, positive nitrite, small leukocyte, 11-20 WBC 6/26 BNP 1988.2 6/26 INR 1.17  Imaging/Diagnostic Tests: ECHO COMPLETE WO IMAGING ENHANCING AGENT (08/06/2017) - Left ventricle: The cavity size was moderately dilated. Wall   thickness was increased in a pattern of mild LVH. Systolic   function was severely reduced. The estimated ejection fraction   was in the range of 20% to 25%. Diffuse hypokinesis. There is   akinesis of the inferior myocardium. Features are consistent with   a pseudonormal left ventricular filling pattern, with concomitant   abnormal relaxation and increased filling pressure (grade 2   diastolic dysfunction). - Aortic valve: Trileaflet; mildly thickened, mildly calcified   leaflets. - Mitral valve: There was moderate regurgitation. - Left atrium:  The atrium was severely dilated. Volume/bsa, ES,   (1-plane Simpson&'s, A2C): 53.1 ml/m^2. - Right ventricle: Pacer wire or catheter noted in right ventricle. - Pulmonic valve: There was moderate regurgitation. - Pulmonary arteries: Systolic pressure was mildly increased. PA   peak pressure: 36 mm Hg (S).  CHEST - 2 VIEW (08/05/2017) No active cardiopulmonary disease.  CT HEAD WITHOUT CONTRAST (08/05/2017) No acute findings. Minimal chronic ischemic microvascular disease. 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    Rittberger, Bernita Raisin, DO 08/12/2017, 2:17 PM PGY-1, [place] Intern pager: [phone], text pages welcome

## 2017-08-12 NOTE — Progress Notes (Addendum)
Patient is to go home with hospice care at discharge; VM left with daughter [name] for [facility] choices. Awaiting callback. Mindi Slicker [facility] [phone]  2:24 pm - VM left with Pam again to arrange for home hospice care; awaiting callback. Mindi Slicker [facility] [phone]

## 2017-08-12 NOTE — Progress Notes (Signed)
Progress Note  Patient Name: Ronnie Nelson Date of Encounter: 08/12/2017  Primary Cardiologist: Stasia Somero  Subjective   His level of consciousness waxes and wanes.  Reportedly he was more alert this morning, but right now he is very sleepy and confused when aroused.  Breathing very comfortably lying fully flat in bed.  Inpatient Medications    Scheduled Meds: . amantadine  100 mg Oral BID  . aspirin EC  81 mg Oral Daily  . carvedilol  6.25 mg Oral BID WC  . cephALEXin  250 mg Oral Q6H  . digoxin  0.0625 mg Oral Daily  . donepezil  5 mg Oral QHS  . enoxaparin (LOVENOX) injection  40 mg Subcutaneous Q24H  . ezetimibe  10 mg Oral QHS  . insulin aspart  0-9 Units Subcutaneous TID WC  . liver oil-zinc oxide   Topical BID  . mirtazapine  7.5 mg Oral QHS  . pantoprazole  40 mg Oral BID  . ranolazine  500 mg Oral BID  . rosuvastatin  20 mg Oral q1800  . sertraline  100 mg Oral Daily   Continuous Infusions:  PRN Meds: acetaminophen **OR** acetaminophen   Vital Signs    Vitals:   08/11/17 2100 08/12/17 0241 08/12/17 0357 08/12/17 0928  BP: (!) 114/94  (!) 131/99 (!) 127/92  Pulse: 90  91 82  Resp: 20  20   Temp: 98 F (36.7 C)  99 F (37.2 C)   TempSrc: Oral  Oral   SpO2: 100%  99%   Weight:  177 lb (80.3 kg)    Height:        Intake/Output Summary (Last 24 hours) at 08/12/2017 1132 Last data filed at 08/12/2017 0500 Gross per 24 hour  Intake 360 ml  Output -  Net 360 ml   Filed Weights   08/07/17 1025 08/10/17 0431 08/12/17 0241  Weight: 154 lb (69.9 kg) 157 lb (71.2 kg) 177 lb (80.3 kg)    Telemetry    NSR, no further NSVT. Reduced PVC burden - Personally Reviewed  ECG    No new tracing - Personally Reviewed  Physical Exam  Asleep, hard to awake GEN: No acute distress.   Neck: No JVD Cardiac: RRR, no murmurs, rubs, or gallops.  Respiratory: Clear to auscultation bilaterally. GI: Soft, nontender, non-distended  MS: No edema; No deformity. Neuro:   Nonfocal  Psych: Normal affect   Labs    Chemistry Recent Labs  Lab 08/05/17 1615  08/09/17 0650 08/11/17 0449 08/12/17 0615  NA 141   < > 140 141 137  K 3.8   < > 3.4* 3.1* 4.3  CL 108   < > 104 102 104  CO2 23   < > 25 28 23   GLUCOSE 105*   < > 76 82 69*  BUN 10   < > 7* 7* 8  CREATININE 0.91   < > 0.90 0.98 0.95  CALCIUM 9.0   < > 8.6* 8.9 8.6*  PROT 6.4*  --  5.7*  --   --   ALBUMIN 3.5  --  3.0*  --   --   AST 23  --  19  --   --   ALT 22  --  15  --   --   ALKPHOS 73  --  70  --   --   BILITOT 1.1  --  1.5*  --   --   GFRNONAA >60   < > >60 >60 >60  GFRAA >60   < > >60 >60 >60  ANIONGAP 10   < > 11 11 10    < > = values in this interval not displayed.     Hematology Recent Labs  Lab 08/09/17 0650 08/11/17 0910 08/12/17 0615  WBC 7.6 9.2 8.2  RBC 5.17 4.87 4.93  HGB 15.4 14.5 14.6  HCT 46.4 44.2 44.6  MCV 89.7 90.8 90.5  MCH 29.8 29.8 29.6  MCHC 33.2 32.8 32.7  RDW 16.1* 17.1* 17.2*  PLT 173 194 215    Cardiac Enzymes Recent Labs  Lab 08/05/17 1615 08/05/17 2009 08/06/17 0221 08/06/17 0735  TROPONINI <0.03 <0.03 0.03* <0.03    Recent Labs  Lab 08/05/17 1621  TROPIPOC 0.01     BNP Recent Labs  Lab 08/05/17 1615  BNP 1,988.2*     DDimer No results for input(s): DDIMER in the last 168 hours.   Radiology    No results found.  Cardiac Studies   Echo August 06, 2017 - Left ventricle: The cavity size was moderately dilated. Wall thickness was increased in a pattern of mild LVH. Systolic function was severely reduced. The estimated ejection fraction was in the range of 20% to 25%. Diffuse hypokinesis. There is akinesis of the inferior myocardium. Features are consistent with a pseudonormal left ventricular filling pattern, with concomitant abnormal relaxation and increased filling pressure (grade 2 diastolic dysfunction). - Aortic valve: Trileaflet; mildly thickened, mildly calcified leaflets. - Mitral valve: There  was moderate regurgitation. - Left atrium: The atrium was severely dilated. Volume/bsa, ES, (1-plane Simpson&'s, A2C): 53.1 ml/m^2. - Right ventricle: Pacer wire or catheter noted in right ventricle. - Pulmonic valve: There was moderate regurgitation. - Pulmonary arteries: Systolic pressure was mildly increased. PA peak pressure: 36 mm Hg (S).   Patient Profile     72 y.o. 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  Assessment & Plan    1. CAD: no angina complaints 2. CHF: no evidence of hypervolemia 3. ICD: if he and family decide on palliative care approach, we should disable tachy therapies. 4. NSVT:  Markedly reduced burden of ventricular arrhythmia in last couple of days. Over the years, he has often had NSVT.  Planned family meeting to discuss goals of care did not take place. Will follow along until a long term care plan is in place, but do not plan additional studies or change in therapy at this time.    For questions or updates, please contact [facility] Please consult [email or web address] for contact info under Cardiology/STEMI.      Signed, Sanda Klein, MD  08/12/2017, 11:32 AM

## 2017-08-12 NOTE — Progress Notes (Signed)
Daily Progress Note   Patient Name: Ronnie Nelson       Date: 08/12/2017 DOB: May 06, 1945  Age: 72 y.o. MRN#: [id number] Attending Physician: Lind Covert, MD Primary Care Physician: Coy Saunas, MD Admit Date: 08/05/2017  Reason for Consultation/Follow-up: Establishing goals of care and Hospice Evaluation  Subjective: Patient looks at me and mutters a few unintelligible words, does not follow commands or answer questions, no family at bedside  Length of Stay: 7  Current Medications: Scheduled Meds:  . amantadine  100 mg Oral BID  . aspirin EC  81 mg Oral Daily  . carvedilol  6.25 mg Oral BID WC  . cephALEXin  250 mg Oral Q6H  . digoxin  0.0625 mg Oral Daily  . donepezil  5 mg Oral QHS  . enoxaparin (LOVENOX) injection  40 mg Subcutaneous Q24H  . ezetimibe  10 mg Oral QHS  . insulin aspart  0-9 Units Subcutaneous TID WC  . liver oil-zinc oxide   Topical BID  . mirtazapine  7.5 mg Oral QHS  . pantoprazole  40 mg Oral BID  . ranolazine  500 mg Oral BID  . rosuvastatin  20 mg Oral q1800  . sertraline  100 mg Oral Daily    Continuous Infusions:   PRN Meds: acetaminophen **OR** acetaminophen  Physical Exam  Constitutional: He appears lethargic. He appears cachectic. He has a sickly appearance. No distress.  HENT:  Head: Normocephalic and atraumatic.  Cardiovascular: Normal rate and regular rhythm.  Pulmonary/Chest: Effort normal.  Clear breath sounds anteriorly  Abdominal: Soft. Bowel sounds are normal.  Neurological: He appears lethargic. He is disoriented.  Skin: Skin is warm and dry.  Psychiatric: Cognition and memory are impaired.   Vital Signs: BP (!) 127/92 (BP Location: Left Arm)   Pulse 82   Temp 99 F (37.2 C) (Oral)   Resp 20   Ht 5\' 7"  (1.702 m)   Wt 80.3 kg  (177 lb)   SpO2 99%   BMI 27.72 kg/m  SpO2: SpO2: 99 % O2 Device: O2 Device: Room Air O2 Flow Rate:    Intake/output summary:   Intake/Output Summary (Last 24 hours) at 08/12/2017 1048 Last data filed at 08/12/2017 0500 Gross per 24 hour  Intake 600 ml  Output -  Net 600 ml   LBM: Last BM Date: 08/11/17 Baseline Weight: Weight: 70.3 kg (155 lb) Most recent weight: Weight: 80.3 kg (177 lb)       Palliative Assessment/Data: PPS 40%    Flowsheet Rows     Most Recent Value  Intake Tab  Referral Department  Hospitalist  Unit at Time of Referral  Cardiac/Telemetry Unit  Palliative Care Primary Diagnosis  Cardiac  Date Notified  08/09/17  Palliative Care Type  New Palliative care  Reason for referral  Clarify Goals of Care  Date of Admission  08/05/17  Date first seen by Palliative Care  08/10/17  # of days Palliative referral response time  1 Day(s)  # of days IP prior to Palliative referral  4  Clinical Assessment  Palliative Performance Scale Score  30%  Psychosocial & Spiritual Assessment  Palliative Care Outcomes  Patient/Family meeting held?  No  Patient Active Problem List   Diagnosis Date Noted  . Goals of care, counseling/discussion   . Palliative care by specialist   . CHF exacerbation ([place]) 08/05/2017  . Chest pain at rest   . Dementia with behavioral disturbance   . Sepsis ([place]) 06/20/2017  . Acute encephalopathy 06/20/2017  . 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 ICD (implantable cardioverter-defibrillator) lead failure 05/09/2014  . DM2 (diabetes mellitus, type 2) ([place]) 11/14/2012  . HTN (hypertension) 11/14/2012  . CAD (coronary artery disease) 11/14/2012  . Cardiomyopathy, ischemic 11/14/2012  . Chronic systolic CHF (congestive heart failure) ([place]) 11/14/2012  . NSVT (nonsustained ventricular tachycardia) ([place]) 11/14/2012  . ICD (implantable cardioverter-defibrillator) in place 11/14/2012  .  Hyperlipidemia 11/14/2012  . Low TSH level 11/14/2012    Palliative Care Assessment & Plan   HPI: 72 y.o. male  with past medical history of CAD s/p CABG in 1996, HTN, ischemic cardiomyopathy, dementia, depression, T2DM, stroke, and acoustic neuroma admitted on 08/05/2017 with atypical chest pain and found to have CHF exacerbation. Echo revealed worsened EF now 25%. Has known diffuse CAD with ICD followed by Dr. Sallyanne Kuster. Not a cardiac cath candidate. Also found to have E. Coli UTI. Per report, patient has been significantly affected by the loss of his wife on is 11/17/16.Patient has had 20 pound weight loss since then. Has stage 2 sacral decub.   Per cardiology, Mr. Benning decline began when his wife passed away last November 18, 2022.  She had been making sure that he was receiving medications appropriately and he would come to his appointments.  It is possible that when she passed, his cognitive problems were unmasked.  Depression also probably played a significant part in his deterioration.  He drastically worsened following a stroke in April.  PMT consult for [place].  Assessment: I have reviewed medical records including EPIC notes, labs and imaging, received report from MD, assessed the patient and then spoke with patient's daughter, Ronnie Nelson,  to discuss diagnosis prognosis, GOC, EOL wishes, disposition and options.  Per patient's daughter, she has been unable to come to the hospital because of her own health and transportation issues.   I introduced Palliative Medicine as specialized medical care for people living with serious illness. It focuses on providing relief from the symptoms and stress of a serious illness. The goal is to improve quality of life for both the patient and the family.  Patient lives at home alone but has family with him 24/7 to care for him. His wife passed in 2022-11-18 and he has declined significantly since then. They were married for 50 years. His daughter, Ronnie Nelson, has  become his main caregiver. Her son and the patient's sisters also help care for him.   As far as functional and nutritional status, she tells me of a decline. Patient is not able to complete any ADLs independently except for feeding himself once his meal is set up for him. He is sponge bathed by his daughter and frequently has incontinent episodes. Over the past few weeks, his appetite has significantly declined per daughter. He has lost about 20 pounds over the past few months. She tells me he has "good and bad days". She feels he is "moody" and sometimes is difficult to care for d/t confusion.    We discussed their current illness and what it means in the larger context of their on-going co-morbidities.  Natural disease trajectory and expectations at EOL were discussed. We discussed significant functional and cognitive decline  and issues with his heart.   I attempted to elicit values and goals of care important to the patient.  She tells me if he could tells [place] what he wants he would say he "wants to go be with his wife".   The difference between aggressive medical intervention and comfort care was considered in light of the patient's goals of care. She says he has lived a good life and she wants him to be comfortable.   Advanced directives, concepts specific to code status, artifical feeding and hydration, and rehospitalization were considered and discussed. Despite daughter being able to verbalize that she wants to focus on the patient's comfort, the code status discussion was very hard for her. We discussed this at length and I addressed multiple questions and concerns. She says if he could tell us what he wanted then he would want to be allowed to pass naturally. We discussed honoring his wishes as his surrogate Media planner. She is still struggling with this and requests more time to think about it before deciding his code status. Discussed by default he is full code currently. We discussed that  her father is at high risk for rehospitalization d/t his multiple medical issues. She tells me that she wants to keep him at home and avoid further hospitalizations.   We discussed that rehab is recommended by PT but she shares that she promised him she would keep him at home and never place him in a facility. She shares that she kept her mother at home and intends to do the same with her father. She also feels that he is too weak for rehab and shares that the way he is right not is "as good as its going to get".  Hospice and Palliative Care services outpatient were explained and offered. Discussed hospice philosophy of care and type of care provided. She has had other family members under hospice care and reports a positive experience. She is interested in hospice care at home for her dad.   Questions and concerns were addressed. The family was encouraged to call with questions or concerns.   Recommendations/Plan: - case manager consult for hospice care at home - daughter undecided about code status - admits her father would desire to pass naturally but she is struggling with making this decision and wants more time to think about it **- Per cardiology, If the decision is made to proceed with palliative care only, we should turn off ventricular tachycardia/ventricular fibrillation detection and therapies**  Code Status:  Full code  - continue discussions with daughter  Prognosis:   Unable to determine - poor prognosis r/t poor functional and cognitive status, PPS 40%, heart disease and daughter's desire to focus on comfort and not aggressive medical interventions  Discharge Planning:  Home with Hospice  Care plan was discussed with primary team and patient's daughter  Thank you for allowing the Palliative Medicine Team to assist in the care of this patient.   Time In: 0930 Time Out: 1130 Total Time 120 minutes Prolonged Time Billed  yes       Greater than 50%  of this time was spent  counseling and coordinating care related to the above assessment and plan.  Juel Burrow, DNP, [facility] Palliative Medicine Team Team Phone # [phone]  Pager [phone]

## 2017-08-13 LAB — GLUCOSE, CAPILLARY
GLUCOSE-CAPILLARY: 76 mg/dL (ref 70–99)
Glucose-Capillary: 86 mg/dL (ref 70–99)

## 2017-08-13 NOTE — Progress Notes (Addendum)
8:46 am -VM left with [name] (Daughter) to arrange for home hospice. Awaiting callback; Aneta Mins [phone]  10:10 am - TCT patient's sister, she will try to get in touch with the daughter for home hospice choices. Mindi Slicker RN,MHA,BSN

## 2017-08-13 NOTE — Plan of Care (Signed)
  Problem: Activity: Goal: Risk for activity intolerance will decrease Outcome: Progressing   

## 2017-08-13 NOTE — Progress Notes (Signed)
Progress Note  Patient Name: Ronnie Nelson Date of Encounter: 08/13/2017  Primary Cardiologist: No primary care provider on file.   Subjective   Alert today. Enjoying breakfast.  Inpatient Medications    Scheduled Meds: . amantadine  100 mg Oral BID  . aspirin EC  81 mg Oral Daily  . carvedilol  6.25 mg Oral BID WC  . digoxin  0.0625 mg Oral Daily  . donepezil  5 mg Oral QHS  . enoxaparin (LOVENOX) injection  40 mg Subcutaneous Q24H  . ezetimibe  10 mg Oral QHS  . insulin aspart  0-9 Units Subcutaneous TID WC  . liver oil-zinc oxide   Topical BID  . mirtazapine  7.5 mg Oral QHS  . pantoprazole  40 mg Oral BID  . ranolazine  500 mg Oral BID  . rosuvastatin  20 mg Oral q1800  . sertraline  100 mg Oral Daily   Continuous Infusions:  PRN Meds: acetaminophen **OR** acetaminophen   Vital Signs    Vitals:   08/12/17 0928 08/12/17 1159 08/12/17 1905 08/13/17 0644  BP: (!) 127/92 115/87 101/74 (!) 131/102  Pulse: 82 71 82 98  Resp:  20 18 20   Temp:  98.2 F (36.8 C) (!) 97 F (36.1 C) 98.1 F (36.7 C)  TempSrc:    Oral  SpO2:  100% 93% 100%  Weight:    176 lb (79.8 kg)  Height:        Intake/Output Summary (Last 24 hours) at 08/13/2017 0837 Last data filed at 08/13/2017 0700 Gross per 24 hour  Intake 480 ml  Output 300 ml  Net 180 ml   Filed Weights   08/10/17 0431 08/12/17 0241 08/13/17 0644  Weight: 157 lb (71.2 kg) 177 lb (80.3 kg) 176 lb (79.8 kg)    Telemetry    NSR w very frequent PVCs - Personally Reviewed  ECG    No new tracing - Personally Reviewed  Physical Exam  Alert, partially oriented (person and city, not "hospital" or day) GEN: No acute distress.   Neck: No JVD Cardiac: RRR baseline w frequent ectopy, no murmurs, rubs, or gallops.  Respiratory: Clear to auscultation bilaterally. GI: Soft, nontender, non-distended  MS: No edema; No deformity. Neuro:  Nonfocal  Psych: Normal affect   Labs    Chemistry Recent Labs  Lab  08/09/17 0650 08/11/17 0449 08/12/17 0615  NA 140 141 137  K 3.4* 3.1* 4.3  CL 104 102 104  CO2 25 28 23   GLUCOSE 76 82 69*  BUN 7* 7* 8  CREATININE 0.90 0.98 0.95  CALCIUM 8.6* 8.9 8.6*  PROT 5.7*  --   --   ALBUMIN 3.0*  --   --   AST 19  --   --   ALT 15  --   --   ALKPHOS 70  --   --   BILITOT 1.5*  --   --   GFRNONAA >60 >60 >60  GFRAA >60 >60 >60  ANIONGAP 11 11 10      Hematology Recent Labs  Lab 08/09/17 0650 08/11/17 0910 08/12/17 0615  WBC 7.6 9.2 8.2  RBC 5.17 4.87 4.93  HGB 15.4 14.5 14.6  HCT 46.4 44.2 44.6  MCV 89.7 90.8 90.5  MCH 29.8 29.8 29.6  MCHC 33.2 32.8 32.7  RDW 16.1* 17.1* 17.2*  PLT 173 194 215    Cardiac EnzymesNo results for input(s): TROPONINI in the last 168 hours. No results for input(s): TROPIPOC in the last 168  hours.   BNPNo results for input(s): BNP, PROBNP in the last 168 hours.   DDimer No results for input(s): DDIMER in the last 168 hours.   Radiology    No results found.  Cardiac Studies   Echo August 06, 2017 - Left ventricle: The cavity size was moderately dilated. Wall thickness was increased in a pattern of mild LVH. Systolic function was severely reduced. The estimated ejection fraction was in the range of 20% to 25%. Diffuse hypokinesis. There is akinesis of the inferior myocardium. Features are consistent with a pseudonormal left ventricular filling pattern, with concomitant abnormal relaxation and increased filling pressure (grade 2 diastolic dysfunction). - Aortic valve: Trileaflet; mildly thickened, mildly calcified leaflets. - Mitral valve: There was moderate regurgitation. - Left atrium: The atrium was severely dilated. Volume/bsa, ES, (1-plane Simpson&'s, A2C): 53.1 ml/m^2. - Right ventricle: Pacer wire or catheter noted in right ventricle. - Pulmonic valve: There was moderate regurgitation. - Pulmonary arteries: Systolic pressure was mildly increased. PA peak pressure: 36 mm Hg  (S).     Patient Profile     72 y.o. 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    Assessment & Plan    1. CAD: no angina  2. CHF: clinically euvolemic 3. ICD: note plan for hospice as inpatient. ICD tachy detection and therapies were PROGRAMMED OFF. Backup pacing is on. 4. NSVT:  no significant NSVT recorded by device since June 27.Marland Kitchen   [facility] will sign off.   Medication Recommendations:  Continue current meds Other recommendations (labs, testing, etc):  None, can DC device monitoring Follow up as an outpatient:  If necessary.  For questions or updates, please contact [place] Please consult [email or web address] for contact info under Cardiology/STEMI.      Signed, Sanda Klein, MD  08/13/2017, 8:37 AM

## 2017-08-13 NOTE — Progress Notes (Signed)
Digoxin requested from pharmacy. Not available on unit to administer to pt.

## 2017-08-13 NOTE — Progress Notes (Signed)
ICD interrogation performed. VT/VF detection and therapies and the related alerts programmed OFF in view of poor prognosis and plan for inpatient hospice care.  Sanda Klein, MD, [facility] [phone] office [phone] pager

## 2017-08-13 NOTE — Progress Notes (Signed)
Patient resting in bed with eyes open off and on. Meds taken without difficulty. Patient sleeps majority of time.  Turns and repositions self as desired. Marland Kitchen

## 2017-08-13 NOTE — Progress Notes (Signed)
Family Medicine Teaching Service Daily Progress Note Intern Pager: [phone]  Patient name: Ronnie Nelson Medical record number: [id number] Date of birth: 1945/07/28 Age: 72 y.o. Gender: male  Primary Care Provider: Coy Saunas, MD Consultants: Cardiology Code Status: Full  Pt Overview and Major Events to Date:  6/26 Admit for ACS workup, found to E. Coli UTI 6/27 TTE c/w worsened EF 25% with diffuse hypokenesis, G2DD, cards rec med management 7/3 Decision made to proceed with home hospice  Assessment and Plan: Ronnie Nelson is a 72 y.o. male with a past medical history significant for CAD status post CABG, hypertension, ischemic cardiomyopathy, dementia, depression, type 2 diabetes (unclear), stroke who presented 6/26 complaining of chest pain and found to have CHF exacerbation.  1.  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  Stable.  Has known diffuse CAD with ICD followed by Dr. Sallyanne Kuster.  TTE with worsened EF now 25% with diffuse hypokinesis and G1DD.  Cardiology recommending Ranexa, Entresto, high intensity statin, and beta-blocker for NSVT.  Not a candidate for cardiac cath.   - Cardiology consulted, recommends disabling tachycardia therapies following decision for hospice, turning off defibrillator -Stop Cardiac monitoring -  Appreciate cardiology recommendations. - Daughter spoke with palliative care, has decided to proceed with Home Hospice.  Code status discussion is continuing.  Waiting for home health choice from daughter. - Continue Coreg 6.25mg  BID yesterday, monitor BP - Recommend symptom driven care, D/C Crestor - Continue aspirin 81 mg daily, Zetia 10 mg daily   2.  E. coli acute cystitis: Acute. S/P 7 course of Keflex, appropriate per culture.  Foley in place for acute on chronic heart failure.  Remains afebrile.   - S/P 7 days Keflex    3. Hypokalemia: Resolved.  Potassium 3.8 on admission, 3.1 on 7/2.  Given K-Dur 40 meQ, repeat K on  7/3 was 4.3. - Monitoring no longer recommended as patient will be on home hospice per palliative discussion with daughter this AM  4.  Subclinical hyperthyroidism: Chronic.  Free T4 normal. - Follow-up outpatient as needed  5.  Primary hypertension: Chronic with known ICM.  Hypotensive 7/1 with BP in 80s/50s-60s, was given 250 cc bolus and HTN meds were held.  Patient's BP this 129/88 at 1100. Coreg was restarted at 6.25 on 7/2. - Holding Lasix and Entresto at present due to recent history of hypotension.  Will consider restarting Lasix as needed for comfort. - Continue Coreg 6.25mg  QD.  Will adjust prn. - See problem #1  6.  Right occipital stroke  acoustic schwannoma: Chronic.  Hospitalized 05/2016.  Developed residual left-sided deficit in addition to confusion.  Was followed at rehab.  Remains confused.  Awaiting outpatient cardiac clearance by ENT for CyberKnife therapy for schwannoma. - Continue to monitor  7.  Prediabetes: A1c 5.8.  No glycemic control at home. - D/C CBG monitoring - D/C SSI  8.  Depression  dementia: Chronic.  Per report patient has been significantly affected by the loss of his wife on 10/2016.  Patient has 20 pound weight loss with emotional difficulties by multiple providers.  Recently started on SSRI. - Continue Sertraline 100 mg daily - D/C Mirtazapine, due to comfort care  9.  Stage II sacral decubitus pressure injury: Uncertain as today.  Remains afebrile. - Wound care consulted  10.  GERD: Chronic.  Stable. - Omeprazole 20 mg twice daily, ezetimibe 10 mg nightly, sulcrafate 1 g 4 times daily  FEN/GI: Heart Healthy PPx: Lovenox  Disposition: Home hospice  Subjective:  Patient lying in bed asleep.  Arouses, but mumbles intelligible phrases.  Objective: Temp:  [97 F (36.1 C)-98.2 F (36.8 C)] 97 F (36.1 C) (07/03 1905) Pulse Rate:  [71-82] 82 (07/03 1905) Resp:  [18-20] 18 (07/03 1905) BP: (101-127)/(74-92) 101/74 (07/03 1905) SpO2:   [93 %-100 %] 93 % (07/03 1905)\  Physical Exam General: 72 yo male lying in bed, somnolent, but arousable in NAD HEENT: NCAT Cardio: RRR, no m/r/g Lungs: No increased WOB, CTAB, no wheezes, rhonchi, crackles Abdomen: Soft, non-tender, + bowel sounds Neuro: A&O to self only Extremities: No edema Skin: warm and dry   Laboratory: Recent Labs  Lab 08/09/17 0650 08/11/17 0910 08/12/17 0615  WBC 7.6 9.2 8.2  HGB 15.4 14.5 14.6  HCT 46.4 44.2 44.6  PLT 173 194 215   Recent Labs  Lab 08/09/17 0650 08/11/17 0449 08/12/17 0615  NA 140 141 137  K 3.4* 3.1* 4.3  CL 104 102 104  CO2 25 28 23   BUN 7* 7* 8  CREATININE 0.90 0.98 0.95  CALCIUM 8.6* 8.9 8.6*  PROT 5.7*  --   --   BILITOT 1.5*  --   --   ALKPHOS 70  --   --   ALT 15  --   --   AST 19  --   --   GLUCOSE 76 82 69*   6/28 digoxin level <0.2 6/27 troponin I <0.03 6/27 free T4 WNL 6/26 urine culture >100,000 colonies of E. coli 6/26 A1c 5.8 6/26 TSH low, 0.140 6/26 phosphorus WNL 6/26 magnesium WNL 6/26 HIV antibody nonreactive 6/26 UA AC, moderate hemoglobin, many bacteria, positive nitrite, small leukocyte, 11-20 WBC 6/26 BNP 1988.2 6/26 INR 1.17  Imaging/Diagnostic Tests: ECHO COMPLETE WO IMAGING ENHANCING AGENT (08/06/2017) - Left ventricle: The cavity size was moderately dilated. Wall   thickness was increased in a pattern of mild LVH. Systolic   function was severely reduced. The estimated ejection fraction   was in the range of 20% to 25%. Diffuse hypokinesis. There is   akinesis of the inferior myocardium. Features are consistent with   a pseudonormal left ventricular filling pattern, with concomitant   abnormal relaxation and increased filling pressure (grade 2   diastolic dysfunction). - Aortic valve: Trileaflet; mildly thickened, mildly calcified   leaflets. - Mitral valve: There was moderate regurgitation. - Left atrium: The atrium was severely dilated. Volume/bsa, ES,   (1-plane Simpson&'s,  A2C): 53.1 ml/m^2. - Right ventricle: Pacer wire or catheter noted in right ventricle. - Pulmonic valve: There was moderate regurgitation. - Pulmonary arteries: Systolic pressure was mildly increased. PA   peak pressure: 36 mm Hg (S).  CHEST - 2 VIEW (08/05/2017) No active cardiopulmonary disease.  CT HEAD WITHOUT CONTRAST (08/05/2017) No acute findings. Minimal chronic ischemic microvascular disease. 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    Rittberger, Bernita Raisin, DO 08/13/2017, 6:25 AM PGY-1, [place] Intern pager: [phone], text pages welcome

## 2017-08-14 LAB — GLUCOSE, CAPILLARY
GLUCOSE-CAPILLARY: 124 mg/dL — AB (ref 70–99)
Glucose-Capillary: 73 mg/dL (ref 70–99)

## 2017-08-14 MED ORDER — CARVEDILOL 6.25 MG PO TABS: 6.2500 mg | ORAL_TABLET | Freq: Two times a day (BID) | ORAL | 0 refills | Status: AC

## 2017-08-14 MED ORDER — RANOLAZINE ER 500 MG PO TB12: 500.0000 mg | ORAL_TABLET | Freq: Two times a day (BID) | ORAL | 0 refills | Status: DC

## 2017-08-14 NOTE — Progress Notes (Signed)
Family Medicine Teaching Service Daily Progress Note Intern Pager: [phone]  Patient name: Ronnie Nelson Medical record number: [id number] Date of birth: 10-Jun-1945 Age: 72 y.o. Gender: male  Primary Care Provider: Coy Saunas, MD Consultants: Cardiology Code Status: Full  Pt Overview and Major Events to Date:  6/26 Admit for ACS workup, found to E. Coli UTI 6/27 TTE c/w worsened EF 25% with diffuse hypokenesis, G2DD, cards rec med management 7/3 Decision made to proceed with home hospice  Assessment and Plan: Ronnie Nelson is a 72 y.o. male with a past medical history significant for CAD status post CABG, hypertension, ischemic cardiomyopathy, dementia, depression, type 2 diabetes (unclear), stroke who presented 6/26 complaining of chest pain and found to have CHF exacerbation.  1.  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  Stable.  Has known diffuse CAD with ICD followed by Dr. Sallyanne Kuster.  TTE with worsened EF now 25% with diffuse hypokinesis and G1DD.  Cardiology recommending Ranexa, Entresto, high intensity statin, and beta-blocker for NSVT.  Not a candidate for cardiac cath.   - Cardiology consulted, recommends disabling tachycardia therapies following decision for hospice, turning off defibrillator -Stop Cardiac monitoring -  Appreciate cardiology recommendations. - Daughter spoke with palliative care, has decided to proceed with Home Hospice.  Code status discussion is continuing.  Waiting for home health choice from daughter, palliative notes difficulty reaching her. - Continue Coreg 6.25mg  BID, monitor BP - Recommend symptom driven care, D/C Crestor - Continue aspirin 81 mg daily, Zetia 10 mg daily   2.  E. coli acute cystitis: Acute. S/P 7 course of Keflex, appropriate per culture.  Foley in place for acute on chronic heart failure.  Remains afebrile.   - S/P 7 days Keflex   3. Hypokalemia: Resolved.  Potassium 3.8 on admission, 3.1 on 7/2.   Given K-Dur 40 meQ, repeat K on 7/3 was 4.3. - Monitoring no longer recommended as patient will be on home hospice per palliative discussion with daughter this AM  4.  Subclinical hyperthyroidism: Chronic.  Free T4 normal. - Follow-up outpatient as needed  5.  Primary hypertension: Chronic with known ICM.  Hypotensive 7/1 with BP in 80s/50s-60s, was given 250 cc bolus and HTN meds were held.  Patient's BP this 137/102 at 0555. Coreg was restarted at 6.25 on 7/2. - Holding Lasix and Entresto at present due to recent history of hypotension.  Will consider restarting Lasix as needed for comfort. - Continue Coreg 6.25mg  QD.  Will adjust prn. - See problem #1  6.  Right occipital stroke  acoustic schwannoma: Chronic.  Hospitalized 05/2016.  Developed residual left-sided deficit in addition to confusion.  Was followed at rehab.  Remains confused.  Awaiting outpatient cardiac clearance by ENT for CyberKnife therapy for schwannoma. - Continue to monitor  7.  Prediabetes: A1c 5.8.  No glycemic control at home. - D/C CBG monitoring - D/C SSI  8.  Depression  dementia: Chronic.  Per report patient has been significantly affected by the loss of his wife on 10/2016.  Patient has 20 pound weight loss with emotional difficulties by multiple providers.  Recently started on SSRI. - Continue Sertraline 100 mg daily - D/C Mirtazapine, due to comfort care  9.  Stage II sacral decubitus pressure injury: Uncertain as today.  Remains afebrile. - Wound care consulted  10.  GERD: Chronic.  Stable. - Omeprazole 20 mg twice daily, ezetimibe 10 mg nightly, sulcrafate 1 g  4 times daily  FEN/GI: Heart Healthy PPx: Lovenox  Disposition: Home hospice  Subjective:  Patient more awake today, lying in bed.  Denies complaints.  Objective: Temp:  [98 F (36.7 C)-98.3 F (36.8 C)] 98.1 F (36.7 C) (07/05 0555) Pulse Rate:  [88-103] 103 (07/05 0555) Resp:  [18-20] 18 (07/05 0555) BP: (112-137)/(88-102)  137/102 (07/05 0555) SpO2:  [98 %-100 %] 98 % (07/05 0555) Weight:  [174 lb (78.9 kg)-176 lb (79.8 kg)] 174 lb (78.9 kg) (07/05 0551)\  Physical Exam: General: 72 yo male, in NAD HEENT: NCAT Cardio: RRR no m/r/g Lungs: CTAB, normal effort, no crackles Abdomen: Soft, non-tender, + bowel sounds Extremities: No edema Skin: warm and dry    Laboratory: Recent Labs  Lab 08/09/17 0650 08/11/17 0910 08/12/17 0615  WBC 7.6 9.2 8.2  HGB 15.4 14.5 14.6  HCT 46.4 44.2 44.6  PLT 173 194 215   Recent Labs  Lab 08/09/17 0650 08/11/17 0449 08/12/17 0615  NA 140 141 137  K 3.4* 3.1* 4.3  CL 104 102 104  CO2 25 28 23   BUN 7* 7* 8  CREATININE 0.90 0.98 0.95  CALCIUM 8.6* 8.9 8.6*  PROT 5.7*  --   --   BILITOT 1.5*  --   --   ALKPHOS 70  --   --   ALT 15  --   --   AST 19  --   --   GLUCOSE 76 82 69*   6/28 digoxin level <0.2 6/27 troponin I <0.03 6/27 free T4 WNL 6/26 urine culture >100,000 colonies of E. coli 6/26 A1c 5.8 6/26 TSH low, 0.140 6/26 phosphorus WNL 6/26 magnesium WNL 6/26 HIV antibody nonreactive 6/26 UA AC, moderate hemoglobin, many bacteria, positive nitrite, small leukocyte, 11-20 WBC 6/26 BNP 1988.2 6/26 INR 1.17  Imaging/Diagnostic Tests: ECHO COMPLETE WO IMAGING ENHANCING AGENT (08/06/2017) - Left ventricle: The cavity size was moderately dilated. Wall   thickness was increased in a pattern of mild LVH. Systolic   function was severely reduced. The estimated ejection fraction   was in the range of 20% to 25%. Diffuse hypokinesis. There is   akinesis of the inferior myocardium. Features are consistent with   a pseudonormal left ventricular filling pattern, with concomitant   abnormal relaxation and increased filling pressure (grade 2   diastolic dysfunction). - Aortic valve: Trileaflet; mildly thickened, mildly calcified   leaflets. - Mitral valve: There was moderate regurgitation. - Left atrium: The atrium was severely dilated. Volume/bsa, ES,    (1-plane Simpson&'s, A2C): 53.1 ml/m^2. - Right ventricle: Pacer wire or catheter noted in right ventricle. - Pulmonic valve: There was moderate regurgitation. - Pulmonary arteries: Systolic pressure was mildly increased. PA   peak pressure: 36 mm Hg (S).  CHEST - 2 VIEW (08/05/2017) No active cardiopulmonary disease.  CT HEAD WITHOUT CONTRAST (08/05/2017) No acute findings. Minimal chronic ischemic microvascular disease. 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    Rittberger, Bernita Raisin, DO 08/14/2017, 6:24 AM PGY-1, [place] Intern pager: [phone], text pages welcome

## 2017-08-14 NOTE — Progress Notes (Signed)
Patient discharge to home.Transported by [facility]. Sister took all pts belonging home. Unit secretary called sister prior to discharge  just to make sure someone is at home.

## 2017-08-14 NOTE — Progress Notes (Addendum)
8:30 am Attempted all to patient's daughter [name] no answer; TCT to Gwynneth Aliment (Sister); CM talked to Dominica concerning the difficulty in reaching the daughter. At discharge the patient will be at his home, his daughter and sister will be providing care. Lorriane Shire stated that she will go to Pam's home to inform her to call CM and to determine if she wants Lorriane Shire to be the contact person. Aneta Mins [phone]  10:00 am - VM left with Lorriane Shire, to see if she has talked to the daughter [facility] concerning home hospice. Aneta Mins [phone]  10:37 am [facility] non emergent [employer] called to do a wellness check in the home at [facility] and to contact the CM after the wellness check is completed. B Lamarco Gudiel RN,MHA,BSN  10:50 am Received call from Lorriane Shire (sister) she will be arriving to the hospital within the hour. Mindi Slicker RN,MHA,BSN  11:02 am - Received call from [facility] ( daughter) to offer home hospice choice, she chose [facility]; Referral made as requested. They are on their way to the hospital. B Joanann Mies RN,CM   12:12 pm - Hillary with [facility] to meet with pt/ daughter and sister today at 1:30 pm to discuss home hospice; B Jadie Allington RN,MHA,BSN   2:20 pm- CM talked to patient's sister Lorriane Shire at the bedside and his daughter [name] was on speaker phone; all questions answered; patient to be transported home via non emergent ambulance [facility]; home address verified. Hillary with [facility] at the bedside to talk about the services that they will provide at the home; B [facility]

## 2017-09-11 ENCOUNTER — Other Ambulatory Visit: Payer: Self-pay | Admitting: Family Medicine

## 2017-09-29 ENCOUNTER — Ambulatory Visit (INDEPENDENT_AMBULATORY_CARE_PROVIDER_SITE_OTHER): Payer: Medicare Other | Admitting: *Deleted

## 2017-09-29 DIAGNOSIS — I472 Ventricular tachycardia, unspecified: Secondary | ICD-10-CM

## 2017-09-29 DIAGNOSIS — I428 Other cardiomyopathies: Secondary | ICD-10-CM | POA: Diagnosis not present

## 2017-09-29 DIAGNOSIS — I429 Cardiomyopathy, unspecified: Secondary | ICD-10-CM

## 2017-09-29 NOTE — Progress Notes (Signed)
Remote ICD transmission.   

## 2017-11-02 LAB — CUP PACEART REMOTE DEVICE CHECK
Battery Remaining Longevity: 108 mo
Battery Voltage: 3 V
HighPow Impedance: 60 Ohm
Implantable Lead Implant Date: 20160329
Implantable Lead Location: 753860
Implantable Pulse Generator Implant Date: 20160329
Lead Channel Impedance Value: 342 Ohm
Lead Channel Pacing Threshold Amplitude: 0.75 V
Lead Channel Pacing Threshold Pulse Width: 0.4 ms
Lead Channel Sensing Intrinsic Amplitude: 2.375 mV
Lead Channel Setting Pacing Amplitude: 2 V
Lead Channel Setting Pacing Pulse Width: 0.4 ms
Lead Channel Setting Sensing Sensitivity: 0.3 mV
MDC IDC MSMT LEADCHNL RV IMPEDANCE VALUE: 418 Ohm
MDC IDC MSMT LEADCHNL RV SENSING INTR AMPL: 2.375 mV
MDC IDC SESS DTM: 20190820052405
MDC IDC STAT BRADY RV PERCENT PACED: 0 %

## 2017-12-29 ENCOUNTER — Ambulatory Visit (INDEPENDENT_AMBULATORY_CARE_PROVIDER_SITE_OTHER): Payer: PRIVATE HEALTH INSURANCE

## 2017-12-29 DIAGNOSIS — I472 Ventricular tachycardia, unspecified: Secondary | ICD-10-CM

## 2017-12-30 NOTE — Progress Notes (Signed)
Remote ICD transmission.   

## 2018-02-24 LAB — CUP PACEART REMOTE DEVICE CHECK
Battery Remaining Longevity: 105 mo
Battery Voltage: 3 V
Brady Statistic RV Percent Paced: 0 %
HIGH POWER IMPEDANCE MEASURED VALUE: 58 Ohm
Lead Channel Impedance Value: 399 Ohm
Lead Channel Impedance Value: 475 Ohm
Lead Channel Setting Pacing Amplitude: 2 V
Lead Channel Setting Pacing Pulse Width: 0.4 ms
Lead Channel Setting Sensing Sensitivity: 0.3 mV
MDC IDC LEAD IMPLANT DT: 20160329
MDC IDC LEAD LOCATION: 753860
MDC IDC MSMT LEADCHNL RV PACING THRESHOLD AMPLITUDE: 0.75 V
MDC IDC MSMT LEADCHNL RV PACING THRESHOLD PULSEWIDTH: 0.4 ms
MDC IDC MSMT LEADCHNL RV SENSING INTR AMPL: 3.125 mV
MDC IDC MSMT LEADCHNL RV SENSING INTR AMPL: 3.125 mV
MDC IDC PG IMPLANT DT: 20160329
MDC IDC SESS DTM: 20191119072823

## 2018-03-30 ENCOUNTER — Ambulatory Visit (INDEPENDENT_AMBULATORY_CARE_PROVIDER_SITE_OTHER)

## 2018-03-30 DIAGNOSIS — I5022 Chronic systolic (congestive) heart failure: Secondary | ICD-10-CM

## 2018-03-30 DIAGNOSIS — I255 Ischemic cardiomyopathy: Secondary | ICD-10-CM

## 2018-03-31 LAB — CUP PACEART REMOTE DEVICE CHECK
Battery Voltage: 3 V
Brady Statistic RV Percent Paced: 0 %
Date Time Interrogation Session: 20200218062202
HighPow Impedance: 57 Ohm
Implantable Lead Location: 753860
Lead Channel Impedance Value: 456 Ohm
Lead Channel Setting Pacing Amplitude: 2 V
Lead Channel Setting Sensing Sensitivity: 0.3 mV
MDC IDC LEAD IMPLANT DT: 20160329
MDC IDC MSMT BATTERY REMAINING LONGEVITY: 100 mo
MDC IDC MSMT LEADCHNL RV IMPEDANCE VALUE: 361 Ohm
MDC IDC MSMT LEADCHNL RV PACING THRESHOLD AMPLITUDE: 0.75 V
MDC IDC MSMT LEADCHNL RV PACING THRESHOLD PULSEWIDTH: 0.4 ms
MDC IDC MSMT LEADCHNL RV SENSING INTR AMPL: 2.875 mV
MDC IDC MSMT LEADCHNL RV SENSING INTR AMPL: 2.875 mV
MDC IDC PG IMPLANT DT: 20160329
MDC IDC SET LEADCHNL RV PACING PULSEWIDTH: 0.4 ms

## 2018-04-07 NOTE — Progress Notes (Signed)
Remote ICD transmission.   

## 2018-04-12 ENCOUNTER — Encounter: Payer: Self-pay | Admitting: Cardiology

## 2018-06-21 ENCOUNTER — Telehealth: Payer: Self-pay | Admitting: Cardiovascular Disease

## 2018-06-21 NOTE — Telephone Encounter (Signed)
Sister phone [phone] consent/ my chart via text/ pre reg completed

## 2018-06-22 ENCOUNTER — Encounter

## 2018-06-22 ENCOUNTER — Telehealth (INDEPENDENT_AMBULATORY_CARE_PROVIDER_SITE_OTHER): Admitting: Cardiovascular Disease

## 2018-06-22 ENCOUNTER — Encounter: Payer: Self-pay | Admitting: Cardiovascular Disease

## 2018-06-22 ENCOUNTER — Telehealth: Payer: Medicare Other | Admitting: Cardiovascular Disease

## 2018-06-22 VITALS — Ht 67.0 in | Wt 187.0 lb

## 2018-06-22 DIAGNOSIS — I5022 Chronic systolic (congestive) heart failure: Secondary | ICD-10-CM | POA: Diagnosis not present

## 2018-06-22 DIAGNOSIS — I4729 Other ventricular tachycardia: Secondary | ICD-10-CM

## 2018-06-22 DIAGNOSIS — E1159 Type 2 diabetes mellitus with other circulatory complications: Secondary | ICD-10-CM

## 2018-06-22 DIAGNOSIS — I25118 Atherosclerotic heart disease of native coronary artery with other forms of angina pectoris: Secondary | ICD-10-CM | POA: Diagnosis not present

## 2018-06-22 DIAGNOSIS — I472 Ventricular tachycardia: Secondary | ICD-10-CM | POA: Diagnosis not present

## 2018-06-22 DIAGNOSIS — Z95810 Presence of automatic (implantable) cardiac defibrillator: Secondary | ICD-10-CM

## 2018-06-22 DIAGNOSIS — F0391 Unspecified dementia with behavioral disturbance: Secondary | ICD-10-CM

## 2018-06-22 DIAGNOSIS — I2510 Atherosclerotic heart disease of native coronary artery without angina pectoris: Secondary | ICD-10-CM

## 2018-06-22 DIAGNOSIS — I10 Essential (primary) hypertension: Secondary | ICD-10-CM

## 2018-06-22 NOTE — Patient Instructions (Signed)
Medication Instructions:  Your physician recommends that you continue on your current medications as directed. Please refer to the Current Medication list given to you today.  If you need a refill on your cardiac medications before your next appointment, please call your pharmacy.   Follow-Up: At [facility], you and your health needs are our priority.  As part of our continuing mission to provide you with exceptional heart care, we have created designated Provider Care Teams.  These Care Teams include your primary Cardiologist (physician) and Advanced Practice Providers (APPs -  Physician Assistants and Nurse Practitioners) who all work together to provide you with the care you need, when you need it. You will need a follow up appointment in 1 year.  Please call our office 2 months in advance to schedule this appointment.  You may see Sanda Klein, MD or one of the following Advanced Practice Providers on your designated Care Team: [place], [place] . Fabian Sharp, PA-C  Any Other Special Instructions Will Be Listed Below (If Applicable). None

## 2018-06-22 NOTE — Progress Notes (Signed)
Virtual Visit via Video Note   This visit type was conducted due to national recommendations for restrictions regarding the COVID-19 Pandemic (e.g. social distancing) in an effort to limit this patient's exposure and mitigate transmission in our community.  Due to his co-morbid illnesses, this patient is at least at moderate risk for complications without adequate follow up.  This format is felt to be most appropriate for this patient at this time.  All issues noted in this document were discussed and addressed.  A limited physical exam was performed with this format.  Please refer to the patient's chart for his consent to telehealth for [facility].   Date:  06/22/2018   ID:  CURRAN LENDERMAN, DOB 1945-05-26, MRN [id number]  Patient Location: Home Provider Location: Home  PCP:  Coy Saunas, MD  Cardiologist:  Constantinos Krempasky Electrophysiologist:  None   Evaluation Performed:  Follow-Up Visit  Chief Complaint:  CHF, CAD  History of Present Illness:    Ronnie Nelson is a 73 y.o. male with a longstanding history of coronary artery disease, previous bypass surgery and congestive heart failure with reduced left ventricular systolic function, history of stroke, dementia which has progressed to the point that he is essentially bedridden and under hospice care.    He had a significant decline approximately 18 months ago when his wife passed.  Not long after that he had a stroke.  After a hospitalization in July 2019 he has been essentially bedridden and his sister Lorriane Shire cares for him.  She provides virtually the whole review of systems.  His appetite has improved after the addition of dexamethasone.  Sometimes he makes sense when he speaks other times not.  Generally he is oriented to self, but not to time or circumstances.  About a week ago he had an episode of chest pain that resolved spontaneously.  He was evaluated by the hospice staff and felt to be stable.  Jeson is no longer taking any type  of diuretics.  Lorriane Shire has reported that he does not have edema or any evidence of difficulty breathing when lying flat in bed.  She has not seen him experience syncope and he has not complained of palpitations.  She says that he often has drenching sweats and she has to change his bed sheets frequently.  He has a defibrillator in place but the tachycardia therapies have been turned off due to his palliative care status.  He does not require pacing.  Battery status and lead parameters are normal.  Activity level has been 0 since July 2019.  Over the last several weeks his thoracic impedance levels have not shown signs of fluid overload.  He has not had any episodes of ventricular tachycardia.  The patient does not have symptoms concerning for COVID-19 infection (fever, chills, cough, or new shortness of breath).    Past Medical History:  Diagnosis Date  . CAD (coronary artery disease)    a. CABG 1996: SVG to LAD,LIMA to CX,SVG to RCA  . DM (diabetes mellitus) ([place])   . HTN (hypertension)   . Hyperlipidemia   . ICD (implantable cardioverter-defibrillator) in place    a. Generator Medtronic Evera MRI [place] VR model [id number] serial number [id number]  . Ischemic cardiomyopathy    a.s/p ICD placement   Past Surgical History:  Procedure Laterality Date  . CARDIAC CATHETERIZATION  11/14/1998   severe depressed LV systolic function EF 18-29%  . CARDIAC DEFIBRILLATOR PLACEMENT  06/08/07   Medtronic  . CORONARY  ARTERY BYPASS GRAFT  1996   [facility]  . IMPLANTABLE CARDIOVERTER DEFIBRILLATOR GENERATOR CHANGE N/A 05/09/2014   Procedure: IMPLANTABLE CARDIOVERTER DEFIBRILLATOR GENERATOR CHANGE;  Surgeon: Sanda Klein, MD;  Location: [facility] CATH [facility];  Service: Cardiovascular;  Laterality: N/A;  . NM MYOCAR PERF WALL MOTION  11/21/2011   no significant ischemia     Current Meds  Medication Sig  . acetaminophen (TYLENOL) 325 MG tablet Take 2 tablets (650 mg total) by mouth every 6 (six) hours as needed  for mild pain (or Fever >/= 101).  . carvedilol (COREG) 6.25 MG tablet Take 1 tablet (6.25 mg total) by mouth 2 (two) times daily with a meal.  . dexamethasone (DECADRON) 4 MG tablet Take 1 tablet by mouth daily.  . digoxin (LANOXIN) 0.125 MG tablet Take 0.0625 mg by mouth daily.   . nitroGLYCERIN (NITROSTAT) 0.4 MG SL tablet Place 0.4 mg under the tongue every 5 (five) minutes as needed for chest pain.  Marland Kitchen omeprazole (PRILOSEC) 20 MG capsule Take 20 mg by mouth 2 (two) times daily.   . polyethylene glycol (MIRALAX / GLYCOLAX) 17 g packet Take 1 packet by mouth as directed.  . risperiDONE (RISPERDAL) 1 MG/ML oral solution Take 1 mg by mouth daily.  . sertraline (ZOLOFT) 100 MG tablet Take 100 mg by mouth daily.  . temazepam (RESTORIL) 15 MG capsule Take 15 mg by mouth once.     Allergies:   Patient has no known allergies.   Social History   Tobacco Use  . Smoking status: Former Smoker    Last attempt to quit: 02/10/1995    Years since quitting: 23.3  . Smokeless tobacco: Never Used  Substance Use Topics  . Alcohol use: No  . Drug use: No     Family Hx: The patient's Family history is unknown by patient.  ROS:   Please see the history of present illness.    Limited due to patient's condition All other systems reviewed and are negative.   Prior CV studies:   The following studies were reviewed today:  Comprehensive defibrillator download (most recent March 31, 2018) shows normal battery lead status, stable thoracic impedance in the absence of pacing or ventricular tachyarrhythmia.  ECHO  Left ventricle: The cavity size was moderately dilated. Wallthickness was increased in a pattern of mild LVH. Systolicfunction was severely reduced. The estimated ejection fractionwas in the range of 20% to 25%. Diffuse hypokinesis. There isakinesis of the inferior myocardium. Features are consistent witha pseudonormal left ventricular filling pattern, with concomitantabnormal  relaxation and increased filling pressure (grade 2diastolic dysfunction). - Aortic valve: Trileaflet; mildly thickened, mildly calcified leaflets. - Mitral valve: There was moderate regurgitation. - Left atrium: The atrium was severely dilated. Volume/bsa, ES,(1-plane Simpson&'s, A2C): 53.1 ml/m^2. - Right ventricle: Pacer wire or catheter noted in right ventricle. - Pulmonic valve: There was moderate regurgitation. - Pulmonary arteries: Systolic pressure was mildly increased. PApeak pressure: 36 mm Hg (S). Labs/Other Tests and Data Reviewed:    EKG:  intracardiac electrogram shows regular rhythm with frequent PVCs  Recent Labs: 08/05/2017: B Natriuretic Peptide 1,988.2; Magnesium 1.9; TSH 0.140 08/09/2017: ALT 15 08/12/2017: BUN 8; Creatinine, Ser 0.95; Hemoglobin 14.6; Platelets 215; Potassium 4.3; Sodium 137   Recent Lipid Panel No results found for: CHOL, TRIG, HDL, CHOLHDL, LDLCALC, LDLDIRECT  Wt Readings from Last 3 Encounters:  08/14/17 174 lb (78.9 kg)  06/24/17 155 lb 13.8 oz (70.7 kg)  01/14/17 182 lb (82.6 kg)     Objective:  Vital Signs:  Ht 5\' 7"  (1.702 m)   Wt 187 lb (84.8 kg)   BMI 29.29 kg/m    VITAL SIGNS:  reviewed GEN:  no acute distress RESPIRATORY:  normal respiratory effort, symmetric expansion Very limited evaluation possible due to poor video signal  ASSESSMENT & PLAN:    1. CHF: Appears to be compensated.  Not on loop diuretics.  No evidence of dyspnea at rest/orthopnea, stable thoracic impedance by defibrillator monitoring. 2. CAD:-Episode of chest discomfort at rest with no, none since.  Instructed his caregiver today that she can administer sublingual nitroglycerin 1 tablet every 5 minutes as needed for chest pain. 3. HTN: Unable to measure today. 4. Dementia: Presumably vascular in etiology although CT head shows "minimal chronic ischemic microvascular disease.  Small old left occipital infarct".  Under hospice care. 5. ICD: Tachycardia  detection and therapies were turned off.  The device still offers some use for monitoring of arrhythmia and evidence of fluid overload. 6. Diaphoresis: He has no medicines for diabetes so I do not think this can be attributed to hypoglycemia.  I wonder if it is related to his chronic steroid therapy and whether this is still of benefit.  COVID-19 Education: The signs and symptoms of COVID-19 were discussed with the patient and how to seek care for testing (follow up with PCP or arrange E-visit).  The importance of social distancing was discussed today.  Time:   Today, I have spent 15 minutes with the patient with telehealth technology discussing the above problems.     Medication Adjustments/Labs and Tests Ordered: Current medicines are reviewed at length with the patient today.  Concerns regarding medicines are outlined above.   Tests Ordered: No orders of the defined types were placed in this encounter.   Medication Changes: No orders of the defined types were placed in this encounter.   Disposition:  Follow up 12 months  Signed, Sanda Klein, MD  06/22/2018 8:19 AM    [place]

## 2018-06-29 ENCOUNTER — Ambulatory Visit (INDEPENDENT_AMBULATORY_CARE_PROVIDER_SITE_OTHER): Admitting: *Deleted

## 2018-06-29 ENCOUNTER — Other Ambulatory Visit: Payer: Self-pay

## 2018-06-29 DIAGNOSIS — I5022 Chronic systolic (congestive) heart failure: Secondary | ICD-10-CM | POA: Diagnosis not present

## 2018-06-29 DIAGNOSIS — I255 Ischemic cardiomyopathy: Secondary | ICD-10-CM

## 2018-06-30 LAB — CUP PACEART REMOTE DEVICE CHECK
Battery Remaining Longevity: 96 mo
Battery Voltage: 3 V
Brady Statistic RV Percent Paced: 0 %
Date Time Interrogation Session: 20200519062823
HighPow Impedance: 63 Ohm
Implantable Lead Implant Date: 20160329
Implantable Lead Location: 753860
Implantable Pulse Generator Implant Date: 20160329
Lead Channel Impedance Value: 418 Ohm
Lead Channel Impedance Value: 513 Ohm
Lead Channel Pacing Threshold Amplitude: 0.75 V
Lead Channel Pacing Threshold Pulse Width: 0.4 ms
Lead Channel Sensing Intrinsic Amplitude: 3.375 mV
Lead Channel Sensing Intrinsic Amplitude: 3.375 mV
Lead Channel Setting Pacing Amplitude: 2 V
Lead Channel Setting Pacing Pulse Width: 0.4 ms
Lead Channel Setting Sensing Sensitivity: 0.3 mV

## 2018-07-08 ENCOUNTER — Encounter: Payer: Self-pay | Admitting: Cardiology

## 2018-07-08 NOTE — Progress Notes (Signed)
Remote ICD transmission.   

## 2018-09-22 IMAGING — CT CT HEAD W/O CM
4 series · 16 of 47 positions shown, 18 images · non-contrast
Comparison: 06/19/2017 and 05/16/2017

CLINICAL DATA: Increased mental status and slurred speech with
chest pain beginning this afternoon.

EXAM:
CT HEAD WITHOUT CONTRAST
TECHNIQUE: Contiguous axial images were obtained from the base of the skull
through the vertex without intravenous contrast.

[Series 3: head wo · axial · 0.45mm/px · z∈[-90,+35]mm · 6 of 37 slices shown, 8 images]
[im 6/37  brain]
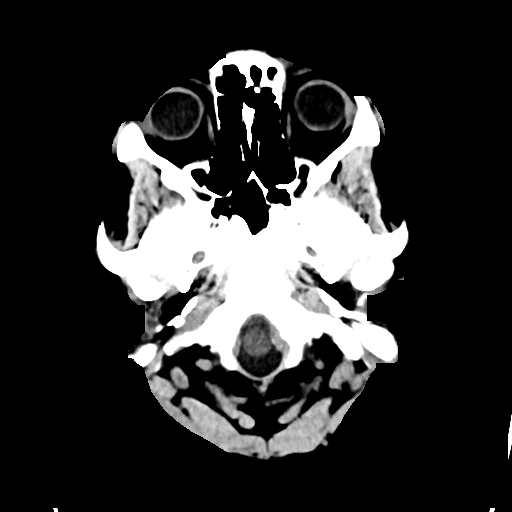
[im 6/37  bone]
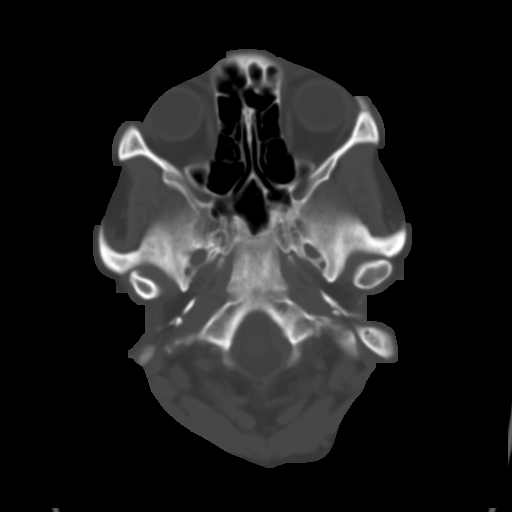
[im 11/37  brain]
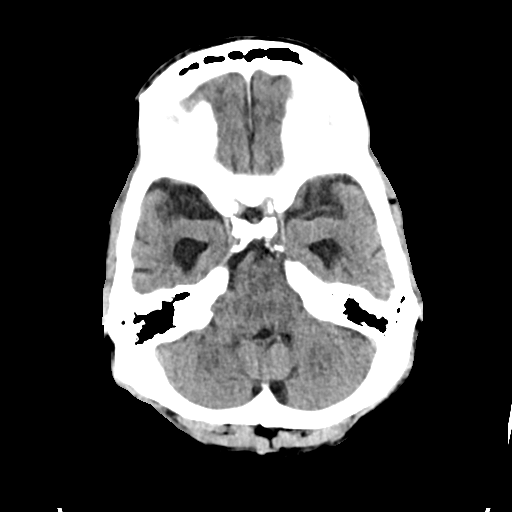
[im 16/37  brain]
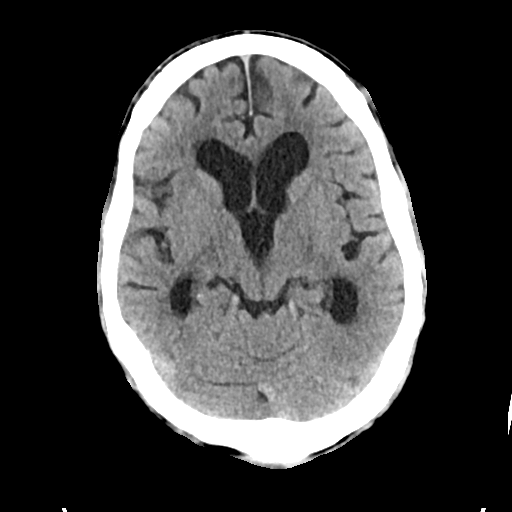
[im 21/37  brain]
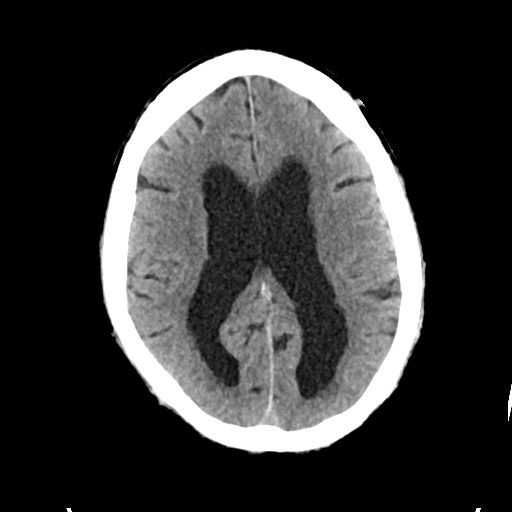
[im 26/37  brain]
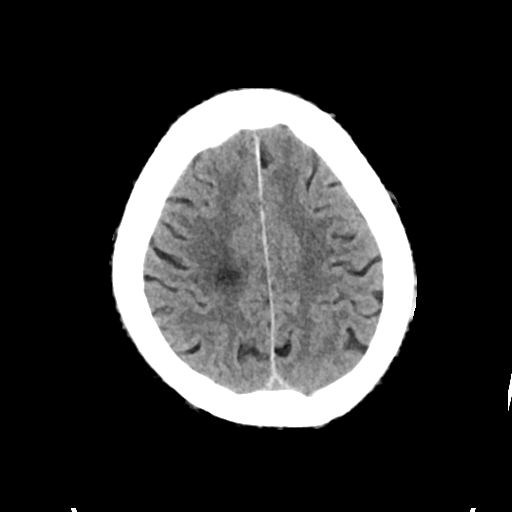
[im 26/37  bone]
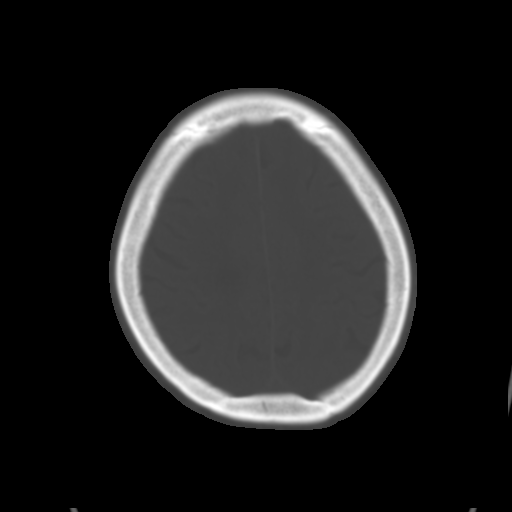
[im 31/37  brain]
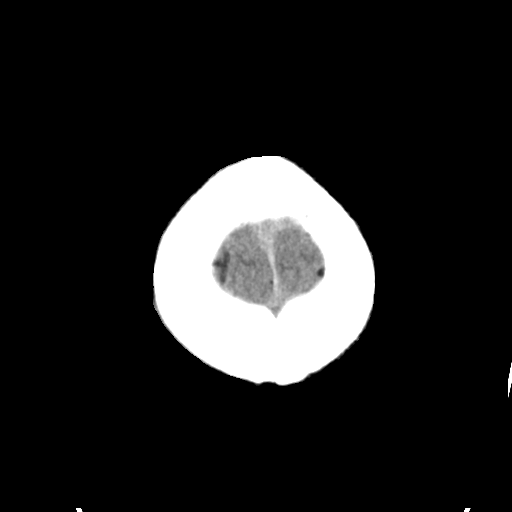

[Series 4: head bone · axial · 0.45mm/px · z∈[-99,-35]mm · 4 of 95 slices shown]
[im 9/95  bone]
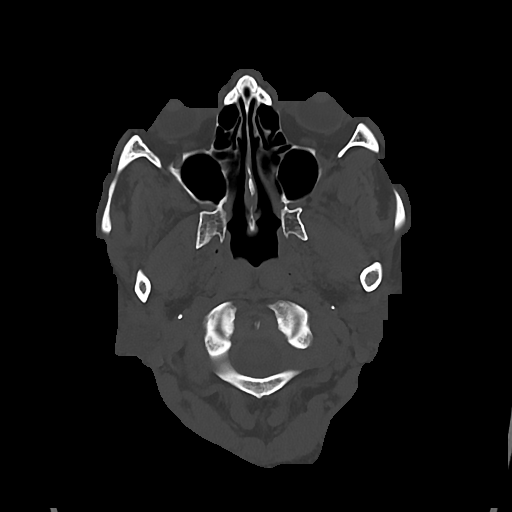
[im 18/95  bone]
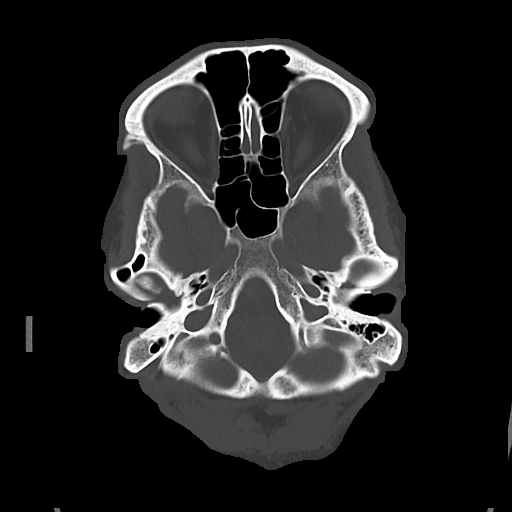
[im 32/95  bone]
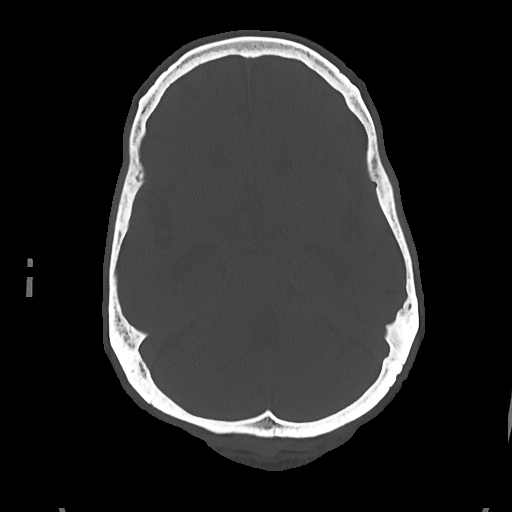
[im 41/95  bone]
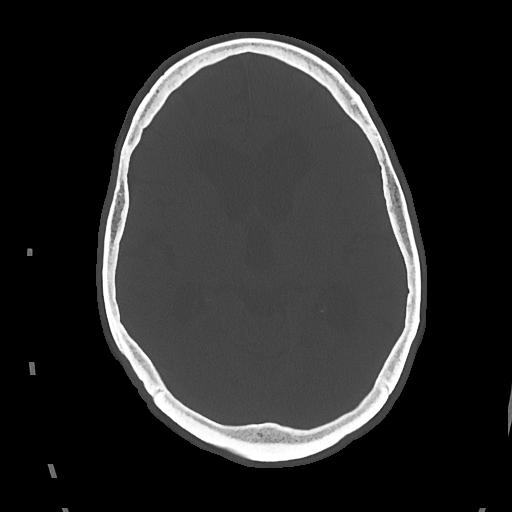

[Series 5: cor soft · coronal · 0.38mm/px · 3 of 76 slices shown]
[im 26/76  brain]
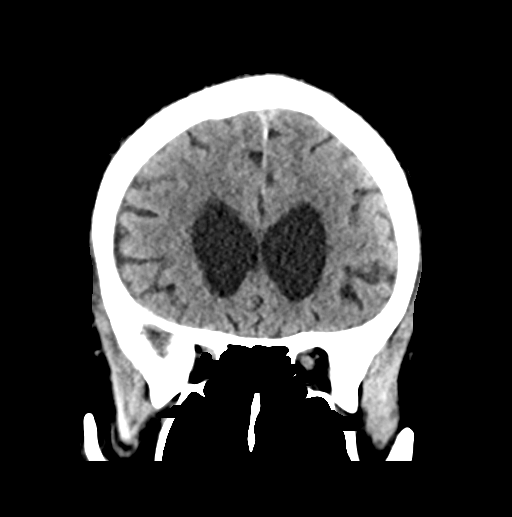
[im 34/76  brain]
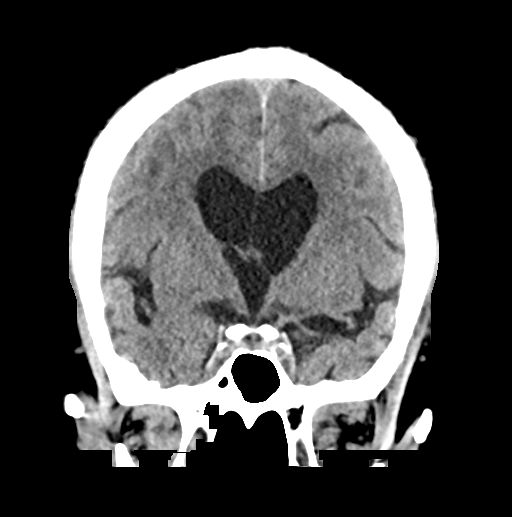
[im 42/76  brain]
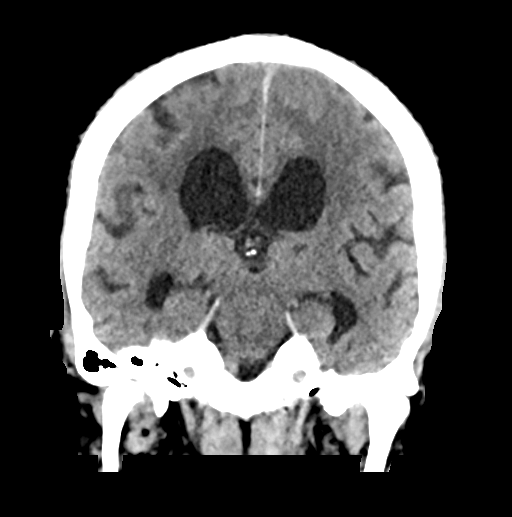

[Series 6: sag soft · sagittal · 0.37mm/px · 3 of 65 slices shown]
[im 22/65  brain]
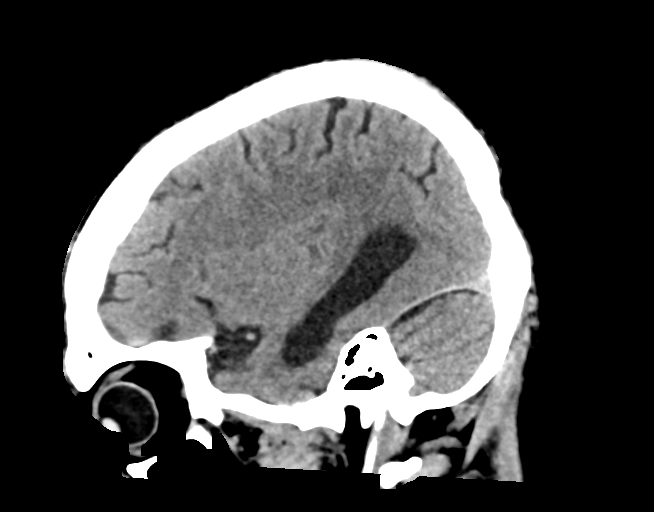
[im 33/65  brain]
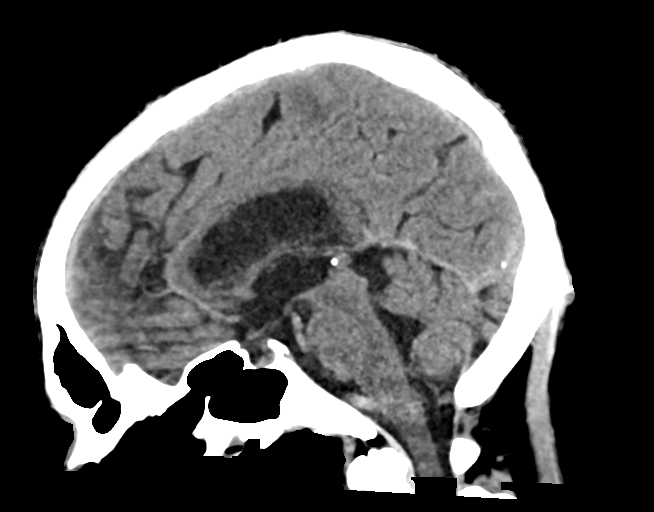
[im 43/65  brain]
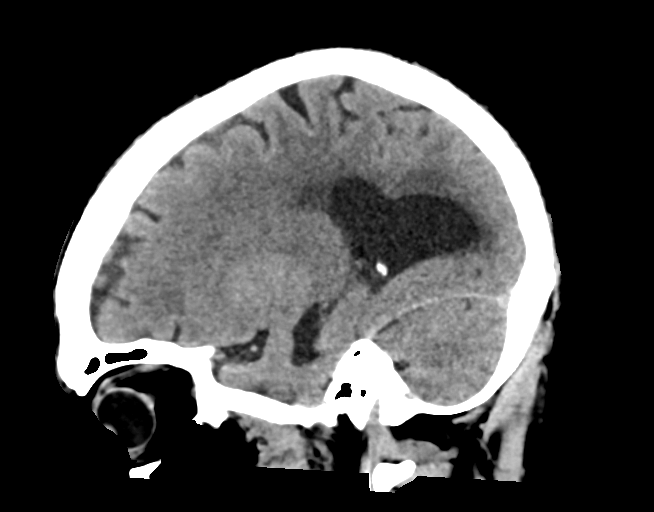

[16 of 47 positions shown; findings below may reference images not displayed]

FINDINGS: Brain: Examination demonstrates mild stable prominence of the
lateral and third ventricles. Cisterns are within normal. There is
minimal chronic ischemic microvascular disease. Small old left
occipital infarct. No evidence of intra-axial mass, mass effect,
shift of midline structures or acute hemorrhage. No evidence of
acute infarction. No change over the region of the right cerebellar
pontine angle with there is a known mass likely schwannoma with
widening of the adjacent internal auditory canal.

Vascular: No hyperdense vessel or unexpected calcification.

Skull: Normal. Negative for fracture or focal lesion.

Sinuses/Orbits: No acute finding.

Other: None.
IMPRESSION: No acute findings.

Minimal chronic ischemic microvascular disease. Small old left
occipital infarct.

Stable known right CP angle mass.

## 2018-09-28 ENCOUNTER — Ambulatory Visit (INDEPENDENT_AMBULATORY_CARE_PROVIDER_SITE_OTHER): Admitting: *Deleted

## 2018-09-28 DIAGNOSIS — I255 Ischemic cardiomyopathy: Secondary | ICD-10-CM

## 2018-09-28 DIAGNOSIS — I5022 Chronic systolic (congestive) heart failure: Secondary | ICD-10-CM | POA: Diagnosis not present

## 2018-09-28 LAB — CUP PACEART REMOTE DEVICE CHECK
Battery Remaining Longevity: 91 mo
Battery Voltage: 3 V
Brady Statistic RV Percent Paced: 0 %
Date Time Interrogation Session: 20200818073324
HighPow Impedance: 60 Ohm
Implantable Lead Implant Date: 20160329
Implantable Lead Location: 753860
Implantable Pulse Generator Implant Date: 20160329
Lead Channel Impedance Value: 475 Ohm
Lead Channel Impedance Value: 551 Ohm
Lead Channel Pacing Threshold Amplitude: 0.625 V
Lead Channel Pacing Threshold Pulse Width: 0.4 ms
Lead Channel Sensing Intrinsic Amplitude: 2.25 mV
Lead Channel Sensing Intrinsic Amplitude: 2.25 mV
Lead Channel Setting Pacing Amplitude: 2 V
Lead Channel Setting Pacing Pulse Width: 0.4 ms
Lead Channel Setting Sensing Sensitivity: 0.3 mV

## 2018-10-07 ENCOUNTER — Encounter: Payer: Self-pay | Admitting: Cardiology

## 2018-10-07 NOTE — Progress Notes (Signed)
Remote ICD transmission.   

## 2018-11-11 DEATH — deceased

## 2018-12-28 ENCOUNTER — Encounter: Payer: Medicaid Other | Admitting: *Deleted

## 2018-12-30 ENCOUNTER — Telehealth: Payer: Self-pay

## 2018-12-30 NOTE — Telephone Encounter (Signed)
Spoke with patient to remind of missed remote transmission
